# Patient Record
Sex: Male | Born: 1955 | Race: White | Hispanic: No | Marital: Married | Smoking: Never smoker
Health system: Southern US, Academic
[De-identification: ages and names within clinical notes are randomized; demographics above are authoritative.]

## PROBLEM LIST (undated history)

## (undated) DIAGNOSIS — K760 Fatty (change of) liver, not elsewhere classified: Secondary | ICD-10-CM

## (undated) DIAGNOSIS — K219 Gastro-esophageal reflux disease without esophagitis: Secondary | ICD-10-CM

## (undated) DIAGNOSIS — R42 Dizziness and giddiness: Secondary | ICD-10-CM

## (undated) DIAGNOSIS — E785 Hyperlipidemia, unspecified: Secondary | ICD-10-CM

## (undated) DIAGNOSIS — N400 Enlarged prostate without lower urinary tract symptoms: Secondary | ICD-10-CM

## (undated) DIAGNOSIS — K7460 Unspecified cirrhosis of liver: Secondary | ICD-10-CM

## (undated) DIAGNOSIS — C801 Malignant (primary) neoplasm, unspecified: Secondary | ICD-10-CM

## (undated) DIAGNOSIS — N411 Chronic prostatitis: Secondary | ICD-10-CM

## (undated) DIAGNOSIS — E669 Obesity, unspecified: Secondary | ICD-10-CM

## (undated) DIAGNOSIS — N4340 Spermatocele of epididymis, unspecified: Secondary | ICD-10-CM

## (undated) DIAGNOSIS — R55 Syncope and collapse: Secondary | ICD-10-CM

## (undated) DIAGNOSIS — E119 Type 2 diabetes mellitus without complications: Secondary | ICD-10-CM

## (undated) DIAGNOSIS — D696 Thrombocytopenia, unspecified: Secondary | ICD-10-CM

## (undated) DIAGNOSIS — Z9989 Dependence on other enabling machines and devices: Secondary | ICD-10-CM

## (undated) DIAGNOSIS — G4730 Sleep apnea, unspecified: Secondary | ICD-10-CM

## (undated) DIAGNOSIS — I10 Essential (primary) hypertension: Secondary | ICD-10-CM

## (undated) DIAGNOSIS — G20A1 Parkinson's disease without dyskinesia, without mention of fluctuations: Secondary | ICD-10-CM

## (undated) HISTORY — DX: Parkinson's disease without dyskinesia, without mention of fluctuations: G20.A1

## (undated) HISTORY — PX: HX HAND SURGERY: 2100001299

## (undated) HISTORY — PX: HX COLONOSCOPY: 2100001147

## (undated) HISTORY — DX: Unspecified cirrhosis of liver: K74.60

## (undated) HISTORY — DX: Gastro-esophageal reflux disease without esophagitis: K21.9

## (undated) HISTORY — DX: Benign prostatic hyperplasia without lower urinary tract symptoms: N40.0

## (undated) HISTORY — DX: Thrombocytopenia, unspecified: D69.6

## (undated) HISTORY — DX: Fatty (change of) liver, not elsewhere classified: K76.0

## (undated) HISTORY — PX: HX HERNIA REPAIR: SHX51

## (undated) HISTORY — DX: Spermatocele of epididymis, unspecified: N43.40

## (undated) HISTORY — PX: CARPAL TUNNEL RELEASE: SHX101

## (undated) HISTORY — DX: Type 2 diabetes mellitus without complications: E11.9

## (undated) HISTORY — PX: TONSILLECTOMY: SUR1361

## (undated) HISTORY — DX: Obesity, unspecified: E66.9

## (undated) HISTORY — PX: MOUTH SURGERY: SHX715

## (undated) HISTORY — PX: BASAL CELL CARCINOMA EXCISION: SHX1214

## (undated) HISTORY — PX: HERNIA REPAIR: SHX51

## (undated) HISTORY — DX: Malignant (primary) neoplasm, unspecified: C80.1

## (undated) HISTORY — DX: Chronic prostatitis: N41.1

## (undated) HISTORY — DX: Syncope and collapse: R55

---

## 2005-12-03 ENCOUNTER — Ambulatory Visit (HOSPITAL_BASED_OUTPATIENT_CLINIC_OR_DEPARTMENT_OTHER): Admission: RE | Admit: 2005-12-03 | Discharge: 2005-12-03 | Payer: Self-pay | Admitting: Orthopedic Surgery

## 2006-02-04 ENCOUNTER — Ambulatory Visit (HOSPITAL_BASED_OUTPATIENT_CLINIC_OR_DEPARTMENT_OTHER): Admission: RE | Admit: 2006-02-04 | Discharge: 2006-02-04 | Payer: Self-pay | Admitting: Orthopedic Surgery

## 2007-08-13 ENCOUNTER — Emergency Department (HOSPITAL_COMMUNITY): Admission: EM | Admit: 2007-08-13 | Discharge: 2007-08-13 | Payer: Self-pay | Admitting: Emergency Medicine

## 2011-03-13 NOTE — Op Note (Signed)
NAMEMILO, SCHREIER                ACCOUNT NO.:  [id number]   MEDICAL RECORD NO.:  [id number]          PATIENT TYPE:  AMB   LOCATION:  DSC                          FACILITY:  [facility]   PHYSICIAN:  Cindee Salt, M.D.       DATE OF BIRTH:  Feb 20, 1956   DATE OF PROCEDURE:  12/03/2005  DATE OF DISCHARGE:                                 OPERATIVE REPORT   PREOPERATIVE DIAGNOSIS:  Carpal tunnel syndrome left hand.   POSTOPERATIVE DIAGNOSIS:  Carpal tunnel syndrome left hand.   OPERATION:  Decompression left median nerve.   SURGEON:  Kuzma   ASSISTANT:  Inman R.N.   ANESTHESIA:  Forearm based IV regional.   HISTORY:  The patient is a 55 year old male with a history of carpal tunnel  syndrome EMG nerve conductions positive, not responsive to conservative  treatment.   PROCEDURE:  The patient is brought to the operating room after discussion,  marking the extremity by both the patient and surgeon. A forearm based IV  regional anesthetic was carried out without difficulty. He was prepped using  DuraPrep, supine position, left arm free. A longitudinal incision was made  in the palm, carried down through subcutaneous tissue. Bleeders were  electrocauterized, palmar fascia was split, superficial palmar arch  identified, flexor tendon of the ring and little finger identified to the  ulnar side of median nerve. Carpal retinaculum was incised with sharp  dissection. A right angle and Sewall retractor placed between skin and  forearm fascia. The fascia was released for approximately a centimeter and a  half proximal to the wrist crease under direct vision. Canal was explored.  Tenosynovial tissue was thickened. Area of compression to the nerve was  apparent. No further lesions were identified. The wound was irrigated. The  skin was closed interrupted 5-0 nylon sutures.  A sterile compressive  dressing and splint was applied. The patient tolerated the procedure well  was taken to the recovery  observation in satisfactory condition. He is  discharged home to return to the [facility] in 1 week on  Vicodin.           ______________________________  Cindee Salt, M.D.     GK/MEDQ  D:  12/03/2005  T:  12/03/2005  Job:  [id number]

## 2011-03-13 NOTE — Op Note (Signed)
NAMECAROLOS, FECHER                ACCOUNT NO.:  [id number]   MEDICAL RECORD NO.:  [id number]          PATIENT TYPE:  AMB   LOCATION:  DSC                          FACILITY:  [facility]   PHYSICIAN:  Cindee Salt, M.D.       DATE OF BIRTH:  26-Sep-1956   DATE OF PROCEDURE:  02/04/2006  DATE OF DISCHARGE:                                 OPERATIVE REPORT   PREOPERATIVE DIAGNOSIS:  Carpal tunnel syndrome right hand.   POSTOPERATIVE DIAGNOSIS:  Carpal tunnel syndrome right hand.   OPERATION:  Decompression, right median nerve.   SURGEON:  Cindee Salt, M.D.   ASSISTANT:  None.   ANESTHESIA:  Forearm-based IV regional.   HISTORY:  The patient is a 55 year old male with a history of carpal tunnel  syndrome.  EMG and nerve conductions positive.  This has not responded to  conservative treatment.  He is aware of risks and complications of surgery.  Questions were answered in the preoperative area.  The arm marked by patient  and surgeon.   PROCEDURE:  The patient was brought to the operating room where a forearm-  based IV regional anesthetic was carried out without difficulty.  He was  prepped using DuraPrep, supine position, right arm free.  A longitudinal  incision was made in the palm and carried down through subcutaneous tissue.  Bleeders were electrocauterized.  Palmar fascia was split.  Superficial  palmar arch identified.  The flexor tendon to the ring and little finger  identified.  To the ulnar side of median nerve, the carpal retinaculum was  incised with sharp dissection.  Right angle and Sewall retractor were placed  between skin and forearm fascia.  Fascia was released for approximately a  centimeter and a half proximal to the wrist crease under direct vision.  The  canal was explored and this was found be extremely tight.  No further  lesions were identified.  Tenosynovial tissue was moderately thickened. The  area of compression of the nerve was apparent.  The wound was  irrigated.  Skin was then closed with interrupted 5-0 nylon sutures.  Sterile  compressive dressing and splint was applied.  The patient tolerated the  procedure well was taken to the recovery room for observation in  satisfactory condition.   DISCHARGE SUMMARY:  He is discharged home to return to the [facility] in 1 week on Vicodin.           ______________________________  Cindee Salt, M.D.     GK/MEDQ  D:  02/04/2006  T:  02/04/2006  Job:  [id number]

## 2011-05-08 ENCOUNTER — Emergency Department (HOSPITAL_BASED_OUTPATIENT_CLINIC_OR_DEPARTMENT_OTHER)
Admission: EM | Admit: 2011-05-08 | Discharge: 2011-05-09 | Disposition: A | Discharge status: Other Acute Inpatient Hospital | Payer: BC Managed Care – PPO | Source: Home / Self Care | Attending: Emergency Medicine | Admitting: Emergency Medicine

## 2011-05-08 ENCOUNTER — Encounter: Payer: Self-pay | Admitting: *Deleted

## 2011-05-08 DIAGNOSIS — W07XXXA Fall from chair, initial encounter: Secondary | ICD-10-CM | POA: Insufficient documentation

## 2011-05-08 DIAGNOSIS — S1093XA Contusion of unspecified part of neck, initial encounter: Secondary | ICD-10-CM | POA: Insufficient documentation

## 2011-05-08 DIAGNOSIS — R404 Transient alteration of awareness: Secondary | ICD-10-CM | POA: Insufficient documentation

## 2011-05-08 DIAGNOSIS — S0003XA Contusion of scalp, initial encounter: Secondary | ICD-10-CM | POA: Insufficient documentation

## 2011-05-08 DIAGNOSIS — Y92009 Unspecified place in unspecified non-institutional (private) residence as the place of occurrence of the external cause: Secondary | ICD-10-CM | POA: Insufficient documentation

## 2011-05-08 DIAGNOSIS — R55 Syncope and collapse: Secondary | ICD-10-CM

## 2011-05-08 DIAGNOSIS — S0120XA Unspecified open wound of nose, initial encounter: Secondary | ICD-10-CM | POA: Insufficient documentation

## 2011-05-08 DIAGNOSIS — IMO0002 Reserved for concepts with insufficient information to code with codable children: Secondary | ICD-10-CM | POA: Insufficient documentation

## 2011-05-08 HISTORY — DX: Hyperlipidemia, unspecified: E78.5

## 2011-05-08 NOTE — ED Notes (Signed)
Pt c/o syncopal episode from sitting with laceration to nose.

## 2011-05-08 NOTE — ED Provider Notes (Signed)
History     Chief Complaint  Patient presents with  . Loss of Consciousness  . Laceration   HPI Comments: Patient presents with complaint of syncope. He was at home on the computer when he had acute onset of laughing it is something that he was watching on the computer screen. This was followed I find himself on the floor with a cut on his nose and some blood on the floor. He does admit to a history of having cough syncope which has happened at least 2 times in the past. His wife has accompanied him to the emergency department and states that she was in the room with 10 seconds of the sound of him falling. She found him awake and trying to get onto the bed. He had normal mental status no headache and no prodromal symptoms. This includes no fevers, chills, nausea, vomiting, chest pain, palpitations, shortness of breath, swelling, rash, numbness, weakness. He denies starting any new medications. 6 it was acute in onset, severe, resolved completely in a spontaneous fashion. He does note he has a small superficial laceration to bridge of his nose and an abrasion to his left forehead. He has not been worked up for syncope in the past by his family doctor.   Past Medical History  Diagnosis Date  . Hyperlipidemia     Past Surgical History  Procedure Date  . Hernia repair   . Tonsillectomy   . Carpal tunnel release     History reviewed. No pertinent family history.  History  Substance Use Topics  . Smoking status: Never Smoker   . Smokeless tobacco: Not on file  . Alcohol Use: No      Review of Systems  All other systems reviewed and are negative.    Physical Exam  BP 129/80  Pulse 88  Temp(Src) 98.5 F (36.9 C) (Oral)  Resp 18  Wt 250 lb (113.399 kg)  SpO2 100%  Physical Exam  Nursing note and vitals reviewed. Constitutional: He appears well-developed and well-nourished. No distress.  HENT:  Head: Normocephalic and atraumatic.  Mouth/Throat: Oropharynx is clear and  moist. No oropharyngeal exudate.       Superficial laceration to the bridge of the nose without gaping. Abrasion and small contusion to the left for head.  Eyes: Conjunctivae and EOM are normal. Pupils are equal, round, and reactive to light. Right eye exhibits no discharge. Left eye exhibits no discharge. No scleral icterus.       Normal pupils and extraocular movements.  Neck: Normal range of motion. Neck supple. No JVD present. No thyromegaly present.  Cardiovascular: Normal rate, regular rhythm, normal heart sounds and intact distal pulses.  Exam reveals no gallop and no friction rub.   No murmur heard. Pulmonary/Chest: Effort normal and breath sounds normal. No respiratory distress. He has no wheezes. He has no rales.  Abdominal: Soft. Bowel sounds are normal. He exhibits no distension and no mass. There is no tenderness.  Musculoskeletal: Normal range of motion. He exhibits no edema and no tenderness.  Lymphadenopathy:    He has no cervical adenopathy.  Neurological: He is alert. Coordination normal.       Normal sensation, speech, motor, coordination, gait.  Skin: Skin is warm and dry. No rash noted. He is not diaphoretic. No erythema.  Psychiatric: He has a normal mood and affect. His behavior is normal.    ED Course  LACERATION REPAIR Performed by: Vida Roller Authorized by: Eber Hong D Consent: Verbal consent obtained.  Risks and benefits: risks, benefits and alternatives were discussed Consent given by: patient Location: bridge of nose. Wound length (cm): 0.5 cm. Contaminated: No. Foreign bodies: no foreign bodies Nerve involvement: none Vascular damage: no Anesthesia method: None. Patient sedated: no Irrigation solution: saline Irrigation method: syringe Amount of cleaning: standard Debridement: none Degree of undermining: none Skin closure: glue (Dermabond) Approximation: close Approximation difficulty: simple Patient tolerance: Patient tolerated the  procedure well with no immediate complications.    MDM Patient had acute onset of what appears to be syncope which was situational. He has history of same. He has not been worked up for any sources of syncope in the past, thus I think it exceeded the work up other sources at this time including cardiac electrolytes and hematologic. Labs pending, EKG normal. Will repair nose with Dermabond only.  ED ECG REPORT   Date: 05/09/2011 - 22:26 pm  Rate: 75  Rhythm: normal sinus rhythm  QRS Axis: normal  Intervals: normal  ST/T Wave abnormalities: normal  Conduction Disutrbances:none  Narrative Interpretation:   Old EKG Reviewed: none available  Patient has an elevated CK-MB 12.5. His total CK is 890. Troponin is not detected. Will repeat these blood tests as patient has normal EKG. He is currently asymptomatic at this time.   Date: 05/09/2011 4:01 AM   Rate: 73  Rhythm: normal sinus rhythm  QRS Axis: normal  Intervals: normal  ST/T Wave abnormalities: T wave inversions in the inferior leads 3 and F.  Conduction Disutrbances:none  Narrative Interpretation:   Old EKG Reviewed: changes noted prior EKG showed no T-wave inversions.  Due to the change in EKG, will admit to the hospital for further evaluation in light of syncope. Troponins have been negative x2.  I have discussed the findings with the triad hospitalist who has agreed with admission. They have except to the patient in transfer to a cardiac monitored bed at [facility]. [facility] call for transport service.

## 2011-05-09 ENCOUNTER — Inpatient Hospital Stay (HOSPITAL_COMMUNITY): Payer: BC Managed Care – PPO

## 2011-05-09 ENCOUNTER — Encounter (HOSPITAL_BASED_OUTPATIENT_CLINIC_OR_DEPARTMENT_OTHER): Payer: Self-pay | Admitting: Emergency Medicine

## 2011-05-09 ENCOUNTER — Observation Stay (HOSPITAL_COMMUNITY)
Admission: AD | Admit: 2011-05-09 | Discharge: 2011-05-10 | DRG: 543 | Disposition: A | Discharge status: Home / Self Care | Payer: BC Managed Care – PPO | Source: Other Acute Inpatient Hospital | Attending: Internal Medicine | Admitting: Internal Medicine

## 2011-05-09 ENCOUNTER — Other Ambulatory Visit: Payer: Self-pay

## 2011-05-09 DIAGNOSIS — R7309 Other abnormal glucose: Secondary | ICD-10-CM | POA: Insufficient documentation

## 2011-05-09 DIAGNOSIS — W19XXXA Unspecified fall, initial encounter: Secondary | ICD-10-CM | POA: Insufficient documentation

## 2011-05-09 DIAGNOSIS — E785 Hyperlipidemia, unspecified: Secondary | ICD-10-CM | POA: Insufficient documentation

## 2011-05-09 DIAGNOSIS — Z01812 Encounter for preprocedural laboratory examination: Secondary | ICD-10-CM | POA: Insufficient documentation

## 2011-05-09 DIAGNOSIS — R55 Syncope and collapse: Principal | ICD-10-CM | POA: Insufficient documentation

## 2011-05-09 DIAGNOSIS — Y92009 Unspecified place in unspecified non-institutional (private) residence as the place of occurrence of the external cause: Secondary | ICD-10-CM | POA: Insufficient documentation

## 2011-05-09 DIAGNOSIS — Y998 Other external cause status: Secondary | ICD-10-CM | POA: Insufficient documentation

## 2011-05-09 DIAGNOSIS — Z01810 Encounter for preprocedural cardiovascular examination: Secondary | ICD-10-CM | POA: Insufficient documentation

## 2011-05-09 DIAGNOSIS — K219 Gastro-esophageal reflux disease without esophagitis: Secondary | ICD-10-CM | POA: Insufficient documentation

## 2011-05-09 DIAGNOSIS — R0789 Other chest pain: Secondary | ICD-10-CM | POA: Insufficient documentation

## 2011-05-09 DIAGNOSIS — S0120XA Unspecified open wound of nose, initial encounter: Secondary | ICD-10-CM | POA: Insufficient documentation

## 2011-05-09 DIAGNOSIS — M6282 Rhabdomyolysis: Secondary | ICD-10-CM | POA: Insufficient documentation

## 2011-05-09 LAB — TROPONIN I
Troponin I: <0.3 ng/mL (ref <0.30)
Troponin I: <0.3 ng/mL (ref <0.30)

## 2011-05-09 LAB — TSH: TSH: 1.987 u[IU]/mL (ref 0.350–4.500)

## 2011-05-09 LAB — CK TOTAL AND CKMB (NOT AT ARMC)
CK, MB: 12.5 ng/mL (ref 0.3–4.0)
CK, MB: 12.5 ng/mL (ref 0.3–4.0)
Relative Index: 1.4 (ref 0.0–2.5)
Relative Index: 1.5 (ref 0.0–2.5)
Total CK: 890 U/L — ABNORMAL HIGH (ref 7–232)
Total CK: 817 U/L — ABNORMAL HIGH (ref 7–232)

## 2011-05-09 LAB — BASIC METABOLIC PANEL
BUN: 18 mg/dL (ref 6–23)
CO2: 23 mEq/L (ref 19–32)
Calcium: 9.2 mg/dL (ref 8.4–10.5)
Chloride: 102 mEq/L (ref 96–112)
Creatinine, Ser: 0.7 mg/dL (ref 0.50–1.35)
GFR calc Af Amer: >60 mL/min (ref >60)
GFR calc non Af Amer: >60 mL/min (ref >60)
Glucose, Bld: 114 mg/dL — ABNORMAL HIGH (ref 70–99)
Potassium: 3.7 mEq/L (ref 3.5–5.1)
Sodium: 137 mEq/L (ref 135–145)

## 2011-05-09 LAB — CBC
HCT: 39.8 % (ref 39.0–52.0)
Hemoglobin: 14.2 g/dL (ref 13.0–17.0)
MCH: 34.1 pg — ABNORMAL HIGH (ref 26.0–34.0)
MCHC: 35.7 g/dL (ref 30.0–36.0)
MCV: 95.4 fL (ref 78.0–100.0)
Platelets: 125 10*3/uL — ABNORMAL LOW (ref 150–400)
RBC: 4.17 MIL/uL — ABNORMAL LOW (ref 4.22–5.81)
RDW: 12.8 % (ref 11.5–15.5)
WBC: 7.1 10*3/uL (ref 4.0–10.5)

## 2011-05-09 LAB — CARDIAC PANEL(CRET KIN+CKTOT+MB+TROPI)
CK, MB: 11.5 ng/mL (ref 0.3–4.0)
CK, MB: 10.9 ng/mL (ref 0.3–4.0)
Relative Index: 1.5 (ref 0.0–2.5)
Relative Index: 1.5 (ref 0.0–2.5)
Total CK: 765 U/L — ABNORMAL HIGH (ref 7–232)
Total CK: 727 U/L — ABNORMAL HIGH (ref 7–232)
Troponin I: <0.3 ng/mL (ref <0.30)
Troponin I: <0.3 ng/mL (ref <0.30)

## 2011-05-09 LAB — LIPID PANEL
Cholesterol: 163 mg/dL (ref 0–200)
HDL: 38 mg/dL — ABNORMAL LOW (ref >39)
LDL Cholesterol: 90 mg/dL (ref 0–99)
Total CHOL/HDL Ratio: 4.3 RATIO
Triglycerides: 173 mg/dL — ABNORMAL HIGH (ref <150)
VLDL: 35 mg/dL (ref 0–40)

## 2011-05-09 LAB — T4, FREE: Free T4: 0.94 ng/dL (ref 0.80–1.80)

## 2011-05-09 MED ORDER — IOHEXOL 300 MG/ML  SOLN
100.0000 mL | Freq: Once | INTRAMUSCULAR | Status: AC | PRN
  Administered 2011-05-09: 100 mL via INTRAVENOUS

## 2011-05-09 MED ORDER — ASPIRIN 81 MG PO CHEW
324.0000 mg | CHEWABLE_TABLET | Freq: Once | ORAL | Status: AC
  Administered 2011-05-09: 324 mg via ORAL

## 2011-05-09 MED ORDER — ASPIRIN 81 MG PO CHEW
CHEWABLE_TABLET | ORAL | Status: AC
  Administered 2011-05-09: 324 mg via ORAL
  Filled 2011-05-09: qty 4

## 2011-05-10 ENCOUNTER — Inpatient Hospital Stay (HOSPITAL_COMMUNITY): Payer: BC Managed Care – PPO

## 2011-05-10 DIAGNOSIS — R55 Syncope and collapse: Secondary | ICD-10-CM

## 2011-05-10 DIAGNOSIS — R079 Chest pain, unspecified: Secondary | ICD-10-CM

## 2011-05-10 LAB — CARDIAC PANEL(CRET KIN+CKTOT+MB+TROPI)
CK, MB: 8.3 ng/mL (ref 0.3–4.0)
Relative Index: 1.4 (ref 0.0–2.5)
Total CK: 585 U/L — ABNORMAL HIGH (ref 7–232)
Troponin I: <0.3 ng/mL (ref <0.30)

## 2011-05-10 LAB — HEMOGLOBIN A1C
Hgb A1c MFr Bld: 6.2 % — ABNORMAL HIGH (ref <5.7)
Mean Plasma Glucose: 131 mg/dL — ABNORMAL HIGH (ref <117)

## 2011-05-10 MED ORDER — TECHNETIUM TC 99M TETROFOSMIN IV KIT
10.0000 | PACK | Freq: Once | INTRAVENOUS | Status: AC | PRN
  Administered 2011-05-10: 10 via INTRAVENOUS

## 2011-05-10 MED ORDER — TECHNETIUM TC 99M TETROFOSMIN IV KIT
30.0000 | PACK | Freq: Once | INTRAVENOUS | Status: AC | PRN
  Administered 2011-05-10: 30 via INTRAVENOUS

## 2011-05-11 ENCOUNTER — Other Ambulatory Visit (HOSPITAL_COMMUNITY): Payer: BC Managed Care – PPO

## 2011-05-18 NOTE — Consult Note (Signed)
NAMEMICAI, APOLINAR NO.:  [id number]  MEDICAL RECORD NO.:  [id number]  LOCATION:  2019                         FACILITY:  [facility]  PHYSICIAN:  Noralyn Pick. Eden Emms, MD, FACCDATE OF BIRTH:  [birth year]  DATE OF CONSULTATION: DATE OF DISCHARGE:                                CONSULTATION   A 55 year old patient we were asked to see for syncope.  The patient has a longstanding history of cough and syncope.  He has had at least 5 or 6 episodes where he has had uncontrolling coughing and passed out.  He was evaluated 8 or 9 years ago by a cardiologist.  He does not remember their name.  He was followed for about 2 years with diagnostic testing and no documented coronary artery disease or cardiomyopathy.  He is fairly sedentary.  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  He apparently was watching a video clip on YouTube and started to laugh fairly hard and uncontrollably.  He subsequently had syncope.  He hit his head and left side of his face fairly hard.  He denies __________ warning.  He denies chest pain, palpitations or shortness of breath.  He was seen in an outside urgent care facility and transferred to [facility]. His CPK was elevated at 817 but had a relative index of only 1.5 and his troponin was negative.  So far here in the hospital his telemetry has been benign.  The patient denies a history of chest pain, palpitations or syncope outside of the setting of uncontrolled laughter or coughing.  The patient is currently asymptomatic except for some pain on the top of his head.  CT scan was negative for acute bleed or trauma-induced fracture.  The patient's primary physician is here in [place].  He does indicate coronary risk factors including positive family history and hypercholesterolemia.  He is a nonsmoker and denies drugs.  Ten-point review of systems otherwise negative.  Family history is remarkable for premature coronary  artery disease and bypass in his father.  The patient is happily married.  He lives in [place].  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  He used to walk a lot including an [employer], but has been more sedentary the last year.  Past medical history is remarkable for hyperlipidemia and obesity.  Past surgical history is remarkable for previous basal cell carcinoma, hernia repair, tonsillectomy, carpal tunnel release, and oral surgery.  Question of allergy to SODIUM PENTOTHAL.  Medications at home include Crestor 10 a day, fenofibrate 160 a day, fish oil and coenzyme Q.  PHYSICAL EXAMINATION:  GENERAL:  Remarkable for a middle-aged male in no distress.  He is overweight.  He has a bruise over the top of his head in left malleolar area. VITAL SIGNS:  Currently blood pressure is 125/84, not postural, pulse 69- 75 irregular.  Telemetry review no heart block or arrhythmia, sats 97%. HEENT:  Unremarkable.  Carotids are without bruit.  No lymphadenopathy, thyromegaly, JVP elevation. LUNGS:  Clear, good diaphragmatic motion.  No wheezing. CARDIAC:  S1-S2, normal heart sounds.  PMI normal. ABDOMEN:  Benign.  Bowel sounds positive.  No AAA.  No tenderness.  No bruit.  No hepatosplenomegaly, hepatojugular reflux or tenderness. Distal pulses are intact. EXTREMITIES:  No edema. NEURO:  Nonfocal. SKIN:  Warm and dry.  No muscular weakness.  EKG shows T-wave inversions in III and F.  CPKs were as indicated with elevated CPK but low relative index and negative troponins.  LDL is 90, potassium 3.7, creatinine 0.7.  Here in this hospital a troponin is negative as well.  CT of the head shows no acute trauma, soft tissue swelling over the scalp.  IMPRESSION: 1. 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  I do not think that T-wave     inversions in III and F are significant and his CPKs were elevated     but with a very  low MB fraction and negative troponins.  He will     have a 2-D echocardiogram here in the hospital.  I have written him     for an exercise Myoview study tomorrow.  If this cannot be done due     to logistics, he may be a reasonable candidate for cardiac CT as     well. 2. Head trauma may need maxillary series as well, ice and p.r.n. pain     medicine.  CT scan negative. 3. Hypercholesterolemia.  LDL in the good range.  Continue current     medications.  Further recommendations be based on the results of     his echo and availability of stress Myoview testing over the     weekend.     Noralyn Pick. Eden Emms, MD, [facility]     PCN/MEDQ  D:  05/09/2011  T:  05/09/2011  Job:  [id number]  Electronically Signed by Charlton Haws MD [facility] on 05/18/2011 10:16:09 PM

## 2011-05-29 NOTE — H&P (Signed)
Mike Dunn, Mike Dunn NO.:  [id number]  MEDICAL RECORD NO.:  [id number]  LOCATION:                                 FACILITY:  PHYSICIAN:  Calvert Cantor, M.D.     DATE OF BIRTH:  November 28, 1955  DATE OF ADMISSION: DATE OF DISCHARGE:                             HISTORY & PHYSICAL   PRIMARY CARE PHYSICIAN:  [facility].  PRESENTING COMPLAINT:  Passed out.  HISTORY OF PRESENT ILLNESS:  This is a 55 year old male with hyperlipidemia and morbid obesity.  The patient was watching a video on YouTube last night when he started laughing and essentially laughed himself into passing out.  The patient fell onto the floor onto his face and injured his nose.  He woke up immediately, his wife was present when he woke up.  She does not recall noticing him having any seizure-like activity or loss of bladder or bowel control.  The patient does not recall having any chest pain or focal numbness and weakness prior to or after the episode.  The patient was taken to [facility]. He had a laceration on the bridge of his nose which was treated with Dermabond.  The patient was referred for admission.  When questioned further, the patient admits to having some chest pain on and off over the past 3 weeks.  He states he has been under a lot of stress at work and essentially has not had any days off.  He sits at a desk, therefore, his chest pain is at rest.  He describes it as sharp twinges which usually pass after a minute.  He also describes palpitations which usually last for a couple of minutes.  He does not notice any shortness of breath, dizziness, or chest pain associated with these palpitations.  When questioned, he also admits to having pedal edema lately.  PAST MEDICAL HISTORY: 1. Hyperlipidemia. 2. The patient is noted to be morbidly obese and admits to have gained     10-15 pounds in the past few months.  PAST SURGICAL HISTORY: 1. Basal cell  carcinoma, removed x2. 2. Hernia repair. 3. Tonsillectomy. 4. Carpal tunnel release. 5. Oral surgery, for which he had a hospital stay.  ALLERGIES:  He is allergic to something that is no longer used that was given to him for anesthesia many years ago known as SODIUM PENTOTHAL.  MEDICATIONS: 1. Crestor 10 mg daily. 2. Fenofibrate 160 mg daily. 3. Fish old 1000 mg daily. 4. Coenzyme Q 100 mg daily. 5. One-A-Day vitamin daily.  SOCIAL HISTORY:  The patient does not smoke.  He has never smoked.  He does not drink any alcohol.  He works as a Forensic psychologist, lives with his wife.  FAMILY HISTORY:  Positive for colon cancer, breast cancer, lung cancer. His father had a bypass and passed away with cardiac complications. Mother is alive with a pacemaker.  He has brothers with heart disease.  REVIEW OF SYSTEMS:  He has gained about 10-15 pounds in the past few months.  He does feel increased fatigue due to increased amount of work over the past 3 weeks.  No fever, chills, or sweats.  HEENT:  No change in vision.  No sore throat, sinus trouble, or earache.  No migraines or headaches.  RESPIRATORY:  Has shortness of breath on exertion.  No wheezing or cough.  CARDIAC:  Describes chest pain at rest which he describes as sharp twinges which pass away within a minute.  He also complains of occasional palpitations and pedal edema.  No orthopnea but his wife has noted that he has been snoring more frequently and occasionally does stop breathing.  GASTROINTESTINAL:  Has problems with reflux, for which he was previously taking Nexium.  Otherwise, no nausea, vomiting, diarrhea, or constipation.  GENITOURINARY:  No dysuria or hematuria.  HEMATOLOGICAL:  No easy bruising.  SKIN:  No rash. MUSCULOSKELETAL:  Has joint pain in his knees.  NEUROLOGICAL:  Has passed out in the past after coughing fits.  He has never had a stroke, a mini stroke, or seizures.  PSYCHOLOGICAL:  No anxiety or  depression.  PHYSICAL EXAMINATION:  VITAL SIGNS:  Temperature 98.3, pulse 69, respirations 19, blood pressure 125/84, oxygen saturation 97% on room air. HEENT:  Pupils equal, round, and reactive to light.  Extraocular movements are intact.  Conjunctivae pink.  No scleral icterus.  Oral mucosa is moist.  Oropharynx clear. NECK:  Supple.  No thyromegaly or lymphadenopathy. HEART:  Regular rate and rhythm.  No murmurs, rubs, or gallops. LUNGS:  Clear bilaterally with a good respiratory effort.  No use of accessory muscles. ABDOMEN:  Morbidly obese, soft, nontender, nondistended.  Bowel sounds positive. EXTREMITIES:  No cyanosis, clubbing, or edema.  Pedal pulses are faint. NEUROLOGIC:  Cranial nerves II through XII are intact.  Strength intact in all four extremities. PSYCHOLOGIC:  He is awake, alert, oriented x3.  Mood and affect normal. SKIN:  Warm, dry, a few bruises on his face and a laceration on the bridge of his nose which has been closed.  BLOOD WORK:  CBC is essentially normal except for a platelet count which is low at 125.  Metabolic panel reveals a glucose which is elevated at 114.  We have one set of cardiac markers back which reveal a CK of 817, CK-MB is 12.5, index is 1.5, troponin is less than 0.30.  EKGs:  The patient has had three EKGs at [facility].  Initial revealed normal sinus rhythm without any abnormalities.  Repeat EKG reveals inverted T-waves in lead III only.  ASSESSMENT AND PLAN: 1. Syncopal episode after laughing.  The patient has had syncope twice     in the past after coughing fit.  However, due to noted EKG changes,     he was referred for admission.  At this time, workup will include a     CT of the head, carotid Dopplers, 2-D echo, a repeat EKG and also a     Cardiology consult.  We will also be getting orthostatic vitals on     him and watching him on telemetry. 2. Atypical chest pain and palpitations with pedal edema.  We will be      getting an echo and Cardiology consult as mentioned above. 3. Mild rhabdomyolysis, likely due to muscle injury after syncope.  We     will hydrate him slowly with normal saline and follow his CKs. 4. Gastroesophageal reflux disease.  If this is a problem during     hospital stay, we will treat him with a PPI. 5. Increased snoring with episodes where he stops breathing.  I have     recommended that  he talk to his PCP about the sleep study if this     issue continues 6. Hyperlipidemia.  We will get a fasting lipid profile. 7. Mild hyperglycemia.  The patient may very well have underlying     diabetes.  We will get a hemoglobin A1c. 8. Morbid obesity.  He has been advised to lose weight.  We will get     dietary counseling for him. 9. Deep venous thrombosis prophylaxis will be with Lovenox.  TIME ON ADMISSION:  55 minutes.     Calvert Cantor, M.D.     SR/MEDQ  D:  05/09/2011  T:  05/09/2011  Job:  [id number]  Electronically Signed by Calvert Cantor M.D. on 05/29/2011 08:38:39 AM

## 2011-06-04 NOTE — Discharge Summary (Signed)
NAMEDELQUAN, POUCHER NO.:  [id number]  MEDICAL RECORD NO.:  [id number]  LOCATION:  2019                         FACILITY:  [facility]  PHYSICIAN:  Calvert Cantor, M.D.     DATE OF BIRTH:  08-Jun-1956  DATE OF ADMISSION:  05/09/2011 DATE OF DISCHARGE:  05/10/2011                              DISCHARGE SUMMARY   PRIMARY CARE PHYSICIAN:  [facility].  PRESENTING COMPLAINT:  Passed out.  DISCHARGE DIAGNOSES: 1. Syncope, most likely vasovagal. 2. Hypertriglyceridemia. 3. Rhabdomyolysis. 4. Possible sleep apnea.  Will need further workup. 5. Laceration to the bridge of nose secondary to trauma from syncope. 6. Pedal edema, likely due to venous stasis. 7. Glucose intolerance.  PAST MEDICAL HISTORY:  Hyperlipidemia and morbid obesity with continued increasing weight gain.  DISCHARGE MEDICATIONS:  New medication, hydrocodone/acetaminophen 5/325 one to two tablets q. 4 hours as needed for moderate-to-severe pain. Continue on the following, 1. Aspirin 81 mg daily. 2. Coenzyme Q over-the-counter 1 tablet daily. 3. Crestor, unknown dose one tablet daily. 4. Fenofibrate 160 mg daily. 5. Fish oil over-the-counter 1 capsule daily.  CONSULTS:  Cardiology consult.  The patient was evaluated by Dr. Eden Emms.  IMAGING RESULTS:  CT scan of the head with and without contrast on May 09, 2011, revealed scalp swelling posteriorly, it was otherwise negative.  Maxillofacial CT, May 10, 2011, revealed mild subcutaneous soft tissue swelling along the left nasal cavity with 5 mm of focal leftward nasal septal deviation identified.  There was mild subcutaneous swelling in the vicinity of the mesial noted, without underlying fracture. Paranasal sinuses appears Clear.  No facial fracture was identified.  Nuclear stress test did not reveal any exercising-induced myocardial reversibility.  Normal LV function was noted.  EF was 63%.  Carotid duplex did not reveal any ICA  stenosis.  Vertebral flow was noted to be antegrade.  PERTINENT LAB FINDINGS:  Hemoglobin A1c was 6.2, cholesterol 163, triglyceride 173, HDL 38, LDL 90.  Three sets of cardiac enzymes revealed negative troponin.  Thyroid function studies TSH was 1.987, free T4 of 0.94.  Blood work within normal range.  HOSPITAL COURSE:  This is a 55 year old male, who was watching a video on you tube and started to laugh and eventually became unconscious for this.  He hit his nose and sustained a laceration to the bridge of his nose which was repaired at [facility].  There was no seizure- like activity noted.  The patient has admitted in the past that he has passed out from coughing spells as well.  He was referred for admission due to EKG changes.  He had 3 EKGs performed at [facility].  Initial revealed a sinus rhythm without any abnormalities.  However, repeat EKG revealed T-wave inversions in lead III.  The patient did admit to having on and off chest pain over the past few weeks, which he described as sharp.  He also admitted to having palpitations and pedal edema.  When he presented, he was not noted to have much pedal edema.  Above-mentioned workup was done including a stress test.  It is assumed that the patient's syncope was vasovagal.  The patient was  hydrated for his rhabdomyolysis with improvement noted. CK on admission was 817 and has improved to 585.  Due to the patient's noted obesity, a Nutrition consult was requested and the patient was advised on the appropriate nutrition for losing weight, and also had education in regards to diabetic diet, low fat and low-sodium diet.  At this point with a hemoglobin A1c of 6.2, I have not started any medications for him, but I have advised that he lose weight and adhere to a normal diabetic diet.  His wife had noted that the patient had been snoring more often and at times stops breathing and therefore it is  recommended that he will be evaluated with sleep study.  PHYSICAL EXAMINATION:  On discharge, HEENT:  The patient's laceration was treated with Dermabond and is healing up well. LUNGS:  Clear bilaterally. HEART:  Regular rate and rhythm.  No murmurs. EXTREMITIES:  No cyanosis, clubbing, or edema.  CONDITION ON DISCHARGE:  Stable.  FOLLOWUP INSTRUCTIONS:  He is advised to follow up with his primary care physician at [facility] in 1-2 weeks.  Time on patient's care was 55 minutes.     Calvert Cantor, M.D.     SR/MEDQ  D:  05/30/2011  T:  05/30/2011  Job:  [id number]  Electronically Signed by Calvert Cantor M.D. on 06/04/2011 02:04:17 PM

## 2012-01-21 DIAGNOSIS — G5712 Meralgia paresthetica, left lower limb: Secondary | ICD-10-CM | POA: Insufficient documentation

## 2012-10-26 DIAGNOSIS — N4340 Spermatocele of epididymis, unspecified: Secondary | ICD-10-CM

## 2012-10-26 HISTORY — DX: Spermatocele of epididymis, unspecified: N43.40

## 2014-07-03 ENCOUNTER — Encounter: Payer: BC Managed Care – PPO | Attending: Family Medicine

## 2014-07-03 VITALS — Ht 68.5 in | Wt 244.9 lb

## 2014-07-03 DIAGNOSIS — E119 Type 2 diabetes mellitus without complications: Secondary | ICD-10-CM

## 2014-07-03 DIAGNOSIS — Z713 Dietary counseling and surveillance: Secondary | ICD-10-CM | POA: Diagnosis not present

## 2014-07-05 NOTE — Progress Notes (Signed)
Patient was seen on 07-03-14 for the first of a series of three diabetes self-management courses at the [facility].  Patient Education Plan per assessed needs and concerns is to attend four course education program for Diabetes Self Management Education.  Current HbA1c: 6.6  The following learning objectives were met by the patient during this class:  Describe diabetes  State some common risk factors for diabetes  Defines the role of glucose and insulin  Identifies type of diabetes and pathophysiology  Describe the relationship between diabetes and cardiovascular risk  State the members of the Healthcare Team  States the rationale for glucose monitoring  State when to test glucose  State their individual Target Range  State the importance of logging glucose readings  Describe how to interpret glucose readings  Identifies A1C target  Explain the correlation between A1c and eAG values  State symptoms and treatment of high blood glucose  State symptoms and treatment of low blood glucose  Explain proper technique for glucose testing  Identifies proper sharps disposal  Handouts given during class include:  Living Well with Diabetes book  Carb Counting and Meal Planning book  Meal Plan Card  Carbohydrate guide  Meal planning worksheet  Low Sodium Flavoring Tips  The diabetes portion plate  J1O to eAG Conversion Chart  Diabetes Medications  Diabetes Recommended Care Schedule  Support Group  Diabetes Success Plan  Core Class Satisfaction Survey  Follow-Up Plan:  Attend core 2

## 2014-07-10 DIAGNOSIS — E119 Type 2 diabetes mellitus without complications: Secondary | ICD-10-CM

## 2014-07-10 NOTE — Progress Notes (Signed)

## 2014-07-17 DIAGNOSIS — E119 Type 2 diabetes mellitus without complications: Secondary | ICD-10-CM | POA: Diagnosis not present

## 2014-07-18 NOTE — Progress Notes (Signed)
Patient was seen on 07/17/2014 for the third of a series of three diabetes self-management courses at the [facility]. The following learning objectives were met by the patient during this class:    State the amount of activity recommended for healthy living   Describe activities suitable for individual needs   Identify ways to regularly incorporate activity into daily life   Identify barriers to activity and ways to over come these barriers  Identify diabetes medications being personally used and their primary action for lowering glucose and possible side effects   Describe role of stress on blood glucose and develop strategies to address psychosocial issues   Identify diabetes complications and ways to prevent them  Explain how to manage diabetes during illness   Evaluate success in meeting personal goal   Establish 2-3 goals that they will plan to diligently work on until they return for the  38-monthfollow-up visit  Goals:   I will count my carb choices at most meals and snacks  I will be active  3 times a week  To help manage stress I will  exercise at least 3 times a week  Your patient has identified these potential barriers to change:  Stress  Your patient has identified their diabetes self-care support plan as  On-line Resources Plan:  Attend Core 4 in 4 months

## 2014-10-11 ENCOUNTER — Ambulatory Visit
Admission: RE | Admit: 2014-10-11 | Discharge: 2014-10-11 | Disposition: A | Discharge status: Home / Self Care | Payer: BC Managed Care – PPO | Source: Ambulatory Visit | Attending: Family Medicine | Admitting: Family Medicine

## 2014-10-11 ENCOUNTER — Other Ambulatory Visit: Payer: Self-pay | Admitting: Family Medicine

## 2014-10-11 DIAGNOSIS — M542 Cervicalgia: Secondary | ICD-10-CM

## 2014-10-29 ENCOUNTER — Ambulatory Visit: Payer: BC Managed Care – PPO

## 2015-01-21 ENCOUNTER — Other Ambulatory Visit: Payer: Self-pay | Admitting: Gastroenterology

## 2015-01-21 DIAGNOSIS — R109 Unspecified abdominal pain: Secondary | ICD-10-CM

## 2015-01-28 ENCOUNTER — Ambulatory Visit
Admission: RE | Admit: 2015-01-28 | Discharge: 2015-01-28 | Disposition: A | Discharge status: Home / Self Care | Payer: BLUE CROSS/BLUE SHIELD | Source: Ambulatory Visit | Attending: Gastroenterology | Admitting: Gastroenterology

## 2015-01-28 DIAGNOSIS — R109 Unspecified abdominal pain: Secondary | ICD-10-CM

## 2015-07-15 ENCOUNTER — Telehealth: Payer: Self-pay | Admitting: Hematology

## 2015-07-15 NOTE — Telephone Encounter (Signed)
New patient appt-s/w patient and gave np appt for 10/06 @ 10:45 w/Dr. Irene Limbo Referring Dr. Antony Contras Dx-Thrombocytopenia    Referral information scanned

## 2015-08-01 ENCOUNTER — Telehealth: Payer: Self-pay | Admitting: Hematology

## 2015-08-01 ENCOUNTER — Encounter: Payer: Self-pay | Admitting: Hematology

## 2015-08-01 ENCOUNTER — Other Ambulatory Visit (HOSPITAL_BASED_OUTPATIENT_CLINIC_OR_DEPARTMENT_OTHER): Payer: BLUE CROSS/BLUE SHIELD

## 2015-08-01 ENCOUNTER — Ambulatory Visit (HOSPITAL_BASED_OUTPATIENT_CLINIC_OR_DEPARTMENT_OTHER): Payer: BLUE CROSS/BLUE SHIELD | Admitting: Hematology

## 2015-08-01 VITALS — BP 136/80 | HR 89 | Temp 98.8°F | Resp 18 | Ht 68.5 in | Wt 242.0 lb

## 2015-08-01 DIAGNOSIS — R161 Splenomegaly, not elsewhere classified: Secondary | ICD-10-CM | POA: Diagnosis not present

## 2015-08-01 DIAGNOSIS — I8500 Esophageal varices without bleeding: Secondary | ICD-10-CM

## 2015-08-01 DIAGNOSIS — D696 Thrombocytopenia, unspecified: Secondary | ICD-10-CM | POA: Diagnosis not present

## 2015-08-01 DIAGNOSIS — I10 Essential (primary) hypertension: Secondary | ICD-10-CM

## 2015-08-01 DIAGNOSIS — E119 Type 2 diabetes mellitus without complications: Secondary | ICD-10-CM

## 2015-08-01 DIAGNOSIS — K7581 Nonalcoholic steatohepatitis (NASH): Secondary | ICD-10-CM

## 2015-08-01 DIAGNOSIS — E669 Obesity, unspecified: Secondary | ICD-10-CM

## 2015-08-01 DIAGNOSIS — K7460 Unspecified cirrhosis of liver: Secondary | ICD-10-CM | POA: Diagnosis not present

## 2015-08-01 DIAGNOSIS — E785 Hyperlipidemia, unspecified: Secondary | ICD-10-CM

## 2015-08-01 LAB — CBC & DIFF AND RETIC
BASO%: 0.4 % (ref 0.0–2.0)
Basophils Absolute: 0 10*3/uL (ref 0.0–0.1)
EOS%: 2.9 % (ref 0.0–7.0)
Eosinophils Absolute: 0.2 10*3/uL (ref 0.0–0.5)
HCT: 44.6 % (ref 38.4–49.9)
HGB: 15.6 g/dL (ref 13.0–17.1)
Immature Retic Fract: 5 % (ref 3.00–10.60)
LYMPH%: 23.4 % (ref 14.0–49.0)
MCH: 33.4 pg (ref 27.2–33.4)
MCHC: 35 g/dL (ref 32.0–36.0)
MCV: 95.5 fL (ref 79.3–98.0)
MONO#: 0.5 10*3/uL (ref 0.1–0.9)
MONO%: 9.1 % (ref 0.0–14.0)
NEUT#: 3.6 10*3/uL (ref 1.5–6.5)
NEUT%: 64.2 % (ref 39.0–75.0)
Platelets: 88 10*3/uL — ABNORMAL LOW (ref 140–400)
RBC: 4.67 10*6/uL (ref 4.20–5.82)
RDW: 14 % (ref 11.0–14.6)
Retic %: 1.38 % (ref 0.80–1.80)
Retic Ct Abs: 64.45 10*3/uL (ref 34.80–93.90)
WBC: 5.5 10*3/uL (ref 4.0–10.3)
lymph#: 1.3 10*3/uL (ref 0.9–3.3)

## 2015-08-01 LAB — COMPREHENSIVE METABOLIC PANEL (CC13)
ALT: 35 U/L (ref 0–55)
AST: 51 U/L — ABNORMAL HIGH (ref 5–34)
Albumin: 4.2 g/dL (ref 3.5–5.0)
Alkaline Phosphatase: 55 U/L (ref 40–150)
Anion Gap: 9 mEq/L (ref 3–11)
BUN: 13 mg/dL (ref 7.0–26.0)
CO2: 23 mEq/L (ref 22–29)
Calcium: 9.8 mg/dL (ref 8.4–10.4)
Chloride: 108 mEq/L (ref 98–109)
Creatinine: 0.8 mg/dL (ref 0.7–1.3)
EGFR: >90 mL/min/{1.73_m2} (ref >90)
Glucose: 112 mg/dl (ref 70–140)
Potassium: 3.9 mEq/L (ref 3.5–5.1)
Sodium: 140 mEq/L (ref 136–145)
Total Bilirubin: 1.37 mg/dL — ABNORMAL HIGH (ref 0.20–1.20)
Total Protein: 7.6 g/dL (ref 6.4–8.3)

## 2015-08-01 LAB — PROTIME-INR
INR: 1 — ABNORMAL LOW (ref 2.00–3.50)
Protime: 12 Seconds (ref 10.6–13.4)

## 2015-08-01 LAB — CHCC SMEAR

## 2015-08-01 NOTE — Progress Notes (Signed)
[facility]    HEMATOLOGY/ONCOLOGY CONSULTATION NOTE  Date of Service: 08/01/2015  Patient Care Team: Antony Contras, MD as PCP - General (Family Medicine)  CHIEF COMPLAINTS/PURPOSE OF CONSULTATION:  tjhrombocytopenia  HISTORY OF PRESENTING ILLNESS:  Mike Dunn is a wonderful 59 y.o. male who has been referred to [place] by Dr Moreen Fowler for evaluation and management of thrombocytopenia.  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 He has been noted to have previous colonic polyps. He has been referred for further evaluation of his thrombocytopenia. Labs show that he had a platelet count of 125k in July 2012 and a recent platelet count of 83k on 07/04/2015. His hemoglobin was 15.4 with MCV of 95 and normal WBC count of 5.1k. His hemoglobin was previously 9.7 in March 2016 at which time his platelets were 90,000. It appears that the platelets have been slowly dropping since 2012 and therefore he was sent to [place] for an evaluation.  Patient notes that he has been following up with the [facility] and has had an EGD and colonoscopy. He notes the colonoscopy showed some nonbleeding polyps which were removed and the EGD showed esophageal varices without history of drinking. 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 He has had an ultrasound of the abdomen that showed cholelithiasis, hepatic steatosis with likely superimposed parenchymal liver disease with nodularity concerning for liver cirrhosis. He was also noted to have splenomegaly with the splenic size of 14.3 x 16.1 x 6.3 cm for a splenic volume of 755 cm.  His liver disease is currently under workup and he has been following up with GI for this. He notes some intermittent mild nosebleeds. Has been on aspirin 81 mg by mouth daily. Notes  no overt GI bleeding recently/no melena/hematochezia/no hematuria. Notes that he bruises somewhat easily. No other concerning bleeds. He notes he feels well overall.   MEDICAL HISTORY:  Past Medical History  Diagnosis Date  . Hyperlipidemia   . Obesity   . Diabetes mellitus without complication   . 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 Recurrent basal cell carcinomas -face I/2011, right distal forearm 2012 History of esophageal varices No banding. Iron deficiency anemia Gastroesophageal reflux disease on PPI BPH with frequent urinary infections. 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 Hernia repair    . Tonsillectomy    . Carpal tunnel release    . Basal cell carcinoma excision      SOCIAL HISTORY: Social History   Social History  . Marital Status: Married    Spouse Name: N/A  . Number of Children: N/A  . Years of Education: N/A   Occupational History  . Not on file.   Social History Main Topics  . Smoking status: Never Smoker   . Smokeless tobacco: Not on file  . Alcohol Use: No  . Drug Use: No  . Sexual Activity: No   Other Topics Concern  . Not on file   Social History Narrative   denies excessive/significant alcohol use Patient is an Financial planner He is married and has 2 children.  FAMILY HISTORY: Father MI at age 50, dyslipidemia, coronary artery disease Other hypertension, stroke age 51 Paternal grandmother diabetes Maternal grandfather  stroke, lung cancer, smoker, coal miner Maternal grandmother hypertension Brother hypertension   ALLERGIES:  is allergic to zocor. Zocor myalgias after 2 years of use  MEDICATIONS:  Current Outpatient Prescriptions  Medication Sig Dispense Refill  . aspirin 81 MG tablet Take 81 mg by mouth  daily.      Marland Kitchen co-enzyme Q-10 50 MG capsule Take 50 mg by mouth daily.      . fish oil-omega-3 fatty acids 1000 MG capsule Take 1 g by mouth daily.      . Multiple Vitamins-Minerals (MULTI FOR HIM 50+ PO) Take 1 tablet by mouth daily.      . pantoprazole (PROTONIX) 40 MG tablet Take 40 mg by mouth daily.    . Probiotic Product (ALIGN) 4 MG CAPS Take 4 mg by mouth daily.    . rosuvastatin (CRESTOR) 10 MG tablet Take 10 mg by mouth daily.       No current facility-administered medications for this visit.    REVIEW OF SYSTEMS:    10 Point review of Systems was done is negative except as noted above.  PHYSICAL EXAMINATION: ECOG PERFORMANCE STATUS: 1 - Symptomatic but completely ambulatory  . Filed Vitals:   08/01/15 1103  Height: 5' 8.5" (1.74 m)  Weight: 242 lb (109.77 kg)   Filed Weights   08/01/15 1103  Weight: 242 lb (109.77 kg)   .Body mass index is 36.26 kg/(m^2).  GENERAL:alert, in no acute distress and comfortable SKIN: skin color, texture, turgor are normal, no rashes or significant lesions EYES: normal, conjunctiva are pink and non-injected, sclera clear OROPHARYNX:no exudate, no erythema and lips, buccal mucosa, and tongue normal  NECK: supple, no JVD, thyroid normal size, non-tender, without nodularity LYMPH:  no palpable lymphadenopathy in the cervical, axillary or inguinal LUNGS: clear to auscultation with normal respiratory effort HEART: regular rate & rhythm,  no murmurs and no lower extremity edema ABDOMEN: abdomen obese, soft, non-tender, normoactive bowel sounds , splenomegaly palpable 2-3 cm under left costal margin. Musculoskeletal: no cyanosis of digits and no clubbing  PSYCH: alert & oriented x 3 with fluent speech NEURO: no focal motor/sensory deficits  LABORATORY DATA:  I have reviewed the data as listed  . CBC Latest Ref Rng 08/01/2015 05/09/2011  WBC 4.0 - 10.3 10e3/uL 5.5 7.1  Hemoglobin 13.0 - 17.1 g/dL 15.6 14.2  Hematocrit 38.4 - 49.9 % 44.6  39.8  Platelets 140 - 400 10e3/uL 88(L) 125(L)    . CMP Latest Ref Rng 05/09/2011  Glucose 70 - 99 mg/dL 114(H)  BUN 6 - 23 mg/dL 18  Creatinine 0.50 - 1.35 mg/dL 0.70  Sodium 135 - 145 mEq/L 137  Potassium 3.5 - 5.1 mEq/L 3.7  Chloride 96 - 112 mEq/L 102  CO2 19 - 32 mEq/L 23  Calcium 8.4 - 10.5 mg/dL 9.2   Component     Latest Ref Rng 08/01/2015  Protime     10.6 - 13.4 Seconds 12.0  INR     2.00 - 3.50 1.00 (L)  Lovenox      No  APTT     24 - 37 seconds 31  Fibrinogen     204 - 475 mg/dL 305   Peripheral blood smear [personally reviewed by me]:  Decrease platelets, no platelet clumping. Normocytic RBCs. Few atypical lymphocytes. No overt left myeloid shift. No increased blasts.         RADIOGRAPHIC STUDIES: I have personally reviewed the radiological images as listed and agreed with the findings in the report.  Ultrasound of the abdomen 01/28/2015:   EXAM: ULTRASOUND ABDOMEN COMPLETE  COMPARISON: None.  FINDINGS: Gallbladder: Within the gallbladder, there are multiple echogenic foci which move and shadow consistent with gallstones. Largest individual gallstone measures 1.7 cm in length. There is no gallbladder wall thickening or pericholecystic fluid. No sonographic Murphy sign noted.  Common bile duct: Diameter: 4 mm. There is no intrahepatic, common hepatic, or common bile duct dilatation.  Liver: No focal lesion identified. Liver echogenicity is increased diffusely. Liver contour at several sites appears mildly lobular.  IVC: No abnormality visualized.  Pancreas: Visualized portion unremarkable. Portions of the pancreatic tailor obscured by gas.  Spleen: Spleen is enlarged measuring 14.3 x 16.1 x 6.3 cm with a measured splenic volume of 755 cubic cm. No focal splenic lesions are identified.  Right Kidney: Length: 14.3 cm. Echogenicity within normal limits. No mass or hydronephrosis visualized.  Left Kidney: Length: 14.2 cm.  Echogenicity within normal limits. No mass or hydronephrosis visualized.  Abdominal aorta: No aneurysm visualized.  Other findings: No demonstrable ascites.  IMPRESSION: Cholelithiasis.  Splenomegaly. No focal splenic lesions.  Liver echogenicity is increased, most likely due to hepatic steatosis, possibly with superimposed parenchymal liver disease. Note that at several sites, the liver appears mildly lobular in contour. This finding is concerning for potential underlying cirrhosis. While no focal liver lesions are identified, it must be cautioned that the sensitivity of ultrasound for focal liver lesions is diminished in this circumstance.  Portions of pancreas obscured by gas. Visualized portions of pancreas appear normal.  Study otherwise unremarkable.   Electronically Signed  By: Lowella Grip III M.D.  On: 01/28/2015 08:59   ASSESSMENT & PLAN:   59 year old gentleman with   #1  moderate thrombocytopenia that has been progressively slowly worsening over the last 4 years in the setting of likely liver cirrhosis from [place] causing splenomegaly . Given the extent and tempo of the thrombocytopenia . 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 It could also partly be related to his liver disease.  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 #3 hypertension  #4 diabetes  #5 dyslipidemia  #6 obesity  .Body mass index is 36.26 kg/(m^2). Plan  -Patient needs complete and thorough workup of his liver disease. He prefers to have repeat abdominal imaging to reassess his liver and spleen and additional liver workup including hepatitis profile with his primary care physician and his GI doctors which is quite reasonable. -We discussed lifestyle modifications and aggressive attention to diet and weight loss to help control progression of his liver disease with possible. -No indications  for platelet transfusions at this time . -We anticipate that his platelets might remain in the 70,000 to 100,000 range as a result of hypersplenism. -If the platelets are below 50,000 or if there is concern for bleeding due to coagulopathy or esophageal varices or other causes -would need to consider discontinuation of aspirin . Given the esophageal varices the patient does remain at high risk of GI bleeding. -We'll discharge the patient back to his primary care physician.  -Please reconsult [place] if his platelet counts drop below 50,000 for additional input.  All of the patients questions were answered to his apparent satisfaction. The patient knows to call the clinic with any problems, questions or concerns.  I spent 40 minutes counseling the patient face to face. The total time spent in the appointment was 60 minutes and more than 50% was on counseling and direct patient  cares, reviewing his outside records looking at his peripheral blood smear and officially reading it.    Sullivan Lone MD [place] AAHIVMS [facility] Hematology/Oncology Physician [facility]  (Office):       [phone] (Work cell):  [phone] (Fax):           [phone]  08/01/2015 11:17 AM

## 2015-08-01 NOTE — Telephone Encounter (Signed)
GAVE AND PRINTED AVS

## 2015-08-02 LAB — FIBRINOGEN: Fibrinogen: 305 mg/dL (ref 204–475)

## 2015-08-02 LAB — APTT: aPTT: 31 seconds (ref 24–37)

## 2015-09-11 ENCOUNTER — Other Ambulatory Visit: Payer: Self-pay | Admitting: Gastroenterology

## 2015-09-11 DIAGNOSIS — K7460 Unspecified cirrhosis of liver: Secondary | ICD-10-CM

## 2015-09-24 ENCOUNTER — Ambulatory Visit
Admission: RE | Admit: 2015-09-24 | Discharge: 2015-09-24 | Disposition: A | Discharge status: Home / Self Care | Payer: BLUE CROSS/BLUE SHIELD | Source: Ambulatory Visit | Attending: Gastroenterology | Admitting: Gastroenterology

## 2015-09-24 DIAGNOSIS — K7460 Unspecified cirrhosis of liver: Secondary | ICD-10-CM

## 2016-03-11 ENCOUNTER — Other Ambulatory Visit: Payer: Self-pay | Admitting: Gastroenterology

## 2016-03-11 DIAGNOSIS — K7469 Other cirrhosis of liver: Secondary | ICD-10-CM

## 2016-04-24 ENCOUNTER — Ambulatory Visit
Admission: RE | Admit: 2016-04-24 | Discharge: 2016-04-24 | Disposition: A | Discharge status: Home / Self Care | Payer: BLUE CROSS/BLUE SHIELD | Source: Ambulatory Visit | Attending: Gastroenterology | Admitting: Gastroenterology

## 2016-04-24 DIAGNOSIS — K7469 Other cirrhosis of liver: Secondary | ICD-10-CM

## 2016-10-01 ENCOUNTER — Other Ambulatory Visit: Payer: Self-pay | Admitting: Gastroenterology

## 2016-10-01 DIAGNOSIS — K7460 Unspecified cirrhosis of liver: Secondary | ICD-10-CM

## 2016-10-06 ENCOUNTER — Emergency Department: Payer: BC Managed Care – PPO

## 2016-10-06 ENCOUNTER — Emergency Department
Admission: EM | Admit: 2016-10-06 | Discharge: 2016-10-06 | Disposition: A | Discharge status: Home / Self Care | Payer: BC Managed Care – PPO | Attending: Physician Assistant | Admitting: Physician Assistant

## 2016-10-06 DIAGNOSIS — E785 Hyperlipidemia, unspecified: Secondary | ICD-10-CM | POA: Insufficient documentation

## 2016-10-06 DIAGNOSIS — R42 Dizziness and giddiness: Secondary | ICD-10-CM | POA: Insufficient documentation

## 2016-10-06 DIAGNOSIS — K219 Gastro-esophageal reflux disease without esophagitis: Secondary | ICD-10-CM | POA: Insufficient documentation

## 2016-10-06 DIAGNOSIS — R7989 Other specified abnormal findings of blood chemistry: Secondary | ICD-10-CM | POA: Insufficient documentation

## 2016-10-06 HISTORY — DX: Dizziness and giddiness: R42

## 2016-10-06 HISTORY — DX: Fatty (change of) liver, not elsewhere classified: K76.0

## 2016-10-06 HISTORY — DX: Gastro-esophageal reflux disease without esophagitis: K21.9

## 2016-10-06 HISTORY — DX: Hyperlipidemia, unspecified: E78.5

## 2016-10-06 LAB — CBC AND DIFFERENTIAL
Basophils %: 3.5 % — ABNORMAL HIGH (ref 0.0–3.0)
Basophils Absolute: 0.2 10*3/uL (ref 0.0–0.3)
Eosinophils %: 3.5 % (ref 0.0–7.0)
Eosinophils Absolute: 0.2 10*3/uL (ref 0.0–0.8)
Hematocrit: 44 % (ref 39.0–52.5)
Hemoglobin: 15.5 gm/dL (ref 13.0–17.5)
Lymphocytes Absolute: 1.3 10*3/uL (ref 0.6–5.1)
Lymphocytes: 26 % (ref 15.0–46.0)
MCH: 35 pg (ref 28–35)
MCHC: 35 gm/dL (ref 33–37)
MCV: 98 fL (ref 80–100)
MPV: 9.7 fL (ref 8.0–12.0)
Monocytes Absolute: 0.5 10*3/uL (ref 0.1–1.7)
Monocytes: 10 % (ref 3.0–15.0)
Neutrophils %: 57 % (ref 42.0–78.0)
Neutrophils Absolute: 2.8 10*3/uL (ref 1.7–8.6)
PLT CT: 74 10*3/uL — ABNORMAL LOW (ref 130–440)
RBC: 4.49 10*6/uL (ref 4.00–5.70)
RDW: 12.6 % (ref 12.0–15.0)
WBC: 4.9 10*3/uL (ref 4.00–11.00)

## 2016-10-06 LAB — COMPREHENSIVE METABOLIC PANEL
ALT: 36 U/L (ref 0–55)
AST (SGOT): 52 U/L — ABNORMAL HIGH (ref 10–42)
Albumin/Globulin Ratio: 1.03 Ratio (ref 0.70–1.50)
Albumin: 3.7 gm/dL (ref 3.5–5.0)
Alkaline Phosphatase: 54 U/L (ref 40–145)
Anion Gap: 12.2 mMol/L (ref 7.0–18.0)
BUN / Creatinine Ratio: 18.7 Ratio (ref 10.0–30.0)
BUN: 14 mg/dL (ref 7–22)
Bilirubin, Total: 1.5 mg/dL — ABNORMAL HIGH (ref 0.1–1.2)
CO2: 24.1 mMol/L (ref 20.0–30.0)
Calcium: 9.1 mg/dL (ref 8.5–10.5)
Chloride: 106 mMol/L (ref 98–110)
Creatinine: 0.75 mg/dL — ABNORMAL LOW (ref 0.80–1.30)
EGFR: 99 mL/min/{1.73_m2} (ref 60–150)
Globulin: 3.6 gm/dL (ref 2.0–4.0)
Glucose: 107 mg/dL — ABNORMAL HIGH (ref 70–99)
Osmolality Calc: 278 mOsm/kg (ref 275–300)
Potassium: 3.8 mMol/L (ref 3.5–5.3)
Protein, Total: 7.2 gm/dL (ref 6.0–8.3)
Sodium: 139 mMol/L (ref 136–147)

## 2016-10-06 MED ORDER — MECLIZINE HCL 25 MG PO TABS
ORAL_TABLET | ORAL | Status: AC
Start: 2016-10-06 — End: ?
  Filled 2016-10-06: qty 1

## 2016-10-06 MED ORDER — MECLIZINE HCL 25 MG PO TABS
25.0000 mg | ORAL_TABLET | Freq: Four times a day (QID) | ORAL | 0 refills | Status: AC | PRN
Start: 2016-10-06 — End: ?

## 2016-10-06 MED ORDER — ONDANSETRON HCL 4 MG/2ML IJ SOLN
INTRAMUSCULAR | Status: AC
Start: 2016-10-06 — End: ?
  Filled 2016-10-06: qty 2

## 2016-10-06 MED ORDER — MECLIZINE HCL 25 MG PO TABS
50.0000 mg | ORAL_TABLET | Freq: Once | ORAL | Status: AC
Start: 2016-10-06 — End: 2016-10-06
  Administered 2016-10-06: 50 mg via ORAL

## 2016-10-06 MED ORDER — SODIUM CHLORIDE 0.9 % IV SOLN
INTRAVENOUS | Status: DC
Start: 2016-10-06 — End: 2016-10-06
  Administered 2016-10-06: 500 mL via INTRAVENOUS

## 2016-10-06 MED ORDER — ONDANSETRON 4 MG PO TBDP
4.0000 mg | ORAL_TABLET | Freq: Three times a day (TID) | ORAL | 0 refills | Status: AC | PRN
Start: 2016-10-06 — End: ?

## 2016-10-06 MED ORDER — ONDANSETRON HCL 4 MG/2ML IJ SOLN
4.0000 mg | Freq: Once | INTRAMUSCULAR | Status: AC
Start: 2016-10-06 — End: 2016-10-06
  Administered 2016-10-06: 4 mg via INTRAVENOUS

## 2016-10-06 NOTE — ED Notes (Signed)
MD at bedside. 

## 2016-10-06 NOTE — Discharge Instructions (Signed)
Managing Dizziness (Vertigo) with Medicines    Although medicines can't cure your problem, they can help control symptoms. Your doctor may prescribe medicines for a few weeks and then taper them off. Always take your medicine as prescribed. Never share your medicine with others.  Contact your healthcare provider right away if you have side effects from your medicines.   How medicines can help   Treat infection or inflammation. If you have an infection caused by bacteria, your doctor can prescribe antibiotics.   Limit conflicting balance signals. These medicines are often in pill form.   Ease nausea. Suppositories, pills, or shots can reduce vomiting.   Reduce pressure in the canals. Diuretics can be used to treat Meniere's disease. These medicines help your body get rid of extra fluid.   Ease other symptoms. Other medicines can help ease depression and anxiety caused by living with dizziness or fainting.  Date Last Reviewed: 08/27/2015   2000-2016 [employer] [address], [place] [address]. All rights reserved. This information is not intended as a substitute for professional medical care. Always follow your healthcare professional's instructions.

## 2016-10-06 NOTE — ED Provider Notes (Signed)
[facility]  EMERGENCY DEPARTMENT  History and Physical Exam       Patient Name: Manuel Parker, Manuel Parker  Encounter Date:  10/06/2016  Physician Assistant: Judson Roch, PA-C  Attending Physician: Alger Simons, MD  PCP: Christa See, MD  Patient DOB:  Mar 08, 1956  MRN:  [id number]  Room:  E5/ED5-A      History of Presenting Illness     Chief complaint: Dizziness    HPI/ROS given by: Patient    Manuel Parker is a 60 y.o. male who presents with Dizziness that started last night. Patient states that he felt a pop in his ear and immediately felt dizzy for about 5-10 minutes. This then improved but he has had some residual dizziness throughout the day today. [facility] and was sent to the emergency department for further evaluation. The patient states he had similar symptoms about 3 years ago and was diagnosed with vertigo. He denies any focal weaknesses. He reports mild nausea but he has been able to eat today. He denies any pain. Denies any chest pain, shortness of breath. Denies any recent illnesses. He does wear hearing aids.       Review of Systems     Review of Systems   Constitutional: Negative for chills and fever.   HENT: Negative for congestion.    Eyes: Negative for visual disturbance.   Respiratory: Negative for cough and shortness of breath.    Cardiovascular: Negative for chest pain.   Gastrointestinal: Negative for abdominal pain, nausea and vomiting.   Genitourinary: Negative for dysuria.   Musculoskeletal: Negative for arthralgias.   Skin: Negative for rash.   Neurological: Positive for dizziness. Negative for syncope and headaches.   Hematological: Does not bruise/bleed easily.        Allergies & Medications     Pt has No Known Allergies.    Current/Home Medications    COENZYME Q-10 100 MG CAPSULE    Take 100 mg by mouth daily.    OMEGA-3 FATTY ACIDS (OMEGA-3 FISH OIL) 500 MG CAP    Take 1 capsule by mouth daily.    PANTOPRAZOLE (PROTONIX) 40 MG  TABLET    Take 40 mg by mouth daily.    ROSUVASTATIN (CRESTOR) 10 MG TABLET    Take 10 mg by mouth daily.        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     Past Surgical History     Pt  has a past surgical history that includes Hernia repair and Carpal tunnel release.     Family History     The family history is not on file.     Social History     Pt reports that he has never smoked. He has never used smokeless tobacco. He reports that he does not drink alcohol or use drugs.     Physical Exam     Vitals:    10/06/16 1047 10/06/16 1112 10/06/16 1152 10/06/16 1218   BP: 141/80 (S) 125/73 127/77 135/82   Pulse: 70 (S) 63 66 68   Resp: 18  16 18    Temp: 97.9 F (36.6 C)      TempSrc: Oral      SpO2: 95%  95% 97%   Weight: 110.7 kg      Height: 1.753 m  Physical Exam   Constitutional: He is oriented to person, place, and time. He appears well-developed and well-nourished. No distress.   HENT:   Head: Normocephalic and atraumatic.   Right Ear: Tympanic membrane and external ear normal.   Left Ear: External ear normal. A middle ear effusion is present.   Eyes: Conjunctivae are normal. Pupils are equal, round, and reactive to light. Right eye exhibits no discharge. Left eye exhibits no discharge. Right eye exhibits nystagmus. Left eye exhibits nystagmus.   Neck: Normal range of motion.   Cardiovascular: Normal rate, regular rhythm and intact distal pulses.  Exam reveals friction rub. Exam reveals no gallop.    No murmur heard.  Pulmonary/Chest: Effort normal and breath sounds normal. No respiratory distress. He has no wheezes. He has no rales.   Musculoskeletal: Normal range of motion.   Neurological: He is alert and oriented to person, place, and time. He has normal reflexes. No cranial nerve deficit.   Skin: Skin is warm and dry.   Nursing note and vitals  reviewed.         Diagnostic Results     The results of the diagnostic studies below have been reviewed by myself:    Labs  Results     Procedure Component Value Units Date/Time    Comprehensive metabolic panel [[id number]]  (Abnormal) Collected:  10/06/16 1100    Specimen:  Plasma Updated:  10/06/16 1131     Sodium 139 mMol/L      Potassium 3.8 mMol/L      Chloride 106 mMol/L      CO2 24.1 mMol/L      Calcium 9.1 mg/dL      Glucose 657 (H) mg/dL      Creatinine 8.46 (L) mg/dL      BUN 14 mg/dL      Protein, Total 7.2 gm/dL      Albumin 3.7 gm/dL      Alkaline Phosphatase 54 U/L      ALT 36 U/L      AST (SGOT) 52 (H) U/L      Bilirubin, Total 1.5 (H) mg/dL      Albumin/Globulin Ratio 1.03 Ratio      Anion Gap 12.2 mMol/L      BUN/Creatinine Ratio 18.7 Ratio      EGFR 99 mL/min/1.77m2      Osmolality Calc 278 mOsm/kg      Globulin 3.6 gm/dL     CBC and differential [[id number]]  (Abnormal) Collected:  10/06/16 1100    Specimen:  Blood from Blood Updated:  10/06/16 1121     WBC 4.9 K/cmm      RBC 4.49 M/cmm      Hemoglobin 15.5 gm/dL      Hematocrit 32.4 %      MCV 98 fL      MCH 35 pg      MCHC 35 gm/dL      RDW 40.1 %      PLT CT 74 (L) K/cmm      MPV 9.7 fL      NEUTROPHIL % 57.0 %      Lymphocytes 26.0 %      Monocytes 10.0 %      Eosinophils % 3.5 %      Basophils % 3.5 (H) %      Neutrophils Absolute 2.8 K/cmm      Lymphocytes Absolute 1.3 K/cmm      Monocytes Absolute 0.5 K/cmm      Eosinophils Absolute  0.2 K/cmm      BASO Absolute 0.2 K/cmm           Radiologic Studies  No results found.    ION:GEXBMWUXL at 1052 shows Sinus Rhythm with rate of 71. No ST segment elevation. No ectopy. No previous EKG available for comparison.          ED Course and Medical Decision Making     ED Medication Orders     Start Ordered     Status Ordering Provider    10/06/16 1121 10/06/16 1120  meclizine (ANTIVERT) tablet 50 mg  Once in ED     Route: Oral  Ordered Dose: 50 mg     Last MAR action:  Given Sanayah Munro A    10/06/16  1121 10/06/16 1120  ondansetron (ZOFRAN) injection 4 mg  Once in ED     Route: Intravenous  Ordered Dose: 4 mg     Last MAR action:  Given Vester Balthazor A    10/06/16 1102 10/06/16 1102  0.9%  NaCl infusion  Continuous     Route: Intravenous     Last MAR action:  New Bag BERRY, WAYNE J II          This patient presents to the Emergency Department with dizziness/vertigo.  Based on my history, examination, and evaluation, several differential diagnoses including peripheral vertigo, central vertigo, dehydration, neurologic/cardiac causes for dizziness have been considered.  Serious or potentially life-threatening causes of the patient's symptoms like cardiac/neurologic causes seem unlikely. The patient seems improved, is able to ambulation and keep fluids down, is neurologically intact, and can be discharged home and managed with symptomatic care.  I advised the patient to return to the emergency department immediately should they develop syncope, neurologic symptoms, chest pain, uncontrolled vertigo, or any acute concerns .  Diagnostic impression and plan were discussed with the patient and/or family.  If ordered, results of lab/radiology tests were reviewed and discussed with the patient and/or family. Questions were answered and concerns were addressed.  The patient was encouraged to follow-up with their primary care provider or specialist.    In addition to the above history, please see nursing notes. Allergies, meds, past medical, family, social hx, and the results of the diagnostic studies performed have been reviewed by myself.    I discussed this case with the attending physician in the emergency department and they agree with the assessment and treatment plan.     This chart was generated by an EMR and may contain errors or omissions not intended by the user.     Procedures / Critical Care     None     Diagnosis / Disposition     Clinical Impression  1. Vertigo        Disposition  ED Disposition     ED  Disposition Condition Date/Time Comment    Discharge  Tue Oct 06, 2016 12:20 PM [name] discharge to home/self care.    Condition at disposition: Stable    Ambulated to exit with spouse              Follow up for Discharged Patients  Your PCP    In 1 week      [facility] Emergency Department  [address]  [place] [address]  [phone]    As needed, If symptoms worsen      Prescriptions for Discharged Patients  New Prescriptions    MECLIZINE (ANTIVERT) 25 MG TABLET    Take 1  tablet (25 mg total) by mouth every 6 (six) hours as needed for Dizziness.for up to 20 doses    ONDANSETRON (ZOFRAN ODT) 4 MG DISINTEGRATING TABLET    Take 1 tablet (4 mg total) by mouth every 8 (eight) hours as needed for Nausea.             Eino Farber, [place]  10/06/16 [phone]

## 2016-10-06 NOTE — ED Notes (Signed)
Pt stable at this time with no noted distress.  Call bell in reach and pt placed in gown, declined blanket at this time.  Family at bedside.

## 2016-10-12 LAB — ECG 12-LEAD
P Wave Axis: 54 deg
P-R Interval: 169 ms
Patient Age: 60 years
Q-T Interval(Corrected): 447 ms
Q-T Interval: 411 ms
QRS Axis: 27 deg
QRS Duration: 97 ms
T Axis: 24 years
Ventricular Rate: 71 //min

## 2016-10-15 ENCOUNTER — Ambulatory Visit
Admission: RE | Admit: 2016-10-15 | Discharge: 2016-10-15 | Disposition: A | Discharge status: Home / Self Care | Payer: BLUE CROSS/BLUE SHIELD | Source: Ambulatory Visit | Attending: Gastroenterology | Admitting: Gastroenterology

## 2016-10-15 DIAGNOSIS — K7460 Unspecified cirrhosis of liver: Secondary | ICD-10-CM

## 2016-11-03 ENCOUNTER — Emergency Department (HOSPITAL_BASED_OUTPATIENT_CLINIC_OR_DEPARTMENT_OTHER)
Admission: EM | Admit: 2016-11-03 | Discharge: 2016-11-03 | Disposition: A | Discharge status: Home / Self Care | Payer: BLUE CROSS/BLUE SHIELD | Attending: Emergency Medicine | Admitting: Emergency Medicine

## 2016-11-03 ENCOUNTER — Encounter (HOSPITAL_BASED_OUTPATIENT_CLINIC_OR_DEPARTMENT_OTHER): Payer: Self-pay | Admitting: Emergency Medicine

## 2016-11-03 ENCOUNTER — Emergency Department (HOSPITAL_BASED_OUTPATIENT_CLINIC_OR_DEPARTMENT_OTHER): Payer: BLUE CROSS/BLUE SHIELD

## 2016-11-03 DIAGNOSIS — E119 Type 2 diabetes mellitus without complications: Secondary | ICD-10-CM | POA: Insufficient documentation

## 2016-11-03 DIAGNOSIS — J069 Acute upper respiratory infection, unspecified: Secondary | ICD-10-CM | POA: Diagnosis not present

## 2016-11-03 DIAGNOSIS — Z79899 Other long term (current) drug therapy: Secondary | ICD-10-CM | POA: Diagnosis not present

## 2016-11-03 DIAGNOSIS — B9789 Other viral agents as the cause of diseases classified elsewhere: Secondary | ICD-10-CM

## 2016-11-03 DIAGNOSIS — R05 Cough: Secondary | ICD-10-CM | POA: Diagnosis present

## 2016-11-03 HISTORY — DX: Unspecified cirrhosis of liver: K74.60

## 2016-11-03 MED ORDER — SODIUM CHLORIDE 0.9 % IV BOLUS (SEPSIS)
1000.0000 mL | Freq: Once | INTRAVENOUS | Status: AC
  Administered 2016-11-03: 1000 mL via INTRAVENOUS

## 2016-11-03 MED ORDER — GUAIFENESIN ER 600 MG PO TB12
1200.0000 mg | ORAL_TABLET | Freq: Once | ORAL | Status: DC
  Filled 2016-11-03: qty 2

## 2016-11-03 MED ORDER — IPRATROPIUM-ALBUTEROL 0.5-2.5 (3) MG/3ML IN SOLN
3.0000 mL | RESPIRATORY_TRACT | Status: DC
  Administered 2016-11-03: 3 mL via RESPIRATORY_TRACT
  Filled 2016-11-03: qty 3

## 2016-11-03 MED ORDER — KETOROLAC TROMETHAMINE 30 MG/ML IJ SOLN
30.0000 mg | Freq: Once | INTRAMUSCULAR | Status: AC
  Administered 2016-11-03: 30 mg via INTRAVENOUS
  Filled 2016-11-03: qty 1

## 2016-11-03 MED ORDER — ACETAMINOPHEN 500 MG PO TABS
1000.0000 mg | ORAL_TABLET | Freq: Once | ORAL | Status: DC
Start: 2016-11-03 — End: 2016-11-03
  Administered 2016-11-03: 1000 mg via ORAL
  Filled 2016-11-03: qty 2

## 2016-11-03 MED ORDER — GUAIFENESIN 100 MG/5ML PO SOLN
15.0000 mL | Freq: Once | ORAL | Status: AC
  Administered 2016-11-03: 300 mg via ORAL
  Filled 2016-11-03: qty 15

## 2016-11-03 NOTE — ED Triage Notes (Signed)
Pt started with a cold/cough this morning and got some OTC cough med.  Took it tonight and went to bed.  Had a coughing episode that was severe and pt sts he could not get air through his mouth or nose.  Sts wife had him go outside into the cold air and then it eased up.  At this time pt is in NAD.  Resp even and unlabored.

## 2016-11-03 NOTE — ED Notes (Signed)
Cough onset this am, tonight had coughing spell causing sob and felt like he was wheezing,  No distress at present

## 2016-11-03 NOTE — ED Provider Notes (Signed)
[facility] Provider Note   CSN: [id number] Arrival date & time: 11/03/16  0002     History   Chief Complaint Chief Complaint  Patient presents with  . Cough    HPI Mike Dunn is a 61 y.o. male past medical history diabetes, obesity, presenting today with shortness of breath. He states for the last 2 days he has had nonproductive cough. He denies fevers. He's had lots of rhinorrhea and congestion. Tonight when he laid down and sleeping felt as if he is choking and could not breathe. He said this really scared him. He did not take any medications prior to arrival. He denies any chest pain. Currently he states he feels much better. He does have sick contacts as there is a viral illness going all around his work. There are no further complaints.  10 Systems reviewed and are negative for acute change except as noted in the HPI.    HPI  Past Medical History:  Diagnosis Date  . Cancer ([place])   . Cirrhosis, nonalcoholic ([place])   . Diabetes mellitus without complication ([place])   . Hyperlipidemia   . Obesity     There are no active problems to display for this patient.   Past Surgical History:  Procedure Laterality Date  . BASAL CELL CARCINOMA EXCISION    . CARPAL TUNNEL RELEASE    . HERNIA REPAIR    . TONSILLECTOMY         Home Medications    Prior to Admission medications   Medication Sig Start Date End Date Taking? Authorizing Provider  aspirin 81 MG tablet Take 81 mg by mouth daily.      Historical Provider, MD  co-enzyme Q-10 50 MG capsule Take 50 mg by mouth daily.      Historical Provider, MD  fish oil-omega-3 fatty acids 1000 MG capsule Take 1 g by mouth daily.      Historical Provider, MD  Multiple Vitamins-Minerals (MULTI FOR HIM 50+ PO) Take 1 tablet by mouth daily.      Historical Provider, MD  pantoprazole (PROTONIX) 40 MG tablet Take 40 mg by mouth daily.    Historical Provider, MD  Probiotic Product (ALIGN) 4 MG CAPS Take 4 mg by mouth daily.     Historical Provider, MD  rosuvastatin (CRESTOR) 10 MG tablet Take 10 mg by mouth daily.      Historical Provider, MD    Family History No family history on file.  Social History Social History  Substance Use Topics  . Smoking status: Never Smoker  . Smokeless tobacco: Never Used  . Alcohol use No     Allergies   Zocor [simvastatin]   Review of Systems Review of Systems   Physical Exam Updated Vital Signs BP 136/78 (BP Location: Left Arm)   Pulse 101   Temp 98.4 F (36.9 C) (Oral)   Resp 16   Ht 5\' 9"  (1.753 m)   Wt 243 lb (110.2 kg)   SpO2 96%   BMI 35.88 kg/m   Physical Exam  Constitutional: He is oriented to person, place, and time. Vital signs are normal. He appears well-developed and well-nourished.  Non-toxic appearance. He does not appear ill. No distress.  HENT:  Head: Normocephalic and atraumatic.  Nose: Nose normal.  Mouth/Throat: Oropharynx is clear and moist. No oropharyngeal exudate.  Eyes: Conjunctivae and EOM are normal. Pupils are equal, round, and reactive to light. No scleral icterus.  Neck: Normal range of motion. Neck supple. No tracheal  deviation, no edema, no erythema and normal range of motion present. No thyroid mass and no thyromegaly present.  Cardiovascular: Normal rate, regular rhythm, S1 normal, S2 normal, normal heart sounds, intact distal pulses and normal pulses.  Exam reveals no gallop and no friction rub.   No murmur heard. Pulmonary/Chest: Effort normal and breath sounds normal. No respiratory distress. He has no wheezes. He has no rhonchi. He has no rales.  Abdominal: Soft. Normal appearance and bowel sounds are normal. He exhibits no distension, no ascites and no mass. There is no hepatosplenomegaly. There is no tenderness. There is no rebound, no guarding and no CVA tenderness.  Musculoskeletal: Normal range of motion. He exhibits no edema or tenderness.  Lymphadenopathy:    He has no cervical adenopathy.  Neurological: He is  alert and oriented to person, place, and time. He has normal strength. No cranial nerve deficit or sensory deficit.  Skin: Skin is warm, dry and intact. No petechiae and no rash noted. He is not diaphoretic. No erythema. No pallor.  Nursing note and vitals reviewed.    ED Treatments / Results  Labs (all labs ordered are listed, but only abnormal results are displayed) Labs Reviewed - No data to display  EKG  EKG Interpretation None       Radiology Dg Chest 2 View  Result Date: 11/03/2016 CLINICAL DATA:  Acute onset of shortness of breath and cough. Initial encounter. EXAM: CHEST  2 VIEW COMPARISON:  Thoracic spine radiographs performed 10/11/2014 FINDINGS: The lungs are well-aerated. Mild vascular congestion is noted. Mild bibasilar atelectasis is seen. There is no evidence of pleural effusion or pneumothorax. The heart is normal in size; the mediastinal contour is within normal limits. No acute osseous abnormalities are seen. IMPRESSION: Mild vascular congestion noted.  Mild bibasilar atelectasis seen. Electronically Signed   By: Garald Balding M.D.   On: 11/03/2016 01:17    Procedures Procedures (including critical care time)  Medications Ordered in ED Medications  ipratropium-albuterol (DUONEB) 0.5-2.5 (3) MG/3ML nebulizer solution 3 mL (3 mLs Nebulization Given 11/03/16 0202)  ketorolac (TORADOL) 30 MG/ML injection 30 mg (30 mg Intravenous Given 11/03/16 0149)  sodium chloride 0.9 % bolus 1,000 mL (1,000 mLs Intravenous New Bag/Given 11/03/16 0134)  guaiFENesin (ROBITUSSIN) 100 MG/5ML solution 300 mg (300 mg Oral Given 11/03/16 0147)     Initial Impression / Assessment and Plan / ED Course  I have reviewed the triage vital signs and the nursing notes.  Pertinent labs & imaging results that were available during my care of the patient were reviewed by me and considered in my medical decision making (see chart for details).  Clinical Course     Patient presents to the ED for  viral respiratory illness.  Will obtain CXR to evaluate for pneumonia.  He was given toradol, duoneb, and robitussin for his symptoms.  Likely had a mucous plug of some sort.  Will conitnue to monitor.   2:28 AM chest x-rays negative for acute infection. Upon repeat evaluation, patient states he feels much better and is breathing easier. He was educated on file respiratory infections. He was advised to take ibuprofen at home as needed. Also over-the-counter decongestants, warm showers can help break up any mucus. He denies his skin is tenting of the plan. Primary care follow-up advised within 3 days. Vital signs were within his normal limits and he is safe for discharge.   Final Clinical Impressions(s) / ED Diagnoses   Final diagnoses:  None  New Prescriptions New Prescriptions   No medications on file     Everlene Balls, MD 11/03/16 [phone]

## 2017-04-16 ENCOUNTER — Other Ambulatory Visit: Payer: Self-pay | Admitting: Gastroenterology

## 2017-04-16 DIAGNOSIS — K7469 Other cirrhosis of liver: Secondary | ICD-10-CM

## 2017-04-27 ENCOUNTER — Ambulatory Visit
Admission: RE | Admit: 2017-04-27 | Discharge: 2017-04-27 | Disposition: A | Discharge status: Home / Self Care | Payer: BLUE CROSS/BLUE SHIELD | Source: Ambulatory Visit | Attending: Gastroenterology | Admitting: Gastroenterology

## 2017-04-27 DIAGNOSIS — K7469 Other cirrhosis of liver: Secondary | ICD-10-CM

## 2017-09-01 ENCOUNTER — Encounter: Payer: Self-pay | Admitting: *Deleted

## 2017-09-02 ENCOUNTER — Ambulatory Visit (INDEPENDENT_AMBULATORY_CARE_PROVIDER_SITE_OTHER): Payer: BLUE CROSS/BLUE SHIELD | Admitting: Neurology

## 2017-09-02 ENCOUNTER — Encounter: Payer: Self-pay | Admitting: Neurology

## 2017-09-02 VITALS — BP 134/77 | HR 66 | Ht 69.0 in | Wt 247.0 lb

## 2017-09-02 DIAGNOSIS — R600 Localized edema: Secondary | ICD-10-CM | POA: Diagnosis not present

## 2017-09-02 DIAGNOSIS — R0689 Other abnormalities of breathing: Secondary | ICD-10-CM | POA: Diagnosis not present

## 2017-09-02 DIAGNOSIS — Z820 Family history of epilepsy and other diseases of the nervous system: Secondary | ICD-10-CM | POA: Diagnosis not present

## 2017-09-02 DIAGNOSIS — E669 Obesity, unspecified: Secondary | ICD-10-CM | POA: Diagnosis not present

## 2017-09-02 DIAGNOSIS — G4719 Other hypersomnia: Secondary | ICD-10-CM | POA: Diagnosis not present

## 2017-09-02 DIAGNOSIS — R002 Palpitations: Secondary | ICD-10-CM

## 2017-09-02 DIAGNOSIS — R351 Nocturia: Secondary | ICD-10-CM

## 2017-09-02 DIAGNOSIS — R0683 Snoring: Secondary | ICD-10-CM

## 2017-09-02 NOTE — Patient Instructions (Signed)

## 2017-09-02 NOTE — Progress Notes (Signed)
Subjective:    Patient ID: Mike Dunn is a 61 y.o. male.  HPI     Star Age, MD, PhD [facility] [address], [address] [place], [place] [address]  Dear Dr. Louis Meckel,   I saw your patient, Mike Dunn, upon your kind request in my neurologic clinic today for initial consultation of his sleep disorder, in particular, concern for underlying obstructive sleep apnea. The patient is accompanied by his wife today. As you know, Mike Dunn is a 50 year old right-handed gentleman with an underlying medical history of prostatitis, history of UTI, BPH, hyperlipidemia, non alcoholic liver cirrhosis and esophageal varices, and obesity, who reports snoring and excessive daytime somnolence as well as nocturia. I reviewed your office note from 07/29/2017, which you kindly included. He does not sleep through the night. His Epworth sleepiness score is 9 out of 24, fatigue score is 52 out of 63. He lives with his wife. They have 2 children. He is a nonsmoker and does not utilize alcohol, he drinks caffeine in the form of coffee, usually one cup per day.  He reports stress at work. He had a change in his position earlier this year. He tries to be in bed around 8:30 or 9, he often wakes up multiple times throughout the night, often to go to the bathroom. He has nocturia up to 4 times each night. Lately with a new bladder medication it improved to about 2 times per night. He does not have morning headaches. He had a tonsillectomy and adenoidectomy at age 82. His younger brother has sleep apnea and has a CPAP machine. His father most likely had sleep apnea as well. Patient takes melatonin 3 mg at night. His rise time is 5 AM. He often takes a nap in the afternoons. He wakes up gasping and with palpitations at times. He does not have telltale symptoms of restless leg syndrome. His wife sleeps in a separate bedroom typically.  His Past Medical History Is Significant For: Past  Medical History:  Diagnosis Date  . BPH (benign prostatic hyperplasia)   . Cancer ([place])   . Cirrhosis, nonalcoholic ([place])   . Diabetes mellitus without complication ([place])   . Hyperlipidemia   . Obesity   . Prostatitis, chronic   . Spermatocele of epididymis 2014    His Past Surgical History Is Significant For: Past Surgical History:  Procedure Laterality Date  . BASAL CELL CARCINOMA EXCISION    . CARPAL TUNNEL RELEASE    . HERNIA REPAIR     ventral and inguinal   . MOUTH SURGERY    . TONSILLECTOMY      His Family History Is Significant For: Family History  Problem Relation Age of Onset  . Hypertension Mother   . Heart disease Father   . Nephrolithiasis Brother     His Social History Is Significant For: Social History   Socioeconomic History  . Marital status: Married    Spouse name: None  . Number of children: None  . Years of education: None  . Highest education level: None  Social Needs  . Financial resource strain: None  . Food insecurity - worry: None  . Food insecurity - inability: None  . Transportation needs - medical: None  . Transportation needs - non-medical: None  Occupational History  . None  Tobacco Use  . Smoking status: Never Smoker  . Smokeless tobacco: Never Used  Substance and Sexual Activity  . Alcohol use: No  . Drug use:  No  . Sexual activity: No  Other Topics Concern  . None  Social History Narrative  . None    His Allergies Are:  Allergies  Allergen Reactions  . Tamsulosin   . Zocor [Simvastatin] Other (See Comments)    Caused numbness  :   His Current Medications Are:  Outpatient Encounter Medications as of 09/02/2017  Medication Sig  . alfuzosin (UROXATRAL) 10 MG 24 hr tablet Take 10 mg daily by mouth.  . co-enzyme Q-10 50 MG capsule Take 50 mg by mouth daily.    . CORGARD 40 MG tablet Take 40 mg daily by mouth.  . fenofibrate 160 MG tablet Take 160 mg daily by mouth.  . ferrous sulfate 325 (65 FE) MG tablet Take 325  mg daily with breakfast by mouth.  . fish oil-omega-3 fatty acids 1000 MG capsule Take 1 g by mouth daily.    . Melatonin 10 MG CAPS Take by mouth.  . Multiple Vitamins-Minerals (MULTI FOR HIM 50+ PO) Take 1 tablet by mouth daily.    . pantoprazole (PROTONIX) 40 MG tablet Take 40 mg by mouth daily.  . Probiotic Product (ALIGN) 4 MG CAPS Take 4 mg by mouth daily.  . rosuvastatin (CRESTOR) 10 MG tablet Take 10 mg by mouth daily.    . [DISCONTINUED] aspirin 81 MG tablet Take 81 mg by mouth daily.     No facility-administered encounter medications on file as of 09/02/2017.   :  Review of Systems:  Out of a complete 14 point review of systems, all are reviewed and negative with the exception of these symptoms as listed below:      Review of Systems  Neurological:       Pt presents today to discuss his sleep. Pt does endorse snoring and waking up several times per night. Pt has never had a sleep study.  Epworth Sleepiness Scale 0= would never doze 1= slight chance of dozing 2= moderate chance of dozing 3= high chance of dozing  Sitting and reading: 1 Watching TV: 1 Sitting inactive in a public place (ex. Theater or meeting): 1 As a passenger in a car for an hour without a break: 2 Lying down to rest in the afternoon: 2 Sitting and talking to someone: 1 Sitting quietly after lunch (no alcohol): 1 In a car, while stopped in traffic: 0 Total: 9     Objective:  Neurological Exam  Physical Exam Physical Examination:   Vitals:   09/02/17 1554  BP: 134/77  Pulse: 66   General Examination: The patient is a very pleasant 61 y.o. male in no acute distress. He appears well-developed and well-nourished and well groomed.   HEENT: Normocephalic, atraumatic, pupils are equal, round and reactive to light and accommodation. Funduscopic exam is normal with sharp disc margins noted. He has mild bilateral cataracts. Extraocular tracking is good without limitation to gaze excursion or  nystagmus noted. Normal smooth pursuit is noted. Hearing is grossly intact. He has bilateral hearing aids. Face is symmetric with normal facial animation and normal facial sensation. Speech is clear with no dysarthria noted. There is no hypophonia. There is no lip, neck/head, jaw or voice tremor. Neck is supple with full range of passive and active motion. There are no carotid bruits on auscultation. Oropharynx exam reveals: moderate mouth dryness, adequate dental hygiene  with partial dentures on top.and moderate airway crowding, due to redundant soft palate. Mallampati is class II. Tongue protrudes centrally and palate elevates symmetrically. Tonsils are absent. Neck  size is 19 1/8 inches. He has a Moderate overbite.   Chest: Clear to auscultation without wheezing, rhonchi or crackles noted.  Heart: S1+S2+0, regular and normal without murmurs, rubs or gallops noted.   Abdomen: Soft, non-tender and non-distended with normal bowel sounds appreciated on auscultation.  Extremities: There is trace pitting edema in the distal lower extremities bilaterally. Pedal pulses are intact.  Skin: Warm and dry without trophic changes noted.  Musculoskeletal: exam reveals no obvious joint deformities, tenderness or joint swelling or erythema.   Neurologically:  Mental status: The patient is awake, alert and oriented in all 4 spheres. Her immediate and remote memory, attention, language skills and fund of knowledge are appropriate. There is no evidence of aphasia, agnosia, apraxia or anomia. Speech is clear with normal prosody and enunciation. Thought process is linear. Mood is normal and affect is normal.  Cranial nerves II - XII are as described above under HEENT exam. In addition: shoulder shrug is normal with equal shoulder height noted. Motor exam: Normal bulk, strength and tone is noted. There is no drift, tremor or rebound. Romberg is negative. Reflexes are 2+ throughout. Fine motor skills and coordination:  intact with normal finger taps, normal hand movements, normal rapid alternating patting, normal foot taps and normal foot agility.  Cerebellar testing: No dysmetria or intention tremor on finger to nose testing. Heel to shin is unremarkable bilaterally. There is no truncal or gait ataxia.  Sensory exam: intact to light touch in the upper and lower extremities.  Gait, station and balance: He stands easily. No veering to one side is noted. No leaning to one side is noted. Posture is age-appropriate and stance is narrow based. Gait shows normal stride length and normal pace. No problems turning are noted. Tandem walk is unremarkable.   Assessment and Plan:   In summary, Mike Dunn is a very pleasant 61 y.o.-year old male with an underlying medical history of prostatitis, history of UTI, BPH, hyperlipidemia, non alcoholic liver cirrhosis and esophageal varices, and obesity, whose history and physical exam are concerning for obstructive sleep apnea (OSA). I had a long chat with the patient and his wife about my findings and the diagnosis of OSA, its prognosis and treatment options. We talked about medical treatments, surgical interventions and non-pharmacological approaches. I explained in particular the risks and ramifications of untreated moderate to severe OSA, especially with respect to developing cardiovascular disease down the Road, including congestive heart failure, difficult to treat hypertension, cardiac arrhythmias, or stroke. Even type 2 diabetes has, in part, been linked to untreated OSA. Symptoms of untreated OSA include daytime sleepiness, memory problems, mood irritability and mood disorder such as depression and anxiety, lack of energy, as well as recurrent headaches, especially morning headaches. We talked about trying to maintain a healthy lifestyle in general, as well as the importance of weight control. I encouraged the patient to eat healthy, exercise daily and keep well hydrated, to  keep a scheduled bedtime and wake time routine, to not skip any meals and eat healthy snacks in between meals. I advised the patient not to drive when feeling sleepy. I recommended the following at this time: sleep study with potential positive airway pressure titration. (We will score hypopneas at 3%).   I explained the sleep test procedure to the patient and also outlined possible surgical and non-surgical treatment options of OSA, including the use of a custom-made dental device (which would require a referral to a specialist dentist or oral surgeon), upper airway  surgical options, such as pillar implants, radiofrequency surgery, tongue base surgery, and UPPP (which would involve a referral to an ENT surgeon). Rarely, jaw surgery such as mandibular advancement may be considered.  I also explained the CPAP treatment option to the patient, who indicated that he would be willing to try CPAP if the need arises. I explained the importance of being compliant with PAP treatment, not only for insurance purposes but primarily to improve His symptoms, and for the patient's long term health benefit, including to reduce Her cardiovascular risks. I answered all their questions today and the patient and his wife were in agreement. I would like to see him back after the sleep study is completed and encouraged him to call with any interim questions, concerns, problems or updates.   Thank you very much for allowing me to participate in the care of this nice patient. If I can be of any further assistance to you please do not hesitate to call me at [phone].  Sincerely,   Star Age, MD, PhD

## 2017-10-15 ENCOUNTER — Ambulatory Visit (INDEPENDENT_AMBULATORY_CARE_PROVIDER_SITE_OTHER): Payer: BLUE CROSS/BLUE SHIELD | Admitting: Neurology

## 2017-10-15 DIAGNOSIS — R0689 Other abnormalities of breathing: Secondary | ICD-10-CM

## 2017-10-15 DIAGNOSIS — G4710 Hypersomnia, unspecified: Secondary | ICD-10-CM

## 2017-10-15 DIAGNOSIS — E669 Obesity, unspecified: Secondary | ICD-10-CM

## 2017-10-15 DIAGNOSIS — G4720 Circadian rhythm sleep disorder, unspecified type: Secondary | ICD-10-CM

## 2017-10-15 DIAGNOSIS — Z820 Family history of epilepsy and other diseases of the nervous system: Secondary | ICD-10-CM

## 2017-10-15 DIAGNOSIS — R600 Localized edema: Secondary | ICD-10-CM

## 2017-10-15 DIAGNOSIS — R0683 Snoring: Secondary | ICD-10-CM

## 2017-10-15 DIAGNOSIS — R002 Palpitations: Secondary | ICD-10-CM

## 2017-10-15 DIAGNOSIS — R351 Nocturia: Secondary | ICD-10-CM

## 2017-10-15 DIAGNOSIS — G4733 Obstructive sleep apnea (adult) (pediatric): Secondary | ICD-10-CM

## 2017-10-15 DIAGNOSIS — G4719 Other hypersomnia: Secondary | ICD-10-CM

## 2017-11-02 ENCOUNTER — Other Ambulatory Visit: Payer: Self-pay | Admitting: Neurology

## 2017-11-02 ENCOUNTER — Telehealth: Payer: Self-pay

## 2017-11-02 DIAGNOSIS — G4720 Circadian rhythm sleep disorder, unspecified type: Secondary | ICD-10-CM

## 2017-11-02 DIAGNOSIS — G4733 Obstructive sleep apnea (adult) (pediatric): Secondary | ICD-10-CM

## 2017-11-02 NOTE — Telephone Encounter (Signed)
I called pt's work number, was able to reach him. I advised pt that Dr. Rexene Alberts reviewed their sleep study results and found that pt has mild to moderate osa, which is near severe in supine sleep, and recommends that pt be treated with a cpap. Dr. Rexene Alberts recommends that pt return for a repeat sleep study in order to properly titrate the cpap and ensure a good mask fit. Pt is agreeable to returning for a titration study. I advised pt that our sleep lab will file with pt's insurance and call pt to schedule the sleep study when we hear back from the pt's insurance regarding coverage of this sleep study. Pt verbalized understanding of results.  Pt says that he called his insurance and his insurance told him that a sleep study Mike Dunn was never attempted by our office in December and wants our sleep lab to call him regarding this concern.

## 2017-11-02 NOTE — Procedures (Signed)
PATIENT'S NAME:  Mike Dunn, Mike Dunn DOB:      1956-04-25      MR#:    [id number]     DATE OF RECORDING: 10/15/2017 REFERRING M.D.:  Dr. Charlesetta Garibaldi Performed:   Baseline Polysomnogram HISTORY: 62 year old man with a of prostatitis, history of UTI, BPH, hyperlipidemia, non alcoholic liver cirrhosis, esophageal varices, and obesity, who reports snoring and excessive daytime somnolence as well as nocturia. The patient endorsed the Epworth Sleepiness Scale at 9 points. The patient's weight 247 pounds with a height of 69 (inches), resulting in a BMI of 36.6 kg/m2. The patient's neck circumference measured 13.5 inches.  CURRENT MEDICATIONS: Uroxatral, Co Q 10, Corgard, Fenofibrate, Iron, Fish Oil-Omega 3, Melatonin, Protonix, Align, Crestor   PROCEDURE:  This is a multichannel digital polysomnogram utilizing the Somnostar 11.2 system.  Electrodes and sensors were applied and monitored per AASM Specifications.   EEG, EOG, Chin and Limb EMG, were sampled at 200 Hz.  ECG, Snore and Nasal Pressure, Thermal Airflow, Respiratory Effort, CPAP Flow and Pressure, Oximetry was sampled at 50 Hz. Digital video and audio were recorded.      BASELINE STUDY  Lights Out was at 22:22 and Lights On at 05:00.  Total recording time (TRT) was 399 minutes, with a total sleep time (TST) of  328 minutes.   The patient's sleep latency was 12 minutes.  REM latency was markedly delayed at 257 minutes.  The sleep efficiency was 82.2 %.     SLEEP ARCHITECTURE: WASO (Wake after sleep onset) was 49.5 minutes with one longer period of wakefulness. There were 5.5 minutes in Stage N1, 225 minutes Stage N2, 66.5 minutes Stage N3 and 31 minutes in Stage REM.  The percentage of Stage N1 was 1.7%, Stage N2 was 68.6%, which is increased, Stage N3 was 20.3% and Stage R (REM sleep) was 9.5%, which is reduced. The arousals were noted as: 42 were spontaneous, 0 were associated with PLMs, 71 were associated with respiratory events.  Audio and video  analysis did not show any abnormal or unusual movements, behaviors, phonations or vocalizations. The patient took 2 bathroom breaks. Moderate to loud snoring was noted. The EKG was in keeping with normal sinus rhythm (NSR).  RESPIRATORY ANALYSIS:  There were a total of 71 respiratory events:  32 obstructive apneas, 0 central apneas and 0 mixed apneas with a total of 32 apneas and an apnea index (AI) of 5.9 /hour. There were 39 hypopneas with a hypopnea index of 7.1 /hour. The patient also had 0 respiratory event related arousals (RERAs).      The total APNEA/HYPOPNEA INDEX (AHI) was 13./hour and the total RESPIRATORY DISTURBANCE INDEX was 13. /hour.  11 events occurred in REM sleep and 68 events in NREM. The REM AHI was 21.3 /hour, versus a non-REM AHI of 12.1. The patient spent 104 minutes of total sleep time in the supine position and 224 minutes in non-supine.. The supine AHI was 29.4 versus a non-supine AHI of 5.3.  OXYGEN SATURATION & C02:  The Wake baseline 02 saturation was 94%, with the lowest being 85%. Time spent below 89% saturation equaled 32 minutes.  PERIODIC LIMB MOVEMENTS: The patient had a total of 0 Periodic Limb Movements.  The Periodic Limb Movement (PLM) index was 0 and the PLM Arousal index was 0/hour.  Post-study, the patient indicated that sleep was the same as usual.   IMPRESSION:  1. Obstructive Sleep Apnea (OSA) 2. Dysfunctions associated with sleep stages or arousal from sleep  RECOMMENDATIONS:  1. This study demonstrates overall mild obstructive sleep apnea, moderate during REM sleep and near-severe during supine sleep. Given the patient's medical history and sleep related complaints, a full-night CPAP titration study is recommended to optimize therapy. Other treatment options may include avoidance of supine sleep position along with weight loss, upper airway or jaw surgery in selected patients or the use of an oral appliance in certain patients. ENT evaluation and/or  consultation with a maxillofacial surgeon or dentist may be feasible in some instances.    2. This study shows sleep fragmentation and abnormal sleep stage percentages; these are nonspecific findings and per se do not signify an intrinsic sleep disorder or a cause for the patient's sleep-related symptoms. Causes include (but are not limited to) the first night effect of the sleep study, circadian rhythm disturbances, medication effect or an underlying mood disorder or medical problem.  3. The patient should be cautioned not to drive, work at heights, or operate dangerous or heavy equipment when tired or sleepy. Review and reiteration of good sleep hygiene measures should be pursued with any patient. 4. The patient will be seen in follow-up by Dr. Rexene Alberts at [facility] for discussion of the test results and further management strategies. The referring provider will be notified of the test results.  I certify that I have reviewed the entire raw data recording prior to the issuance of this report in accordance with the Standards of Accreditation of the American Academy of Sleep Medicine (AASM)   Star Age, MD, PhD Diplomat, American Board of Psychiatry and Neurology (Neurology and Sleep Medicine)

## 2017-11-02 NOTE — Telephone Encounter (Signed)
Pt is returning call. Please call back at  [phone]

## 2017-11-02 NOTE — Telephone Encounter (Signed)
-----   Message from Star Age, MD sent at 11/02/2017  7:54 AM EST ----- Patient referred by Dr. Louis Meckel, seen by me on 09/02/17, diagnostic PSG on 10/15/17.    Please call and notify the patient that the recent sleep study did confirm the diagnosis of mild to moderate (near-severe in supine sleep) obstructive sleep apnea (overall AHI 13/hour, O2 nadir was 85%) and that I recommend treatment for this in the form of CPAP. This will require a repeat sleep study for proper titration and mask fitting. Please explain to patient and arrange for a CPAP titration study. I have placed an order in the chart. Thanks.  Star Age, MD, PhD [facility])

## 2017-11-02 NOTE — Telephone Encounter (Signed)
I called pt to discuss his sleep study results. I spoke to pt's wife, Margaretha Sheffield, per DPR, who asked me to call his work number [phone] and if he does not answer, to not leave a message. I called pt's work number, but he did not answer. Will try again later.

## 2017-11-02 NOTE — Progress Notes (Signed)
Patient referred by Dr. Louis Meckel, seen by me on 09/02/17, diagnostic PSG on 10/15/17.    Please call and notify the patient that the recent sleep study did confirm the diagnosis of mild to moderate (near-severe in supine sleep) obstructive sleep apnea (overall AHI 13/hour, O2 nadir was 85%) and that I recommend treatment for this in the form of CPAP. This will require a repeat sleep study for proper titration and mask fitting. Please explain to patient and arrange for a CPAP titration study. I have placed an order in the chart. Thanks.  Star Age, MD, PhD [facility])

## 2017-11-03 ENCOUNTER — Other Ambulatory Visit: Payer: Self-pay | Admitting: Gastroenterology

## 2017-11-03 DIAGNOSIS — K7469 Other cirrhosis of liver: Secondary | ICD-10-CM

## 2017-11-03 NOTE — Telephone Encounter (Signed)
No-authorization is needed for sleep studies with his insurance. Mike Dunn explained this to him when she scheduled his cpap titration

## 2017-11-11 ENCOUNTER — Ambulatory Visit (INDEPENDENT_AMBULATORY_CARE_PROVIDER_SITE_OTHER): Payer: BLUE CROSS/BLUE SHIELD | Admitting: Neurology

## 2017-11-11 DIAGNOSIS — G4733 Obstructive sleep apnea (adult) (pediatric): Secondary | ICD-10-CM | POA: Diagnosis not present

## 2017-11-11 DIAGNOSIS — G4720 Circadian rhythm sleep disorder, unspecified type: Secondary | ICD-10-CM

## 2017-11-15 ENCOUNTER — Telehealth: Payer: Self-pay

## 2017-11-15 ENCOUNTER — Ambulatory Visit
Admission: RE | Admit: 2017-11-15 | Discharge: 2017-11-15 | Disposition: A | Discharge status: Home / Self Care | Payer: BLUE CROSS/BLUE SHIELD | Source: Ambulatory Visit | Attending: Gastroenterology | Admitting: Gastroenterology

## 2017-11-15 ENCOUNTER — Other Ambulatory Visit: Payer: Self-pay | Admitting: Neurology

## 2017-11-15 DIAGNOSIS — K7469 Other cirrhosis of liver: Secondary | ICD-10-CM

## 2017-11-15 DIAGNOSIS — G4733 Obstructive sleep apnea (adult) (pediatric): Secondary | ICD-10-CM

## 2017-11-15 NOTE — Procedures (Signed)
PATIENT'S NAME:  Mike Dunn, Mike Dunn DOB:      07-03-1956      MR#:    [id number]     DATE OF RECORDING: 11/11/2017 REFERRING M.D.:  Antony Contras, MD Study Performed:   CPAP  Titration HISTORY: 62 year old man with a history of prostatitis, history of UTI, BPH, hyperlipidemia, non alcoholic liver cirrhosis and esophageal varices, and obesity, who returns for a CPAP titration study to treat his OSA.  His PSG from 10/15/2017 showed an AHI of 13 and low spo2 of 85%. The patient's weight 247 pounds with a height of 69 (inches), resulting in a BMI of 36.6 kg/m2.   CURRENT MEDICATIONS: Uroxatral, Co Q 10, Corgard, Fenofibrate, Iron, Fish Oil-Omega 3, Melatonin, Protonix, Align, Crestor   PROCEDURE:  This is a multichannel digital polysomnogram utilizing the SomnoStar 11.2 system.  Electrodes and sensors were applied and monitored per AASM Specifications.   EEG, EOG, Chin and Limb EMG, were sampled at 200 Hz.  ECG, Snore and Nasal Pressure, Thermal Airflow, Respiratory Effort, CPAP Flow and Pressure, Oximetry was sampled at 50 Hz. Digital video and audio were recorded.      The patient was fitted with a medium Dreamwear FFM. CPAP was initiated at 5 cmH20 with heated humidity per AASM standards and pressure was advanced to 8 cmH20 because of hypopneas, apneas and desaturations.  At a PAP pressure of 8 cmH20, there was a reduction of the AHI to 0 with non-supine REM sleep achieved and O2 nadir of 89%.     Lights Out was at 22:41 and Lights On at 05:02. Total recording time (TRT) was 382 minutes, with a total sleep time (TST) of 176 minutes. The patient's sleep latency was 67.5 minutes, which is markedly delayed.  REM latency was 231 minutes, which is markedly delayed. The sleep efficiency was 46.1%, which is markedly reduced.    SLEEP ARCHITECTURE: WASO (Wake after sleep onset)  was 151.5 minutes.  There were 30 minutes in Stage N1, 74 minutes Stage N2, 60.5 minutes Stage N3 and 11.5 minutes in Stage REM.  The  percentage of Stage N1 was 17.%, which is markedly increased, Stage N2 was 42.%, Stage N3 was 34.4%, which is increased, and Stage R (REM sleep) was 6.5%, which is significantly reduced. The arousals were noted as: 23 were spontaneous, 0 were associated with PLMs, 0 were associated with respiratory events.  Audio and video analysis did not show any abnormal or unusual movements, behaviors, phonations or vocalizations. The patient took 3 bathroom breaks. The EKG was in keeping with normal sinus rhythm (NSR).  RESPIRATORY ANALYSIS:  There was a total of 0 respiratory events: 0 obstructive apneas, 0 central apneas and 0 mixed apneas with a total of 0 apneas and an apnea index (AI) of 0 /hour. There were 0 hypopneas with a hypopnea index of 0/hour. The patient also had 0 respiratory event related arousals (RERAs).      The total APNEA/HYPOPNEA INDEX  (AHI) was 0 /hour and the total RESPIRATORY DISTURBANCE INDEX was 0 .hour  0 events occurred in REM sleep and 0 events in NREM. The REM AHI was 0 /hour versus a non-REM AHI of 0 /hour.  The patient spent 65 minutes of total sleep time in the supine position and 111 minutes in non-supine. The supine AHI was 0.0, versus a non-supine AHI of 0.0.  OXYGEN SATURATION & C02:  The baseline 02 saturation was 96%, with the lowest being 89%. Time spent below 89% saturation equaled 0  minutes.  PERIODIC LIMB MOVEMENTS: The patient had a total of 0 Periodic Limb Movements. The Periodic Limb Movement (PLM) index was 0 and the PLM Arousal index was 0 /hour.  Post-study, the patient indicated that sleep was worse than usual.   IMPRESSION:   1. Obstructive Sleep Apnea (OSA) 2. Dysfunctions associated with sleep stages or arousal from sleep   RECOMMENDATIONS:   1. This study was limited due to poor sleep consolidation and poor sleep efficiency; nevertheless, there is resolution of the patient's obstructive sleep apnea with CPAP therapy. I will, therefore, start the  patient on home CPAP treatment at a pressure of 8 cm via medium FFM with heated humidity. The patient should be reminded to be fully compliant with PAP therapy to improve sleep related symptoms and decrease long term cardiovascular risks. The patient should be reminded, that it may take up to 3 months to get fully used to using PAP with all planned sleep. The earlier full compliance is achieved, the better long term compliance tends to be. Please note that untreated obstructive sleep apnea carries additional perioperative morbidity. Patients with significant obstructive sleep apnea should receive perioperative PAP therapy and the surgeons and particularly the anesthesiologist should be informed of the diagnosis and the severity of the sleep disordered breathing. 2. This study shows sleep fragmentation and abnormal sleep stage percentages; these are nonspecific findings and per se do not signify an intrinsic sleep disorder or a cause for the patient's sleep-related symptoms. Causes include (but are not limited to) the first night effect of the sleep study, circadian rhythm disturbances, medication effect or an underlying mood disorder or medical problem.  3. The patient should be cautioned not to drive, work at heights, or operate dangerous or heavy equipment when tired or sleepy. Review and reiteration of good sleep hygiene measures should be pursued with any patient. 4. The patient will be seen in follow-up by Dr. Rexene Alberts at [facility] for discussion of the test results and further management strategies. The referring provider will be notified of the test results.   I certify that I have reviewed the entire raw data recording prior to the issuance of this report in accordance with the Standards of Accreditation of the American Academy of Sleep Medicine (AASM)     Star Age, MD, PhD Diplomat, American Board of Psychiatry and Neurology (Neurology and Sleep Medicine)

## 2017-11-15 NOTE — Progress Notes (Signed)
Patient referred by Dr. Louis Meckel, seen by me on 09/02/17, diagnostic PSG on 10/15/17, CPAP study on 11/11/17.  Please call and inform patient that I have entered an order for treatment with positive airway pressure (PAP) treatment of obstructive sleep apnea (OSA). He did fairly during the latest sleep study with CPAP. We will, therefore, arrange for a machine for home use through a DME (durable medical equipment) company of His choice; and I will see the patient back in follow-up in about 10 weeks. Please also explain to the patient that I will be looking out for compliance data, which can be downloaded from the machine (stored on an SD card, that is inserted in the machine) or via remote access through a modem, that is built into the machine. At the time of the followup appointment we will discuss sleep study results and how it is going with PAP treatment at home. Please advise patient to bring His machine at the time of the first FU visit, even though this is cumbersome. Bringing the machine for every visit after that will likely not be needed, but often helps for the first visit to troubleshoot if needed. Please re-enforce the importance of compliance with treatment and the need for [place] to monitor compliance data - often an insurance requirement and actually good feedback for the patient as far as how they are doing.  Also remind patient, that any interim PAP machine or mask issues should be first addressed with the DME company, as they can often help better with technical and mask fit issues. Please ask if patient has a preference regarding DME company.  Please also make sure, the patient has a follow-up appointment with me in about 10 weeks from the setup date, thanks.  Once you have spoken to the patient - and faxed/routed report to PCP and referring MD (if other than PCP), you can close this encounter, thanks,   Star Age, MD, PhD [facility] ([place])

## 2017-11-15 NOTE — Telephone Encounter (Signed)
-----   Message from Star Age, MD sent at 11/15/2017  8:13 AM EST ----- Patient referred by Dr. Louis Meckel, seen by me on 09/02/17, diagnostic PSG on 10/15/17, CPAP study on 11/11/17.  Please call and inform patient that I have entered an order for treatment with positive airway pressure (PAP) treatment of obstructive sleep apnea (OSA). He did fairly during the latest sleep study with CPAP. We will, therefore, arrange for a machine for home use through a DME (durable medical equipment) company of His choice; and I will see the patient back in follow-up in about 10 weeks. Please also explain to the patient that I will be looking out for compliance data, which can be downloaded from the machine (stored on an SD card, that is inserted in the machine) or via remote access through a modem, that is built into the machine. At the time of the followup appointment we will discuss sleep study results and how it is going with PAP treatment at home. Please advise patient to bring His machine at the time of the first FU visit, even though this is cumbersome. Bringing the machine for every visit after that will likely not be needed, but often helps for the first visit to troubleshoot if needed. Please re-enforce the importance of compliance with treatment and the need for [place] to monitor compliance data - often an insurance requirement and actually good feedback for the patient as far as how they are doing.  Also remind patient, that any interim PAP machine or mask issues should be first addressed with the DME company, as they can often help better with technical and mask fit issues. Please ask if patient has a preference regarding DME company.  Please also make sure, the patient has a follow-up appointment with me in about 10 weeks from the setup date, thanks.  Once you have spoken to the patient - and faxed/routed report to PCP and referring MD (if other than PCP), you can close this encounter, thanks,   Star Age, MD,  PhD [facility] ([place])

## 2017-11-15 NOTE — Telephone Encounter (Signed)
I called pt. I advised pt that Dr. Rexene Alberts reviewed their sleep study results and found that pt did fairly well with the cpap during his latest sleep study. Dr. Rexene Alberts recommends that pt start a cpap at home. I reviewed PAP compliance expectations with the pt. Pt is agreeable to starting a CPAP. I advised pt that an order will be sent to a DME, [facility], and [facility] will call the pt within about one week after they file with the pt's insurance. [facility] will show the pt how to use the machine, fit for masks, and troubleshoot the CPAP if needed. A follow up appt was made for insurance purposes with Dr. Rexene Alberts on [phone]. Pt verbalized understanding to arrive 15 minutes early and bring their CPAP. A letter with all of this information in it will be mailed to the pt as a reminder. I verified with the pt that the address we have on file is correct. Pt verbalized understanding of results. Pt had no questions at this time but was encouraged to call back if questions arise.

## 2017-12-01 DIAGNOSIS — Z08 Encounter for follow-up examination after completed treatment for malignant neoplasm: Secondary | ICD-10-CM | POA: Diagnosis not present

## 2017-12-01 DIAGNOSIS — Z85828 Personal history of other malignant neoplasm of skin: Secondary | ICD-10-CM | POA: Diagnosis not present

## 2017-12-01 DIAGNOSIS — C44319 Basal cell carcinoma of skin of other parts of face: Secondary | ICD-10-CM | POA: Diagnosis not present

## 2017-12-01 DIAGNOSIS — D485 Neoplasm of uncertain behavior of skin: Secondary | ICD-10-CM | POA: Diagnosis not present

## 2017-12-09 DIAGNOSIS — C44319 Basal cell carcinoma of skin of other parts of face: Secondary | ICD-10-CM | POA: Diagnosis not present

## 2017-12-31 DIAGNOSIS — G4733 Obstructive sleep apnea (adult) (pediatric): Secondary | ICD-10-CM | POA: Diagnosis not present

## 2018-01-31 DIAGNOSIS — G4733 Obstructive sleep apnea (adult) (pediatric): Secondary | ICD-10-CM | POA: Diagnosis not present

## 2018-02-09 ENCOUNTER — Encounter (HOSPITAL_BASED_OUTPATIENT_CLINIC_OR_DEPARTMENT_OTHER): Payer: Self-pay | Admitting: *Deleted

## 2018-02-09 ENCOUNTER — Emergency Department (HOSPITAL_BASED_OUTPATIENT_CLINIC_OR_DEPARTMENT_OTHER)
Admission: EM | Admit: 2018-02-09 | Discharge: 2018-02-10 | Disposition: A | Discharge status: Home / Self Care | Payer: BLUE CROSS/BLUE SHIELD | Attending: Emergency Medicine | Admitting: Emergency Medicine

## 2018-02-09 ENCOUNTER — Other Ambulatory Visit: Payer: Self-pay

## 2018-02-09 ENCOUNTER — Emergency Department (HOSPITAL_BASED_OUTPATIENT_CLINIC_OR_DEPARTMENT_OTHER): Payer: BLUE CROSS/BLUE SHIELD

## 2018-02-09 DIAGNOSIS — L03115 Cellulitis of right lower limb: Secondary | ICD-10-CM | POA: Insufficient documentation

## 2018-02-09 DIAGNOSIS — E119 Type 2 diabetes mellitus without complications: Secondary | ICD-10-CM | POA: Diagnosis not present

## 2018-02-09 DIAGNOSIS — M7989 Other specified soft tissue disorders: Secondary | ICD-10-CM | POA: Diagnosis not present

## 2018-02-09 DIAGNOSIS — Z79899 Other long term (current) drug therapy: Secondary | ICD-10-CM | POA: Insufficient documentation

## 2018-02-09 DIAGNOSIS — R2241 Localized swelling, mass and lump, right lower limb: Secondary | ICD-10-CM | POA: Diagnosis not present

## 2018-02-09 DIAGNOSIS — R600 Localized edema: Secondary | ICD-10-CM | POA: Diagnosis not present

## 2018-02-09 DIAGNOSIS — Z85828 Personal history of other malignant neoplasm of skin: Secondary | ICD-10-CM | POA: Insufficient documentation

## 2018-02-09 MED ORDER — SODIUM CHLORIDE 0.9 % IV SOLN
1.0000 g | Freq: Once | INTRAVENOUS | Status: AC
  Administered 2018-02-10: 1 g via INTRAVENOUS
  Filled 2018-02-09: qty 10

## 2018-02-09 NOTE — ED Provider Notes (Signed)
[facility] EMERGENCY DEPARTMENT Provider Note   CSN: [id number] Arrival date & time: 02/09/18  2112     History   Chief Complaint Chief Complaint  Patient presents with  . Leg Swelling    HPI Mike Dunn is a 62 y.o. male.  Patient is a 62 year old male with past medical history of DM, BPH, non-alcoholic cirrhosis.  He presents today with complaints of intermittent swelling of the right lower leg.  This began in the absence of any injury or trauma.  He does report recent travel to [place] to visit family.  He denies any chest pain or shortness of breath.  Symptoms improve with elevation, worse with walking and spending time on his feet.  The history is provided by the patient.    Past Medical History:  Diagnosis Date  . BPH (benign prostatic hyperplasia)   . Cancer ([place])   . Cirrhosis, nonalcoholic ([place])   . Diabetes mellitus without complication ([place])   . Hyperlipidemia   . Obesity   . Prostatitis, chronic   . Spermatocele of epididymis 2014    There are no active problems to display for this patient.   Past Surgical History:  Procedure Laterality Date  . BASAL CELL CARCINOMA EXCISION    . CARPAL TUNNEL RELEASE    . HERNIA REPAIR     ventral and inguinal   . MOUTH SURGERY    . TONSILLECTOMY          Home Medications    Prior to Admission medications   Medication Sig Start Date End Date Taking? Authorizing Provider  alfuzosin (UROXATRAL) 10 MG 24 hr tablet Take 10 mg daily by mouth. 08/23/17   [provider]  co-enzyme Q-10 50 MG capsule Take 50 mg by mouth daily.      [provider]  CORGARD 40 MG tablet Take 40 mg daily by mouth. 08/23/17   [provider]  fenofibrate 160 MG tablet Take 160 mg daily by mouth.    [provider]  ferrous sulfate 325 (65 FE) MG tablet Take 325 mg daily with breakfast by mouth.    [provider]  fish oil-omega-3 fatty acids 1000 MG capsule Take 1 g by  mouth daily.      [provider]  Melatonin 10 MG CAPS Take by mouth.    [provider]  Multiple Vitamins-Minerals (MULTI FOR HIM 50+ PO) Take 1 tablet by mouth daily.      [provider]  pantoprazole (PROTONIX) 40 MG tablet Take 40 mg by mouth daily.    [provider]  Probiotic Product (ALIGN) 4 MG CAPS Take 4 mg by mouth daily.    [provider]  rosuvastatin (CRESTOR) 10 MG tablet Take 10 mg by mouth daily.      [provider]    Family History Family History  Problem Relation Age of Onset  . Hypertension Mother   . Heart disease Father   . Nephrolithiasis Brother     Social History Social History   Tobacco Use  . Smoking status: Never Smoker  . Smokeless tobacco: Never Used  Substance Use Topics  . Alcohol use: No  . Drug use: No     Allergies   Tamsulosin and Zocor [simvastatin]   Review of Systems Review of Systems  All other systems reviewed and are negative.    Physical Exam Updated Vital Signs BP 140/85   Pulse 74   Temp 98 F (  36.7 C) (Oral)   Resp 16   Ht 5\' 9"  (1.753 m)   Wt 113.4 kg (250 lb)   SpO2 97%   BMI 36.92 kg/m   Physical Exam  Constitutional: He is oriented to person, place, and time. He appears well-developed and well-nourished. No distress.  HENT:  Head: Normocephalic and atraumatic.  Mouth/Throat: Oropharynx is clear and moist.  Neck: Normal range of motion. Neck supple.  Cardiovascular: Normal rate and regular rhythm. Exam reveals no friction rub.  No murmur heard. Pulmonary/Chest: Effort normal and breath sounds normal. No respiratory distress. He has no wheezes. He has no rales.  Abdominal: Soft. Bowel sounds are normal. He exhibits no distension. There is no tenderness.  Musculoskeletal: Normal range of motion. He exhibits edema.  There is 2+ pitting edema of the right lower leg.  DP pulses are palpable.  The lower leg does appear to have some erythema and warmth  just above the ankle.  Neurological: He is alert and oriented to person, place, and time. Coordination normal.  Skin: Skin is warm and dry. He is not diaphoretic.  Nursing note and vitals reviewed.    ED Treatments / Results  Labs (all labs ordered are listed, but only abnormal results are displayed) Labs Reviewed  BASIC METABOLIC PANEL  CBC WITH DIFFERENTIAL/PLATELET    EKG None  Radiology US Venous Img Lower Unilateral Right  Result Date: 02/09/2018 CLINICAL DATA:  RIGHT leg swelling for 4 days after driving for 3 hours. EXAM: RIGHT LOWER EXTREMITY VENOUS DOPPLER ULTRASOUND TECHNIQUE: Gray-scale sonography with graded compression, as well as color Doppler and duplex ultrasound were performed to evaluate the lower extremity deep venous systems from the level of the common femoral vein and including the common femoral, femoral, profunda femoral, popliteal and calf veins including the posterior tibial, peroneal and gastrocnemius veins when visible. The superficial great saphenous vein was also interrogated. Spectral Doppler was utilized to evaluate flow at rest and with distal augmentation maneuvers in the common femoral, femoral and popliteal veins. COMPARISON:  None. FINDINGS: Contralateral Common Femoral Vein: Respiratory phasicity is normal and symmetric with the symptomatic side. No evidence of thrombus. Normal compressibility. Common Femoral Vein: No evidence of thrombus. Normal compressibility, respiratory phasicity and response to augmentation. Saphenofemoral Junction: No evidence of thrombus. Normal compressibility and flow on color Doppler imaging. Profunda Femoral Vein: No evidence of thrombus. Normal compressibility and flow on color Doppler imaging. Femoral Vein: No evidence of thrombus. Normal compressibility, respiratory phasicity and response to augmentation. Popliteal Vein: No evidence of thrombus. Normal compressibility, respiratory phasicity and response to augmentation. Calf  Veins: No evidence of thrombus. Normal compressibility and flow on color Doppler imaging. Superficial Great Saphenous Vein: No evidence of thrombus. Normal compressibility. Venous Reflux:  None. Other Findings:  Interstitial edema of the lower extremity. IMPRESSION: No evidence of RIGHT deep venous thrombosis. Electronically Signed   By: Elon Alas M.D.   On: 02/09/2018 22:44    Procedures Procedures (including critical care time)  Medications Ordered in ED Medications  cefTRIAXone (ROCEPHIN) 1 g in sodium chloride 0.9 % 100 mL IVPB (has no administration in time range)     Initial Impression / Assessment and Plan / ED Course  I have reviewed the triage vital signs and the nursing notes.  Pertinent labs & imaging results that were available during my care of the patient were reviewed by me and considered in my medical decision making (see chart for details).  Patient presents with right leg swelling intermittently  for the past week.  He does report recent travel, however ultrasound is negative.  There is some erythema to the leg along with warmth that I suspect may be a cellulitis.  He was given IV Rocephin and will be discharged with Keflex.  Final Clinical Impressions(s) / ED Diagnoses   Final diagnoses:  None    ED Discharge Orders    None       Veryl Speak, MD 02/10/18 [phone]

## 2018-02-09 NOTE — ED Notes (Signed)
Patient transported to Ultrasound 

## 2018-02-09 NOTE — ED Triage Notes (Addendum)
Pt c/o right lower leg swelling x 5 days ,long car ride over weekend

## 2018-02-09 NOTE — ED Notes (Signed)
ED Provider at bedside. 

## 2018-02-10 LAB — CBC WITH DIFFERENTIAL/PLATELET
Basophils Absolute: 0 10*3/uL (ref 0.0–0.1)
Basophils Relative: 0 %
Eosinophils Absolute: 0.2 10*3/uL (ref 0.0–0.7)
Eosinophils Relative: 3 %
HCT: 40.4 % (ref 39.0–52.0)
Hemoglobin: 14.3 g/dL (ref 13.0–17.0)
Lymphocytes Relative: 23 %
Lymphs Abs: 1 10*3/uL (ref 0.7–4.0)
MCH: 34.2 pg — ABNORMAL HIGH (ref 26.0–34.0)
MCHC: 35.4 g/dL (ref 30.0–36.0)
MCV: 96.7 fL (ref 78.0–100.0)
Monocytes Absolute: 0.5 10*3/uL (ref 0.1–1.0)
Monocytes Relative: 11 %
Neutro Abs: 2.9 10*3/uL (ref 1.7–7.7)
Neutrophils Relative %: 63 %
Platelets: 123 10*3/uL — ABNORMAL LOW (ref 150–400)
RBC: 4.18 MIL/uL — ABNORMAL LOW (ref 4.22–5.81)
RDW: 13.3 % (ref 11.5–15.5)
WBC: 4.6 10*3/uL (ref 4.0–10.5)

## 2018-02-10 LAB — BASIC METABOLIC PANEL
Anion gap: 10 (ref 5–15)
BUN: 15 mg/dL (ref 6–20)
CO2: 22 mmol/L (ref 22–32)
Calcium: 8.5 mg/dL — ABNORMAL LOW (ref 8.9–10.3)
Chloride: 104 mmol/L (ref 101–111)
Creatinine, Ser: 0.6 mg/dL — ABNORMAL LOW (ref 0.61–1.24)
GFR calc Af Amer: >60 mL/min (ref >60)
GFR calc non Af Amer: >60 mL/min (ref >60)
Glucose, Bld: 138 mg/dL — ABNORMAL HIGH (ref 65–99)
Potassium: 3.6 mmol/L (ref 3.5–5.1)
Sodium: 136 mmol/L (ref 135–145)

## 2018-02-10 MED ORDER — CEPHALEXIN 500 MG PO CAPS: 500.0000 mg | ORAL_CAPSULE | Freq: Four times a day (QID) | ORAL | 0 refills | Status: DC

## 2018-02-10 NOTE — Discharge Instructions (Addendum)
Keflex as prescribed.  Follow-up with your primary doctor if symptoms are not improving in the next 3-4 days, and return to the ER if you develop worsening redness, worsening pain, high fevers, or other new and concerning symptoms.

## 2018-02-12 DIAGNOSIS — L03115 Cellulitis of right lower limb: Secondary | ICD-10-CM | POA: Diagnosis not present

## 2018-02-14 ENCOUNTER — Encounter: Payer: Self-pay | Admitting: Neurology

## 2018-02-16 ENCOUNTER — Ambulatory Visit (INDEPENDENT_AMBULATORY_CARE_PROVIDER_SITE_OTHER): Payer: BLUE CROSS/BLUE SHIELD | Admitting: Neurology

## 2018-02-16 ENCOUNTER — Encounter: Payer: Self-pay | Admitting: Neurology

## 2018-02-16 VITALS — BP 120/75 | HR 72 | Ht 69.0 in | Wt 244.0 lb

## 2018-02-16 DIAGNOSIS — Z9989 Dependence on other enabling machines and devices: Secondary | ICD-10-CM | POA: Diagnosis not present

## 2018-02-16 DIAGNOSIS — L03115 Cellulitis of right lower limb: Secondary | ICD-10-CM

## 2018-02-16 DIAGNOSIS — G4733 Obstructive sleep apnea (adult) (pediatric): Secondary | ICD-10-CM

## 2018-02-16 NOTE — Progress Notes (Signed)
Subjective:    Patient ID: Mike Dunn is a 62 y.o. male.  HPI     Interim history:  Mike Dunn is a 62 year old right-handed gentleman with an underlying medical history of prostatitis, history of UTI, BPH, hyperlipidemia, non alcoholic liver cirrhosis and esophageal varices, and obesity, who presents for follow-up consultation of his obstructive sleep apnea, after sleep study testing and starting CPAP therapy. The patient is unaccompanied today. I first met him on 09/03/2007. At the request of his urologist, at which time he reported snoring and daytime somnolence as well as significant nocturia. I suggested we proceed with a sleep study for concern for underlying OSA. He had a baseline sleep study, followed by a CPAP titration study. I went over his test results with him in detail today. Baseline sleep study from 10/15/2017 showed a sleep latency of 12 minutes, REM latency markedly delayed at 257 minutes and sleep efficiency was 82.2%. He had an increased percentage of stage II sleep, slow-wave sleep was adequate and REM sleep was reduced at 9.5%. Total AHI was in the mild range at 13.0 per hour, REM AHI in the moderate range at 21.3 per hour and supine AHI was also in the near severe range at 29.4 per hour, average oxygen saturation was 94%, nadir was 85%. He had no significant PLMS. Based on his sleep related complaints and sleep study results I suggested we proceed with CPAP treatment. He had a CPAP titration study on 11/11/2017. Sleep efficiency was only 46.1%, sleep latency delayed at 67.5 minutes, REM latency also delayed at 231 minutes. He had an increased percentage of slow-wave sleep and a markedly decreased percentage of REM sleep at 6.5%. He was fitted with a medium fullface mask and CPAP was titrated from 5 cm to 8 cm. On the final pressure his AHI was 0 per hour with nonsupine REM sleep achieved an O2 nadir of 89%. He had no significant PLMS. Based on his test results I prescribed CPAP  therapy for home use at a pressure of 8 cm via full facemask.  Today, 02/16/2018: I reviewed his CPAP compliance data from 01/16/2018 through 02/14/2018 which is a total of 30 days, during which time he used his CPAP every night with percent used days greater than 4 hours at 97%, indicating excellent compliance with an average usage of 7 hours and 17 minutes, residual AHI at goal at 0.5 per hour, leak acceptable with the 95th percentile at 10.4 L/m on a pressure of 8 cm with EPR of 3. He reports feeling better including better sleep consolidation sleep quality, nocturia is much better. He is quite pleased with his outcome and surprised that he was able to adapt to CPAP therapy as quickly as he did. He is motivated and willing to continue. Unfortunately, he had developed cellulitis about a week ago, had to be in the emergency room, that is the only day during which his CPAP usage was less than 4 hours which is understandable as he spent some of the night in the ER. He was treated with IV Rocephin and is finishing up on his oral Keflex. He is feeling better in that regard as well. He still has residual swelling.  The patient's allergies, current medications, family history, past medical history, past social history, past surgical history and problem list were reviewed and updated as appropriate.   Previously (copied from previous notes for reference):   09/02/2017: (He) reports snoring and excessive daytime somnolence as well as nocturia. I reviewed  your office note from 07/29/2017, which you kindly included. He does not sleep through the night. His Epworth sleepiness score is 9 out of 24, fatigue score is 52 out of 63. He lives with his wife. They have 2 children. He is a nonsmoker and does not utilize alcohol, he drinks caffeine in the form of coffee, usually one cup per day.  He reports stress at work. He had a change in his position earlier this year. He tries to be in bed around 8:30 or 9, he often wakes  up multiple times throughout the night, often to go to the bathroom. He has nocturia up to 4 times each night. Lately with a new bladder medication it improved to about 2 times per night. He does not have morning headaches. He had a tonsillectomy and adenoidectomy at age 89 or 67. His younger brother has sleep apnea and has a CPAP machine. His father most likely had sleep apnea as well. Patient takes melatonin 3 mg at night. His rise time is 5 AM. He often takes a nap in the afternoons. He wakes up gasping and with palpitations at times. He does not have telltale symptoms of restless leg syndrome. His wife sleeps in a separate bedroom typically.  His Past Medical History Is Significant For: Past Medical History:  Diagnosis Date  . BPH (benign prostatic hyperplasia)   . Cancer ([place])   . Cirrhosis, nonalcoholic ([place])   . Diabetes mellitus without complication ([place])   . Hyperlipidemia   . Obesity   . Prostatitis, chronic   . Spermatocele of epididymis 2014    His Past Surgical History Is Significant For: Past Surgical History:  Procedure Laterality Date  . BASAL CELL CARCINOMA EXCISION    . CARPAL TUNNEL RELEASE    . HERNIA REPAIR     ventral and inguinal   . MOUTH SURGERY    . TONSILLECTOMY      His Family History Is Significant For: Family History  Problem Relation Age of Onset  . Hypertension Mother   . Heart disease Father   . Nephrolithiasis Brother     His Social History Is Significant For: Social History   Socioeconomic History  . Marital status: Married    Spouse name: Not on file  . Number of children: Not on file  . Years of education: Not on file  . Highest education level: Not on file  Occupational History  . Not on file  Social Needs  . Financial resource strain: Not on file  . Food insecurity:    Worry: Not on file    Inability: Not on file  . Transportation needs:    Medical: Not on file    Non-medical: Not on file  Tobacco Use  . Smoking status: Never  Smoker  . Smokeless tobacco: Never Used  Substance and Sexual Activity  . Alcohol use: No  . Drug use: No  . Sexual activity: Never  Lifestyle  . Physical activity:    Days per week: Not on file    Minutes per session: Not on file  . Stress: Not on file  Relationships  . Social connections:    Talks on phone: Not on file    Gets together: Not on file    Attends religious service: Not on file    Active member of club or organization: Not on file    Attends meetings of clubs or organizations: Not on file    Relationship status: Not on file  Other Topics  Concern  . Not on file  Social History Narrative  . Not on file    His Allergies Are:  Allergies  Allergen Reactions  . Tamsulosin   . Zocor [Simvastatin] Other (See Comments)    Caused numbness  :   His Current Medications Are:  Outpatient Encounter Medications as of 02/16/2018  Medication Sig  . alfuzosin (UROXATRAL) 10 MG 24 hr tablet Take 10 mg daily by mouth.  . cephALEXin (KEFLEX) 500 MG capsule Take 1 capsule (500 mg total) by mouth 4 (four) times daily.  Marland Kitchen co-enzyme Q-10 50 MG capsule Take 50 mg by mouth daily.    . CORGARD 40 MG tablet Take 40 mg daily by mouth.  . fenofibrate 160 MG tablet Take 160 mg daily by mouth.  . ferrous sulfate 325 (65 FE) MG tablet Take 325 mg daily with breakfast by mouth.  . fish oil-omega-3 fatty acids 1000 MG capsule Take 1 g by mouth daily.    . Melatonin 10 MG CAPS Take by mouth.  . Multiple Vitamins-Minerals (MULTI FOR HIM 50+ PO) Take 1 tablet by mouth daily.    . pantoprazole (PROTONIX) 40 MG tablet Take 40 mg by mouth daily.  . Probiotic Product (ALIGN) 4 MG CAPS Take 4 mg by mouth daily.  . rosuvastatin (CRESTOR) 10 MG tablet Take 10 mg by mouth daily.     No facility-administered encounter medications on file as of 02/16/2018.   :  Review of Systems:  Out of a complete 14 point review of systems, all are reviewed and negative with the exception of these symptoms as listed  below:  Review of Systems  Neurological:       Pt presents today to discuss his cpap. Pt is feeling better on cpap therapy. Pt has cellulitis in his leg.    Objective:  Neurological Exam  Physical Exam Physical Examination:   Vitals:   02/16/18 1507  BP: 120/75  Pulse: 72    General Examination: The patient is a very pleasant 62 y.o. male in no acute distress. He appears well-developed and well-nourished and well groomed.   HEENT: Normocephalic, atraumatic, pupils are equal, round and reactive to light and accommodation. He wears corrective eyeglasses. He has mild bilateral cataracts. Extraocular tracking is good without limitation to gaze excursion or nystagmus noted. Normal smooth pursuit is noted. Hearing is grossly intact. He has bilateral hearing aids. Face is symmetric with normal facial animation and normal facial sensation. Speech is clear with no dysarthria noted. There is no hypophonia. There is no lip, neck/head, jaw or voice tremor. Neck is supple with full range of passive and active motion. Oropharynx exam reveals: mild mouth dryness, and moderate airway crowding. Tonsils are absent.   Chest: Clear to auscultation without wheezing, rhonchi or crackles noted.  Heart: S1+S2+0, regular and normal without murmurs, rubs or gallops noted.   Abdomen: Soft, non-tender and non-distended with normal bowel sounds appreciated on auscultation.  Extremities: There is trace pitting edema in the left lower extremity, redness and swelling noted in the right lower extremity.  Skin: Warm and dry without trophic changes noted.  Musculoskeletal: exam reveals no obvious joint deformities, tenderness or joint swelling or erythema.   Neurologically:  Mental status: The patient is awake, alert and oriented in all 4 spheres. Her immediate and remote memory, attention, language skills and fund of knowledge are appropriate. There is no evidence of aphasia, agnosia, apraxia or anomia.  Speech is clear with normal prosody and enunciation. Thought process  is linear. Mood is normal and affect is normal.  Cranial nerves II - XII are as described above under HEENT exam.  Motor exam: Normal bulk, strength and tone is noted. There is no drift, tremor or rebound. Fine motor skills and coordination: grossly intact.  Cerebellar testing: No dysmetria or intention tremor. There is no truncal or gait ataxia.  Sensory exam: intact to light touch in the upper and lower extremities.  Gait, station and balance: He stands easily. No veering to one side is noted. No leaning to one side is noted. Posture is age-appropriate and stance is narrow based. Gait shows normal stride length and normal pace. No problems turning are noted.    Assessment and Plan:   In summary, Mike Dunn is a very pleasant 62 year old male with an underlying medical history of prostatitis, history of UTI, BPH, hyperlipidemia, non alcoholic liver cirrhosis and esophageal varices, and obesity, who Presents for follow-up consultation of his obstructive sleep apnea, after sleep study testing. He had a baseline sleep study, followed by a CPAP titration study. He has established treatment with CPAP. Overall, his baseline sleep study showed mild sleep apnea but because of his symptoms he was advised to pursue treatment. He has done quite well with CPAP therapy, feels improved in terms of his nocturia, sleep quality, sleep consolidation and daytime symptoms. He is highly commended for his treatment adherence and advised to continue with CPAP therapy as is. He has developed cellulitis a week ago and is still completing his oral anti-biotic. He also reports that he sleeps with his legs elevated because of his swelling which makes his sleep more difficult currently. I suggested a routine 6 month follow-up with one of our nurse practitioners and hopefully yearly thereafter if he continues to do well. I answered all his questions today and  the patient was in agreement. I spent 25 minutes in total face-to-face time with the patient, more than 50% of which was spent in counseling and coordination of care, reviewing test results, reviewing medication and discussing or reviewing the diagnosis of OSA, its prognosis and treatment options. Pertinent laboratory and imaging test results that were available during this visit with the patient were reviewed by me and considered in my medical decision making (see chart for details).

## 2018-02-16 NOTE — Patient Instructions (Signed)
Please continue using your CPAP regularly. While your insurance requires that you use CPAP at least 4 hours each night on 70% of the nights, I recommend, that you not skip any nights and use it throughout the night if you can. Getting used to CPAP and staying with the treatment long term does take time and patience and discipline. Untreated obstructive sleep apnea when it is moderate to severe can have an adverse impact on cardiovascular health and raise her risk for heart disease, arrhythmias, hypertension, congestive heart failure, stroke and diabetes. Untreated obstructive sleep apnea causes sleep disruption, nonrestorative sleep, and sleep deprivation. This can have an impact on your day to day functioning and cause daytime sleepiness and impairment of cognitive function, memory loss, mood disturbance, and problems focussing. Using CPAP regularly can improve these symptoms.  Keep up the good work! We will see you back in 6 months for sleep apnea check up, and if you continue to do well on CPAP, once a year thereafter.

## 2018-02-28 ENCOUNTER — Other Ambulatory Visit: Payer: Self-pay

## 2018-02-28 ENCOUNTER — Encounter (HOSPITAL_BASED_OUTPATIENT_CLINIC_OR_DEPARTMENT_OTHER): Payer: Self-pay | Admitting: *Deleted

## 2018-02-28 ENCOUNTER — Emergency Department (HOSPITAL_BASED_OUTPATIENT_CLINIC_OR_DEPARTMENT_OTHER)
Admission: EM | Admit: 2018-02-28 | Discharge: 2018-02-28 | Disposition: A | Discharge status: Home / Self Care | Payer: BLUE CROSS/BLUE SHIELD | Attending: Emergency Medicine | Admitting: Emergency Medicine

## 2018-02-28 ENCOUNTER — Emergency Department (HOSPITAL_BASED_OUTPATIENT_CLINIC_OR_DEPARTMENT_OTHER): Payer: BLUE CROSS/BLUE SHIELD

## 2018-02-28 DIAGNOSIS — Z85828 Personal history of other malignant neoplasm of skin: Secondary | ICD-10-CM | POA: Insufficient documentation

## 2018-02-28 DIAGNOSIS — Z859 Personal history of malignant neoplasm, unspecified: Secondary | ICD-10-CM | POA: Diagnosis not present

## 2018-02-28 DIAGNOSIS — M25561 Pain in right knee: Secondary | ICD-10-CM | POA: Diagnosis not present

## 2018-02-28 DIAGNOSIS — M2500 Hemarthrosis, unspecified joint: Secondary | ICD-10-CM

## 2018-02-28 DIAGNOSIS — M25061 Hemarthrosis, right knee: Secondary | ICD-10-CM | POA: Insufficient documentation

## 2018-02-28 DIAGNOSIS — M25461 Effusion, right knee: Secondary | ICD-10-CM | POA: Diagnosis not present

## 2018-02-28 DIAGNOSIS — Z79899 Other long term (current) drug therapy: Secondary | ICD-10-CM | POA: Insufficient documentation

## 2018-02-28 DIAGNOSIS — E119 Type 2 diabetes mellitus without complications: Secondary | ICD-10-CM | POA: Diagnosis not present

## 2018-02-28 LAB — CBC
HCT: 42.1 % (ref 39.0–52.0)
Hemoglobin: 14.9 g/dL (ref 13.0–17.0)
MCH: 34.3 pg — ABNORMAL HIGH (ref 26.0–34.0)
MCHC: 35.4 g/dL (ref 30.0–36.0)
MCV: 96.8 fL (ref 78.0–100.0)
Platelets: 70 10*3/uL — ABNORMAL LOW (ref 150–400)
RBC: 4.35 MIL/uL (ref 4.22–5.81)
RDW: 12.9 % (ref 11.5–15.5)
WBC: 5.9 10*3/uL (ref 4.0–10.5)

## 2018-02-28 MED ORDER — HYDROCODONE-ACETAMINOPHEN 5-325 MG PO TABS
1.0000 | ORAL_TABLET | Freq: Once | ORAL | Status: AC
  Administered 2018-02-28: 1 via ORAL
  Filled 2018-02-28: qty 1

## 2018-02-28 MED ORDER — HYDROCODONE-ACETAMINOPHEN 5-325 MG PO TABS: 1.0000 | ORAL_TABLET | Freq: Four times a day (QID) | ORAL | 0 refills | Status: DC | PRN

## 2018-02-28 NOTE — ED Triage Notes (Signed)
Pt seen here on April 17 and dx with cellulitis of right leg. Today he reports increase in pain and swelling. States he is having difficulty walking this evening due to pain

## 2018-02-28 NOTE — Discharge Instructions (Signed)
You were seen today for right knee pain and swelling.  Your work-up was concerning for a hemarthrosis which is usually related to a bleeding disorder or injury.  Given that you do not have any history of either, the cause is unclear.  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  Keep the extremity iced and elevated.  Use crutches and knee immobilizer for symptom control.  He will be given a short course of pain medication.  You need to follow-up very mostly with orthopedics.  If you note overlying skin changes, redness, fevers, you need to be reevaluated immediately.

## 2018-02-28 NOTE — ED Provider Notes (Signed)
[facility] EMERGENCY DEPARTMENT Provider Note   CSN: [id number] Arrival date & time: 02/28/18  0056     History   Chief Complaint Chief Complaint  Patient presents with  . Leg Pain    HPI Mike Dunn is a 62 y.o. male.  HPI  This is a 62 year old male with a history of diabetes, hyperlipidemia who presents with right knee pain.  Patient reports acute onset of right knee pain yesterday morning.  He states that he woke up and had progressively worsening right knee pain over the day.  He denies injury.  He states at times his pain is 10 out of 10.  It makes it difficult to ambulate and pain is worsened with ambulation.  He denies any history of gout.  Denies any injury.  He did recently get treated for cellulitis of the right calf.  He states that this was much improved with antibiotics after approximately 1 week.  He has not noted any overlying skin changes but has noted significant swelling to the knee.  Patient took an Aleve without any relief of symptoms.  Past Medical History:  Diagnosis Date  . BPH (benign prostatic hyperplasia)   . Cancer ([place])   . Cirrhosis, nonalcoholic ([place])   . Diabetes mellitus without complication ([place])   . Hyperlipidemia   . Obesity   . Prostatitis, chronic   . Spermatocele of epididymis 2014    There are no active problems to display for this patient.   Past Surgical History:  Procedure Laterality Date  . BASAL CELL CARCINOMA EXCISION    . CARPAL TUNNEL RELEASE    . HERNIA REPAIR     ventral and inguinal   . MOUTH SURGERY    . TONSILLECTOMY          Home Medications    Prior to Admission medications   Medication Sig Start Date End Date Taking? Authorizing Provider  alfuzosin (UROXATRAL) 10 MG 24 hr tablet Take 10 mg daily by mouth. 08/23/17   [provider]  cephALEXin (KEFLEX) 500 MG capsule Take 1 capsule (500 mg total) by mouth 4 (four) times daily. 02/10/18   Veryl Speak, MD  co-enzyme Q-10 50 MG capsule  Take 50 mg by mouth daily.      [provider]  CORGARD 40 MG tablet Take 40 mg daily by mouth. 08/23/17   [provider]  fenofibrate 160 MG tablet Take 160 mg daily by mouth.    [provider]  ferrous sulfate 325 (65 FE) MG tablet Take 325 mg daily with breakfast by mouth.    [provider]  fish oil-omega-3 fatty acids 1000 MG capsule Take 1 g by mouth daily.      [provider]  HYDROcodone-acetaminophen (NORCO/VICODIN) 5-325 MG tablet Take 1 tablet by mouth every 6 (six) hours as needed. 02/28/18   Horton, Barbette Hair, MD  Melatonin 10 MG CAPS Take by mouth.    [provider]  Multiple Vitamins-Minerals (MULTI FOR HIM 50+ PO) Take 1 tablet by mouth daily.      [provider]  pantoprazole (PROTONIX) 40 MG tablet Take 40 mg by mouth daily.    [provider]  Probiotic Product (ALIGN) 4 MG CAPS Take 4 mg by mouth daily.    [provider]  rosuvastatin (CRESTOR) 10 MG tablet Take 10 mg by mouth daily.      [provider]    Family History Family History  Problem Relation Age  of Onset  . Hypertension Mother   . Heart disease Father   . Nephrolithiasis Brother     Social History Social History   Tobacco Use  . Smoking status: Never Smoker  . Smokeless tobacco: Never Used  Substance Use Topics  . Alcohol use: No  . Drug use: No     Allergies   Tamsulosin and Zocor [simvastatin]   Review of Systems Review of Systems  Constitutional: Negative for fever.  Musculoskeletal:       Right knee pain and swelling  Skin: Negative for color change and wound.  All other systems reviewed and are negative.    Physical Exam Updated Vital Signs BP 121/67 (BP Location: Right Arm)   Pulse 64   Temp 98.2 F (36.8 C) (Oral)   Resp 16   Ht 5\' 9"  (1.753 m)   Wt 111.1 kg (245 lb)   SpO2 95%   BMI 36.18 kg/m   Physical Exam  Constitutional: He is oriented to person, place, and time. He  appears well-developed and well-nourished. No distress.  Overweight  HENT:  Head: Normocephalic and atraumatic.  Cardiovascular: Normal rate and normal heart sounds.  Pulmonary/Chest: Effort normal. No respiratory distress.  Abdominal: Soft. There is no tenderness.  Musculoskeletal: He exhibits no edema.  Large palpable effusion of the right knee, decreased range of motion secondary to pain, no overlying skin changes or erythema, slight swelling noted to the bilateral feet, 2+ DP pulses bilaterally  Lymphadenopathy:    He has no cervical adenopathy.  Neurological: He is alert and oriented to person, place, and time.  Skin: Skin is warm and dry.  Psychiatric: He has a normal mood and affect.  Nursing note and vitals reviewed.    ED Treatments / Results  Labs (all labs ordered are listed, but only abnormal results are displayed) Labs Reviewed  CBC - Abnormal; Notable for the following components:      Result Value   MCH 34.3 (*)    Platelets 70 (*)    All other components within normal limits    EKG None  Radiology Ct Knee Right Wo Contrast  Result Date: 02/28/2018 CLINICAL DATA:  62 year old male with right knee pain. Recent diagnosis of cellulitis of the right leg. EXAM: CT OF THE right KNEE WITHOUT CONTRAST TECHNIQUE: Multidetector CT imaging of the right knee was performed according to the standard protocol. Multiplanar CT image reconstructions were also generated. COMPARISON:  Right knee radiograph dated 02/28/2018 FINDINGS: Bones/Joint/Cartilage There is a curvilinear bone arising from the anterior intercondylar notch of the inter articular surface of the femur which is chronic. Mild chronic spurring of the tibial spine is also noted. There is no acute fracture or dislocation. There is a large amount of suprapatellar effusion containing high attenuating content which may represent hemarthrosis. Clinical correlation is recommended. Ligaments Suboptimally assessed by CT. Muscles  and Tendons No acute intramuscular pathology.  No fluid collection or hematoma. Soft tissues There is a 1.7 x 1.3 cm low attenuating structure in the popliteal fossa, likely a cyst. Ill-defined 14 x 17 mm ovoid low attenuating structure posterior to the medial condyle (series 8, image 191) is not well evaluated on this CT but may represent a Baker's cyst. Ultrasound or MRI may provide better evaluation. There is diffuse skin thickening and subcutaneous soft tissue edema. IMPRESSION: 1. No acute fracture or dislocation. Small bone spurring of the articular surface of the knee, likely mild arthritic changes. 2. Large suprapatellar effusion with high attenuating content  which may represent hemarthrosis. Clinical correlation is recommended. 3. Probable Baker's cyst. MRI may provide better evaluation of the joint and effusion. 4. Skin thickening and diffuse subcutaneous edema may represent cellulitis. Clinical correlation is recommended. Electronically Signed   By: Anner Crete M.D.   On: 02/28/2018 05:42   Dg Knee Complete 4 Views Right  Result Date: 02/28/2018 CLINICAL DATA:  Acute onset right knee are the McConnell pain and swelling this evening with inability to bear weight. Recent cellulitis. EXAM: RIGHT KNEE - COMPLETE 4+ VIEW COMPARISON:  CT 08/22/2007 FINDINGS: Moderate suprapatellar joint effusion. Mild-to-moderate joint space narrowing of the femorotibial compartment. Osteoarthritis of the patellofemoral compartment with spurring off the upper and lower pole of the patella. There is a curvilinear bony density projecting within the intercondylar notch of the distal femur without definite donor site. A fracture is not entirely excluded. There is slight cortical irregularity suggested along the posterior aspect of the medial femoral condyle on lateral view as well. CT of the right knee is recommended to exclude a subtle fracture. IMPRESSION: 1. Curvilinear bony density is suggested within the intercondylar  notch on the AP view possibly representing a subtle fracture fragment. CT is recommended for further correlation. 2. Moderate joint effusion is also noted as well as cortical of lucencies along the posterior aspect of the medial femoral condyle. 3. Tricompartmental osteoarthritis. Electronically Signed   By: Ashley Royalty M.D.   On: 02/28/2018 03:34    Procedures Procedures (including critical care time)  EMERGENCY DEPARTMENT US SOFT TISSUE INTERPRETATION "Study: Limited Soft Tissue Ultrasound"  INDICATIONS: Pain Multiple views of the body part were obtained in real-time with a multi-frequency linear probe  PERFORMED BY: Myself IMAGES ARCHIVED?: Yes SIDE:Right  BODY PART:knee INTERPRETATION:  no discrete fluid collection; some cobblestoning     Medications Ordered in ED Medications  HYDROcodone-acetaminophen (NORCO/VICODIN) 5-325 MG per tablet 1 tablet (1 tablet Oral Given 02/28/18 0235)     Initial Impression / Assessment and Plan / ED Course  I have reviewed the triage vital signs and the nursing notes.  Pertinent labs & imaging results that were available during my care of the patient were reviewed by me and considered in my medical decision making (see chart for details).     Patient presents with acute onset right knee pain and swelling.  Denies trauma.  Denies fevers or infectious symptoms.  No history of gout.  Exam is notable for large knee effusion and decreased range of motion secondary to pain.  He is neurovascular intact.  No overlying skin changes to suggest infection.  Patient was given pain medication and ice was applied.  X-rays concerning for possible fracture.  This does not fit clinically; however, will obtain CT scan.  CT scan has multiple findings including a large suprapatellar effusion which may represent hemarthrosis.  There is also some skin thickening suggestive of cellulitis.  He has no overlying skin changes that suggest cellulitis clinically.  Regarding  possible hemarthrosis, he does not take any blood thinners, no known coagulopathies or bleeding disorders.  I have reviewed his chart.  Last platelets were 88.  I would not expect spontaneous bleeding at that level.  Repeat platelets checked.  They are now 53.  Again would not expect spontaneous bleeding at this level.  Given concern for hemarthrosis, arthrocentesis was deferred.  I have low suspicion at this time for septic arthritis.  Recommend knee immobilizer, crutches, ice, elevation, pain medications.  Close orthopedic follow-up.  Final Clinical Impressions(s) /  ED Diagnoses   Final diagnoses:  Effusion, right knee  Hemarthrosis    ED Discharge Orders        Ordered    HYDROcodone-acetaminophen (NORCO/VICODIN) 5-325 MG tablet  Every 6 hours PRN     02/28/18 0701       Merryl Hacker, MD 02/28/18 [phone]

## 2018-02-28 NOTE — ED Notes (Signed)
ED Provider at bedside. 

## 2018-02-28 NOTE — ED Notes (Signed)
Patient transported to X-ray 

## 2018-03-02 DIAGNOSIS — M1711 Unilateral primary osteoarthritis, right knee: Secondary | ICD-10-CM | POA: Diagnosis not present

## 2018-03-02 DIAGNOSIS — M2500 Hemarthrosis, unspecified joint: Secondary | ICD-10-CM | POA: Diagnosis not present

## 2018-03-02 DIAGNOSIS — E119 Type 2 diabetes mellitus without complications: Secondary | ICD-10-CM | POA: Diagnosis not present

## 2018-03-02 DIAGNOSIS — G4733 Obstructive sleep apnea (adult) (pediatric): Secondary | ICD-10-CM | POA: Diagnosis not present

## 2018-03-02 DIAGNOSIS — D696 Thrombocytopenia, unspecified: Secondary | ICD-10-CM | POA: Diagnosis not present

## 2018-03-09 DIAGNOSIS — M1711 Unilateral primary osteoarthritis, right knee: Secondary | ICD-10-CM | POA: Diagnosis not present

## 2018-03-17 DIAGNOSIS — D696 Thrombocytopenia, unspecified: Secondary | ICD-10-CM | POA: Diagnosis not present

## 2018-03-30 DIAGNOSIS — M1711 Unilateral primary osteoarthritis, right knee: Secondary | ICD-10-CM | POA: Diagnosis not present

## 2018-04-02 DIAGNOSIS — G4733 Obstructive sleep apnea (adult) (pediatric): Secondary | ICD-10-CM | POA: Diagnosis not present

## 2018-04-12 DIAGNOSIS — K219 Gastro-esophageal reflux disease without esophagitis: Secondary | ICD-10-CM | POA: Diagnosis not present

## 2018-04-12 DIAGNOSIS — D696 Thrombocytopenia, unspecified: Secondary | ICD-10-CM | POA: Diagnosis not present

## 2018-04-12 DIAGNOSIS — E119 Type 2 diabetes mellitus without complications: Secondary | ICD-10-CM | POA: Diagnosis not present

## 2018-04-12 DIAGNOSIS — E782 Mixed hyperlipidemia: Secondary | ICD-10-CM | POA: Diagnosis not present

## 2018-05-02 DIAGNOSIS — G4733 Obstructive sleep apnea (adult) (pediatric): Secondary | ICD-10-CM | POA: Diagnosis not present

## 2018-05-10 ENCOUNTER — Other Ambulatory Visit: Payer: Self-pay | Admitting: Gastroenterology

## 2018-05-10 DIAGNOSIS — K7469 Other cirrhosis of liver: Secondary | ICD-10-CM

## 2018-05-18 ENCOUNTER — Ambulatory Visit
Admission: RE | Admit: 2018-05-18 | Discharge: 2018-05-18 | Disposition: A | Discharge status: Home / Self Care | Payer: BLUE CROSS/BLUE SHIELD | Source: Ambulatory Visit | Attending: Gastroenterology | Admitting: Gastroenterology

## 2018-05-18 DIAGNOSIS — K7469 Other cirrhosis of liver: Secondary | ICD-10-CM

## 2018-05-18 DIAGNOSIS — K8020 Calculus of gallbladder without cholecystitis without obstruction: Secondary | ICD-10-CM | POA: Diagnosis not present

## 2018-05-18 DIAGNOSIS — K7460 Unspecified cirrhosis of liver: Secondary | ICD-10-CM | POA: Diagnosis not present

## 2018-06-02 DIAGNOSIS — G4733 Obstructive sleep apnea (adult) (pediatric): Secondary | ICD-10-CM | POA: Diagnosis not present

## 2018-07-03 DIAGNOSIS — G4733 Obstructive sleep apnea (adult) (pediatric): Secondary | ICD-10-CM | POA: Diagnosis not present

## 2018-07-16 IMAGING — US US ABDOMEN COMPLETE
1 series · 13 of 25 positions shown · non-contrast
Comparison: Abdominal ultrasound April 27, 2017

CLINICAL DATA: Hepatic cirrhosis, screening for hepatocellular
carcinoma.

EXAM:
ABDOMEN ULTRASOUND COMPLETE

[Series 1: us abdomen complete · 0.15mm/px · 13 of 106 slices shown]
[im 1/106]
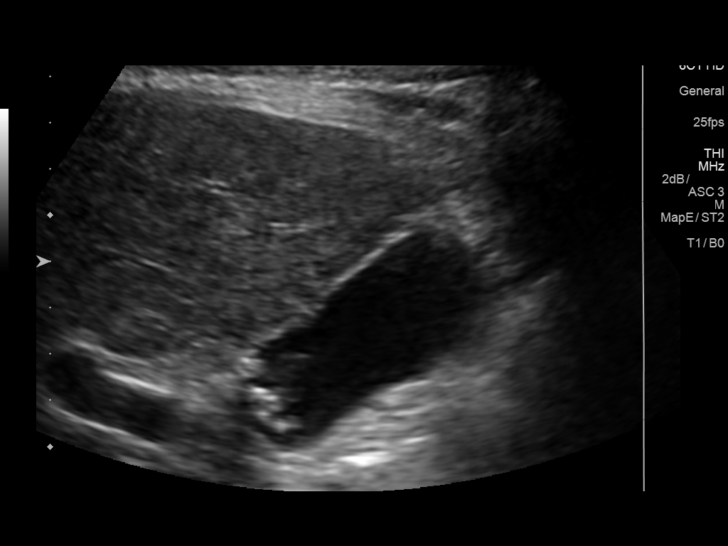
[im 9/106]
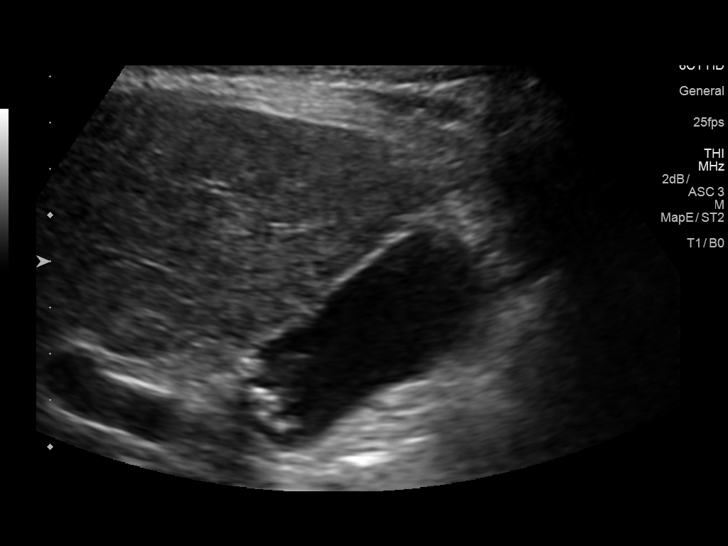
[im 18/106]
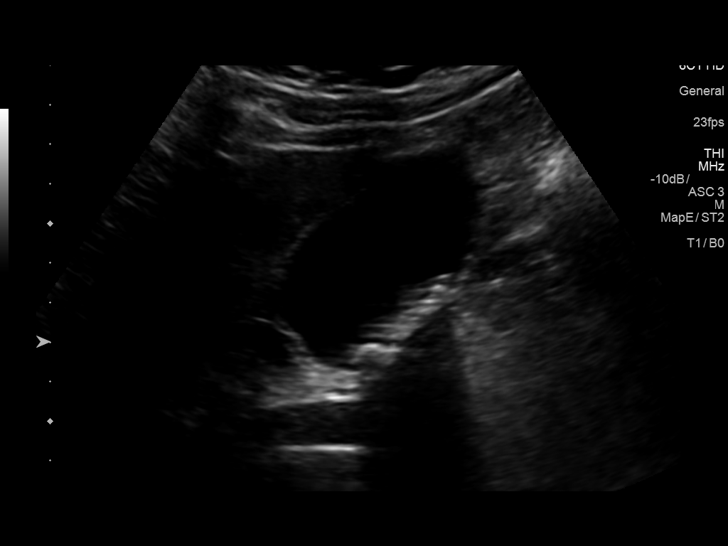
[im 27/106]
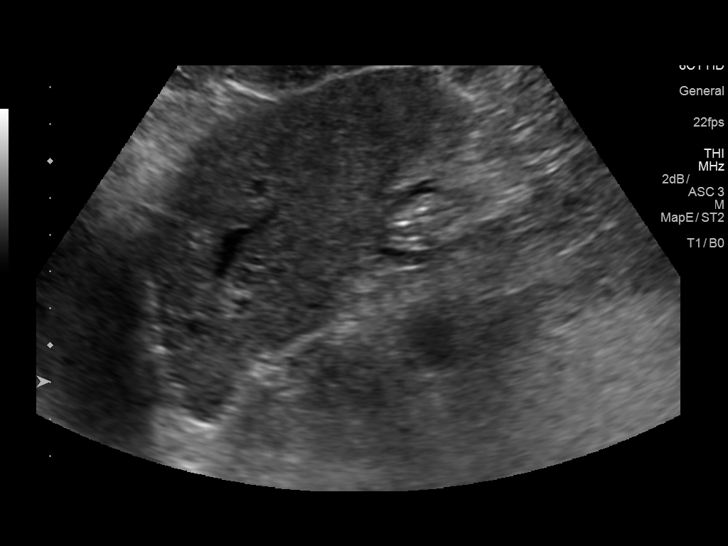
[im 36/106]
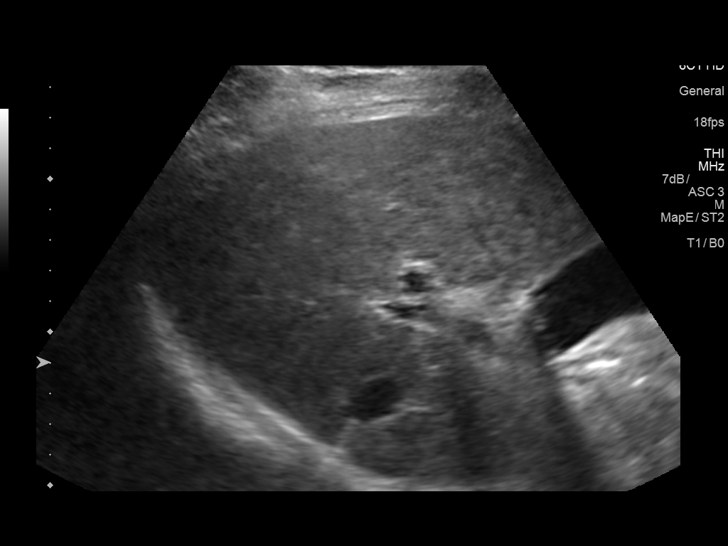
[im 44/106]
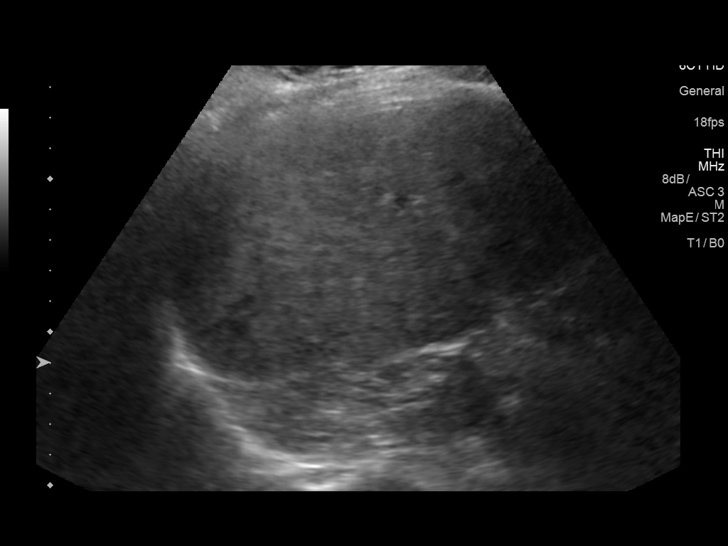
[im 53/106]
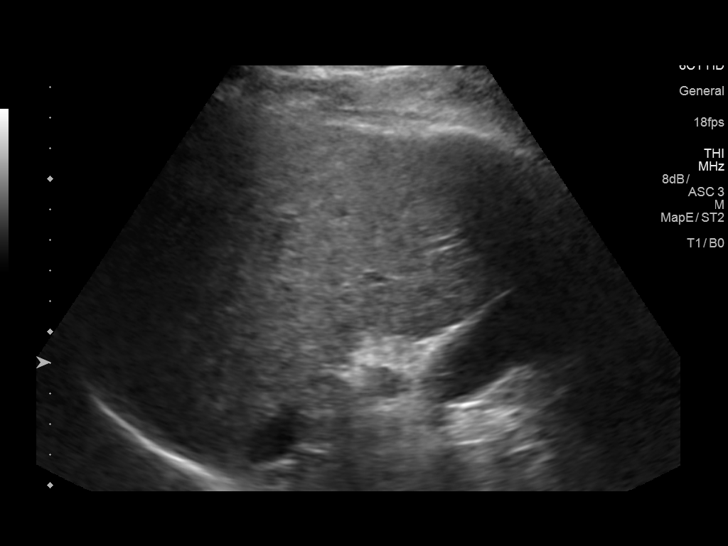
[im 62/106]
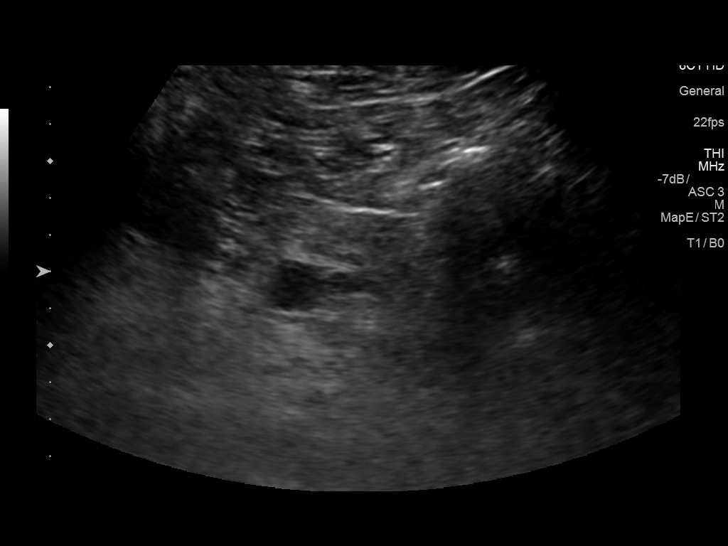
[im 71/106]
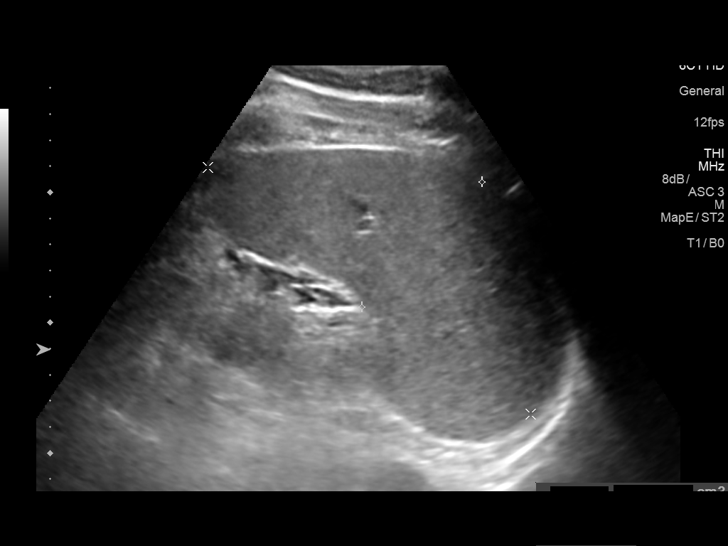
[im 79/106]
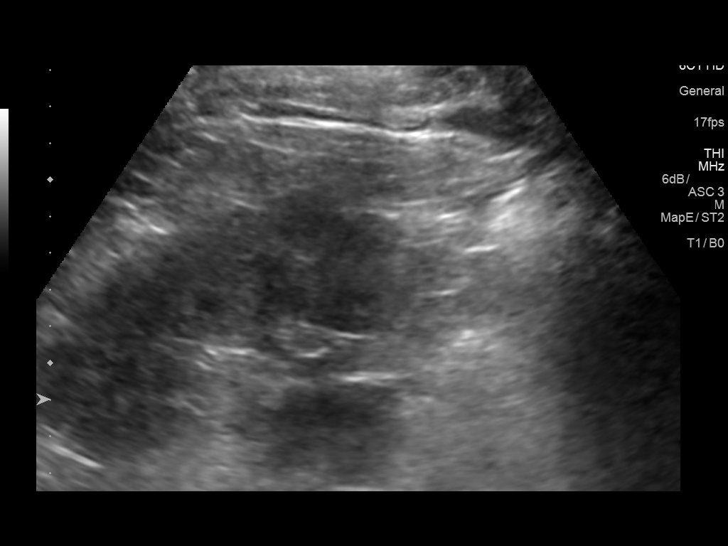
[im 88/106]
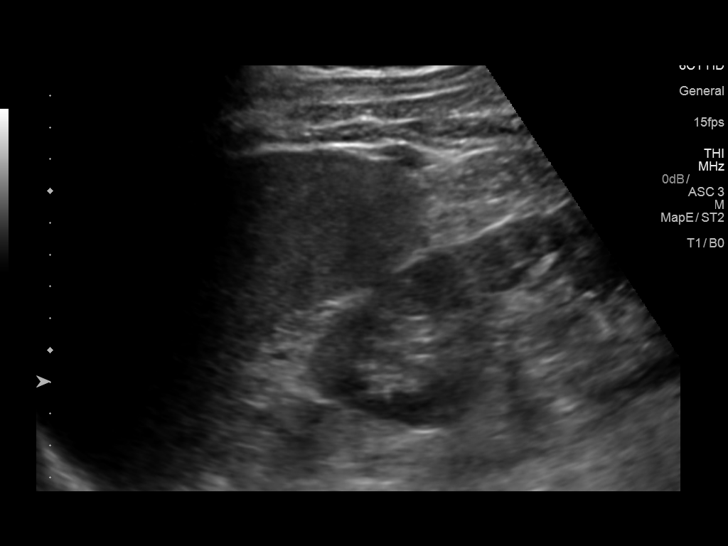
[im 97/106]
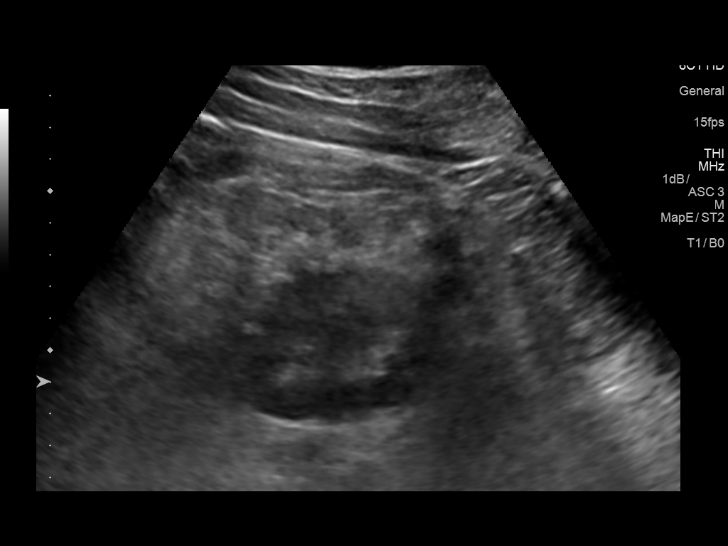
[im 106/106]
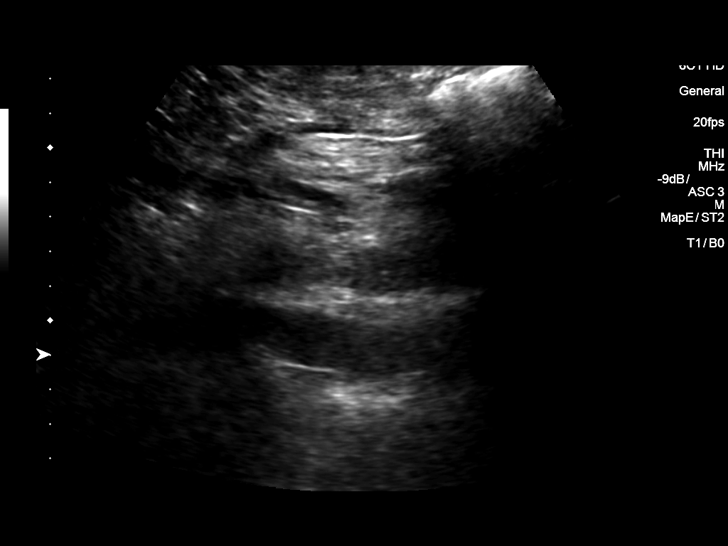

[13 of 25 positions shown; findings below may reference images not displayed]

FINDINGS: Gallbladder: The gallbladder is adequately distended. There are
multiple echogenic mobile shadowing stones measuring up to 1 cm in
diameter. There is no gallbladder wall thickening, pericholecystic
fluid, or positive sonographic Murphy's sign.

Common bile duct: Diameter: 4.3 mm

Liver: The hip panic echotexture is increased and heterogeneous. The
surface contour of the liver is mildly irregular. There is no
discrete mass or ductal dilation. Portal vein is patent on color
Doppler imaging with normal direction of blood flow towards the
liver.

IVC: No abnormality visualized.

Pancreas: Visualized portion unremarkable.

Spleen: The spleen measures 15.9 x 15.6 x 6.7 cm with calculated
volume of 0690 cc

Right Kidney: Length: 13.9 cm. Echogenicity within normal limits. No
mass or hydronephrosis visualized.

Left Kidney: Length: 13.9 cm. Echogenicity within normal limits. No
mass or hydronephrosis visualized.

Abdominal aorta: No aneurysm visualized.

Other findings: None.
IMPRESSION: Chronic cirrhotic changes within the liver.  No suspicious masses.

Gallstones without sonographic evidence of acute cholecystitis.

Splenomegaly.  No ascites is observed.

## 2018-08-02 DIAGNOSIS — G4733 Obstructive sleep apnea (adult) (pediatric): Secondary | ICD-10-CM | POA: Diagnosis not present

## 2018-08-09 DIAGNOSIS — N401 Enlarged prostate with lower urinary tract symptoms: Secondary | ICD-10-CM | POA: Diagnosis not present

## 2018-08-09 DIAGNOSIS — R350 Frequency of micturition: Secondary | ICD-10-CM | POA: Diagnosis not present

## 2018-08-17 ENCOUNTER — Encounter: Payer: Self-pay | Admitting: Nurse Practitioner

## 2018-08-17 NOTE — Progress Notes (Signed)
[facility]  PATIENT: Mike Dunn DOB: 22-Jun-1956   REASON FOR VISIT: follow up for OSA CPAP compliance HISTORY FROM: Patient     HISTORY OF PRESENT ILLNESS: Mike Dunn is a 62 year old right-handed gentleman with an underlying medical history of prostatitis, history of UTI, BPH, hyperlipidemia, non alcoholic liver cirrhosis and esophageal varices, and obesity, who presents for follow-up consultation of his obstructive sleep apnea, after sleep study testing and starting CPAP therapy. The patient is unaccompanied today. I first met him on 09/03/2007. At the request of his urologist, at which time he reported snoring and daytime somnolence as well as significant nocturia. I suggested we proceed with a sleep study for concern for underlying OSA. He had a baseline sleep study, followed by a CPAP titration study. I went over his test results with him in detail today. Baseline sleep study from 10/15/2017 showed a sleep latency of 12 minutes, REM latency markedly delayed at 257 minutes and sleep efficiency was 82.2%. He had an increased percentage of stage II sleep, slow-wave sleep was adequate and REM sleep was reduced at 9.5%. Total AHI was in the mild range at 13.0 per hour, REM AHI in the moderate range at 21.3 per hour and supine AHI was also in the near severe range at 29.4 per hour, average oxygen saturation was 94%, nadir was 85%. He had no significant PLMS. Based on his sleep related complaints and sleep study results I suggested we proceed with CPAP treatment. He had a CPAP titration study on 11/11/2017. Sleep efficiency was only 46.1%, sleep latency delayed at 67.5 minutes, REM latency also delayed at 231 minutes. He had an increased percentage of slow-wave sleep and a markedly decreased percentage of REM sleep at 6.5%. He was fitted with a medium fullface mask and CPAP was titrated from 5 cm to 8 cm. On the final pressure his AHI was 0 per hour with nonsupine REM sleep  achieved an O2 nadir of 89%. He had no significant PLMS. Based on his test results I prescribed CPAP therapy for home use at a pressure of 8 cm via full facemask.  Today, 02/16/2018: I reviewed his CPAP compliance data from 01/16/2018 through 02/14/2018 which is a total of 30 days, during which time he used his CPAP every night with percent used days greater than 4 hours at 97%, indicating excellent compliance with an average usage of 7 hours and 17 minutes, residual AHI at goal at 0.5 per hour, leak acceptable with the 95th percentile at 10.4 L/m on a pressure of 8 cm with EPR of 3. He reports feeling better including better sleep consolidation sleep quality, nocturia is much better. He is quite pleased with his outcome and surprised that he was able to adapt to CPAP therapy as quickly as he did. He is motivated and willing to continue. Unfortunately, he had developed cellulitis about a week ago, had to be in the emergency room, that is the only day during which his CPAP usage was less than 4 hours which is understandable as he spent some of the night in the ER. He was treated with IV Rocephin and is finishing up on his oral Keflex. He is feeling better in that regard as well. He still has residual swelling. UPDATE 10/24/2019[name], 62 year old male returns for follow-up with history of obstructive sleep apnea with CPAP.  He is doing well with his machine.  He claims that he wakes up feeling rested.  CPAP data dated 07/19/2018-08/17/2018 shows compliance  greater than 4 hours at 100%.  Average usage 7 hours 22 minutes.  Set pressure 8 cm.  EPR level 3 AHI 0.4 he returns for reevaluation  REVIEW OF SYSTEMS: Full 14 system review of systems performed and notable only for those listed, all others are neg:  Constitutional: neg  Cardiovascular: neg Ear/Nose/Throat: Hearing loss Skin: neg Eyes: neg Respiratory: neg Gastroitestinal: neg  Hematology/Lymphatic: neg  Endocrine: neg Musculoskeletal:  Walking difficulty Allergy/Immunology: neg Neurological: neg Psychiatric: neg Sleep : Obstructive sleep apnea with CPAP   ALLERGIES: Allergies  Allergen Reactions  . Tamsulosin   . Zocor [Simvastatin] Other (See Comments)    Caused numbness    HOME MEDICATIONS: Outpatient Medications Prior to Visit  Medication Sig Dispense Refill  . alfuzosin (UROXATRAL) 10 MG 24 hr tablet Take 10 mg daily by mouth.  11  . co-enzyme Q-10 50 MG capsule Take 50 mg by mouth daily.      . CORGARD 40 MG tablet Take 40 mg daily by mouth.  4  . fenofibrate 160 MG tablet Take 160 mg daily by mouth.    . ferrous sulfate 325 (65 FE) MG tablet Take 325 mg daily with breakfast by mouth.    . fish oil-omega-3 fatty acids 1000 MG capsule Take 1 g by mouth daily.      . Melatonin 10 MG CAPS Take by mouth.    . Multiple Vitamins-Minerals (MULTI FOR HIM 50+ PO) Take 1 tablet by mouth daily.      . pantoprazole (PROTONIX) 40 MG tablet Take 40 mg by mouth daily.    . Probiotic Product (ALIGN) 4 MG CAPS Take 4 mg by mouth daily.    . rosuvastatin (CRESTOR) 10 MG tablet Take 10 mg by mouth daily.      . cephALEXin (KEFLEX) 500 MG capsule Take 1 capsule (500 mg total) by mouth 4 (four) times daily. 28 capsule 0  . HYDROcodone-acetaminophen (NORCO/VICODIN) 5-325 MG tablet Take 1 tablet by mouth every 6 (six) hours as needed. 10 tablet 0   No facility-administered medications prior to visit.     PAST MEDICAL HISTORY: Past Medical History:  Diagnosis Date  . BPH (benign prostatic hyperplasia)   . Cancer ([place])   . Cirrhosis, nonalcoholic ([place])   . Diabetes mellitus without complication ([place])   . Hyperlipidemia   . Obesity   . Prostatitis, chronic   . Spermatocele of epididymis 2014    PAST SURGICAL HISTORY: Past Surgical History:  Procedure Laterality Date  . BASAL CELL CARCINOMA EXCISION    . CARPAL TUNNEL RELEASE    . HERNIA REPAIR     ventral and inguinal   . MOUTH SURGERY    . TONSILLECTOMY       FAMILY HISTORY: Family History  Problem Relation Age of Onset  . Hypertension Mother   . Heart disease Father   . Nephrolithiasis Brother     SOCIAL HISTORY: Social History   Socioeconomic History  . Marital status: Married    Spouse name: Not on file  . Number of children: Not on file  . Years of education: Not on file  . Highest education level: Not on file  Occupational History  . Not on file  Social Needs  . Financial resource strain: Not on file  . Food insecurity:    Worry: Not on file    Inability: Not on file  . Transportation needs:    Medical: Not on file    Non-medical: Not on file  Tobacco  Use  . Smoking status: Never Smoker  . Smokeless tobacco: Never Used  Substance and Sexual Activity  . Alcohol use: No  . Drug use: No  . Sexual activity: Never  Lifestyle  . Physical activity:    Days per week: Not on file    Minutes per session: Not on file  . Stress: Not on file  Relationships  . Social connections:    Talks on phone: Not on file    Gets together: Not on file    Attends religious service: Not on file    Active member of club or organization: Not on file    Attends meetings of clubs or organizations: Not on file    Relationship status: Not on file  . Intimate partner violence:    Fear of current or ex partner: Not on file    Emotionally abused: Not on file    Physically abused: Not on file    Forced sexual activity: Not on file  Other Topics Concern  . Not on file  Social History Narrative  . Not on file         PHYSICAL EXAM  Vitals:   08/18/18 1434  BP: 115/66  Pulse: 68  Weight: 239 lb 6.4 oz (108.6 kg)  Height: _0  (1.753 m)   Body mass index is 35.35 kg/m.  Generalized: Well developed, obese male in no acute distress  Head: normocephalic and atraumatic,. Oropharynx benign  Neck: Supple,  Musculoskeletal: No deformity   Neurological examination   Mentation: Alert oriented to time, place, history taking.  Attention span and concentration appropriate. Recent and remote memory intact.  Follows all commands speech and language fluent.   Cranial nerve II-XII: Pupils were equal round reactive to light extraocular movements were full, visual field were full on confrontational test. Facial sensation and strength were normal. hearing was intact to finger rubbing bilaterally. Uvula tongue midline. head turning and shoulder shrug were normal and symmetric.Tongue protrusion into cheek strength was normal. Motor: normal bulk and tone, full strength in the BUE, BLE, Sensory: normal and symmetric to light touch, Coordination: finger-nose-finger, heel-to-shin bilaterally, no dysmetria Gait and Station: Rising up from seated position without assistance, narrow based    moderate stride, smooth turning, DIAGNOSTIC DATA (LABS, IMAGING, TESTING) - I reviewed patient records, labs, notes, testing and imaging myself where available.  Lab Results  Component Value Date   WBC 5.9 02/28/2018   HGB 14.9 02/28/2018   HCT 42.1 02/28/2018   MCV 96.8 02/28/2018   PLT 70 (L) 02/28/2018      Component Value Date/Time   NA 136 02/10/2018 0100   NA 140 08/01/2015 1208   K 3.6 02/10/2018 0100   K 3.9 08/01/2015 1208   CL 104 02/10/2018 0100   CO2 22 02/10/2018 0100   CO2 23 08/01/2015 1208   GLUCOSE 138 (H) 02/10/2018 0100   GLUCOSE 112 08/01/2015 1208   BUN 15 02/10/2018 0100   BUN 13.0 08/01/2015 1208   CREATININE 0.60 (L) 02/10/2018 0100   CREATININE 0.8 08/01/2015 1208   CALCIUM 8.5 (L) 02/10/2018 0100   CALCIUM 9.8 08/01/2015 1208   PROT 7.6 08/01/2015 1208   ALBUMIN 4.2 08/01/2015 1208   AST 51 (H) 08/01/2015 1208   ALT 35 08/01/2015 1208   ALKPHOS 55 08/01/2015 1208   BILITOT 1.37 (H) 08/01/2015 1208   GFRNONAA >60 02/10/2018 0100   GFRAA >60 02/10/2018 0100   Lab Results  Component Value Date   CHOL 163 05/09/2011  HDL 38 (L) 05/09/2011   LDLCALC 90 05/09/2011   TRIG 173 (H) 05/09/2011    CHOLHDL 4.3 05/09/2011   Lab Results  Component Value Date   HGBA1C 6.2 (H) 05/10/2011    Lab Results  Component Value Date   TSH 1.987 05/09/2011      ASSESSMENT AND PLAN Mike Dunn a very pleasant 32 year oldfemalewith an underlying medical history of prostatitis, history of UTI, BPH, hyperlipidemia, non alcoholic liver cirrhosis and esophageal varices, and obesity, who Presents for follow-up consultation of his obstructive sleep apnea, after sleep study testing. He had a baseline sleep study, followed by a CPAP titration study. He has established treatment with CPAP. Overall, his baseline sleep study showed mild sleep apnea but because of his symptoms he was advised to pursue treatment. He has done quite well with CPAP therapy, feels improved in terms of his nocturia, sleep quality, sleep consolidation and daytime symptoms. Data dated 07/19/2018-08/17/2018 shows compliance greater than 4 hours at 100%.  Average usage 7 hours 22 minutes.  Set pressure 8 cm.  EPR level 3 AHI 0.4     PLAN: CPAP compliance 100% No change in settings Follow-up yearly for compliance checks Dennie Bible, [facility], [facility], APRN  [facility] [address] [place], [place] [address] [phone]

## 2018-08-18 ENCOUNTER — Encounter: Payer: Self-pay | Admitting: Nurse Practitioner

## 2018-08-18 ENCOUNTER — Ambulatory Visit (INDEPENDENT_AMBULATORY_CARE_PROVIDER_SITE_OTHER): Payer: BLUE CROSS/BLUE SHIELD | Admitting: Nurse Practitioner

## 2018-08-18 DIAGNOSIS — G4733 Obstructive sleep apnea (adult) (pediatric): Secondary | ICD-10-CM | POA: Diagnosis not present

## 2018-08-18 DIAGNOSIS — Z9989 Dependence on other enabling machines and devices: Secondary | ICD-10-CM

## 2018-08-18 NOTE — Patient Instructions (Signed)
CPAP compliance 100% No change in settings Follow-up yearly for compliance checks

## 2018-08-22 DIAGNOSIS — N401 Enlarged prostate with lower urinary tract symptoms: Secondary | ICD-10-CM | POA: Diagnosis not present

## 2018-08-22 DIAGNOSIS — R350 Frequency of micturition: Secondary | ICD-10-CM | POA: Diagnosis not present

## 2018-08-22 DIAGNOSIS — N411 Chronic prostatitis: Secondary | ICD-10-CM | POA: Diagnosis not present

## 2018-09-02 DIAGNOSIS — G4733 Obstructive sleep apnea (adult) (pediatric): Secondary | ICD-10-CM | POA: Diagnosis not present

## 2018-09-07 DIAGNOSIS — L570 Actinic keratosis: Secondary | ICD-10-CM | POA: Diagnosis not present

## 2018-09-07 DIAGNOSIS — Z85828 Personal history of other malignant neoplasm of skin: Secondary | ICD-10-CM | POA: Diagnosis not present

## 2018-09-07 DIAGNOSIS — C44519 Basal cell carcinoma of skin of other part of trunk: Secondary | ICD-10-CM | POA: Diagnosis not present

## 2018-09-07 DIAGNOSIS — L821 Other seborrheic keratosis: Secondary | ICD-10-CM | POA: Diagnosis not present

## 2018-10-02 DIAGNOSIS — G4733 Obstructive sleep apnea (adult) (pediatric): Secondary | ICD-10-CM | POA: Diagnosis not present

## 2018-10-06 DIAGNOSIS — Z23 Encounter for immunization: Secondary | ICD-10-CM | POA: Diagnosis not present

## 2018-10-06 DIAGNOSIS — E782 Mixed hyperlipidemia: Secondary | ICD-10-CM | POA: Diagnosis not present

## 2018-10-06 DIAGNOSIS — Z0000 Encounter for general adult medical examination without abnormal findings: Secondary | ICD-10-CM | POA: Diagnosis not present

## 2018-10-06 DIAGNOSIS — E119 Type 2 diabetes mellitus without complications: Secondary | ICD-10-CM | POA: Diagnosis not present

## 2018-10-06 DIAGNOSIS — K219 Gastro-esophageal reflux disease without esophagitis: Secondary | ICD-10-CM | POA: Diagnosis not present

## 2018-10-10 IMAGING — US US EXTREM LOW VENOUS*R*
1 series · 13 of 24 positions shown · non-contrast
Comparison: None.

CLINICAL DATA: RIGHT leg swelling for 4 days after driving for 3
hours.



[Series 1: us extrem low venous*right* · 0.09mm/px · 13 of 29 slices shown]
[im 1/29]
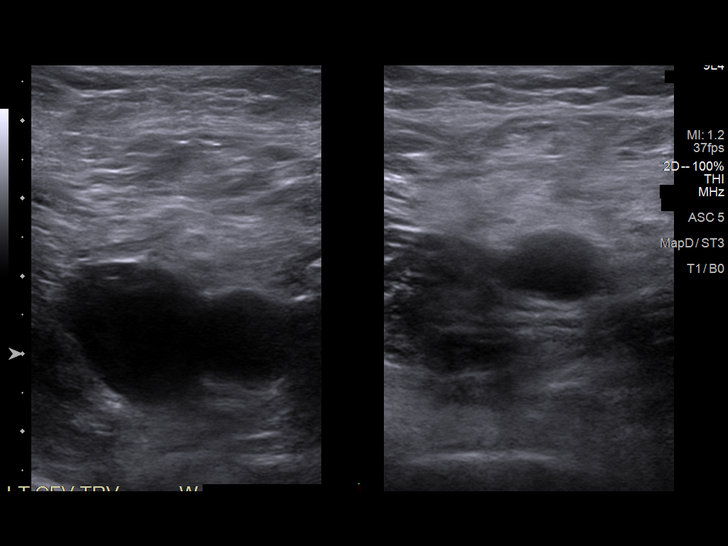
[im 3/29]
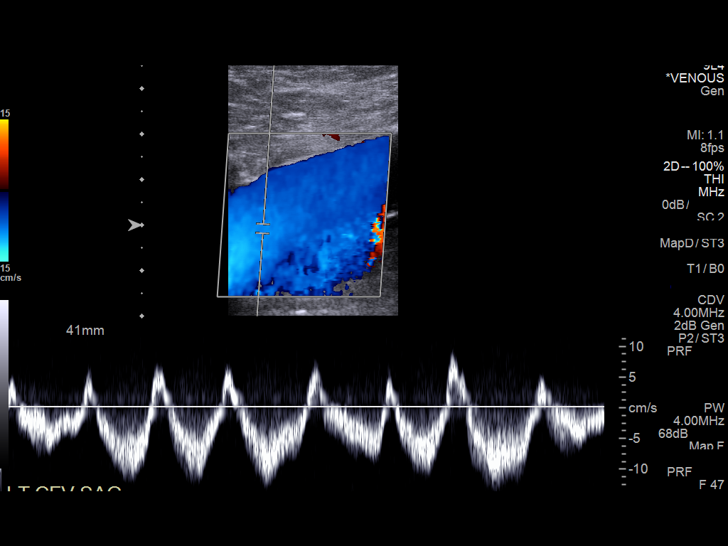
[im 5/29]
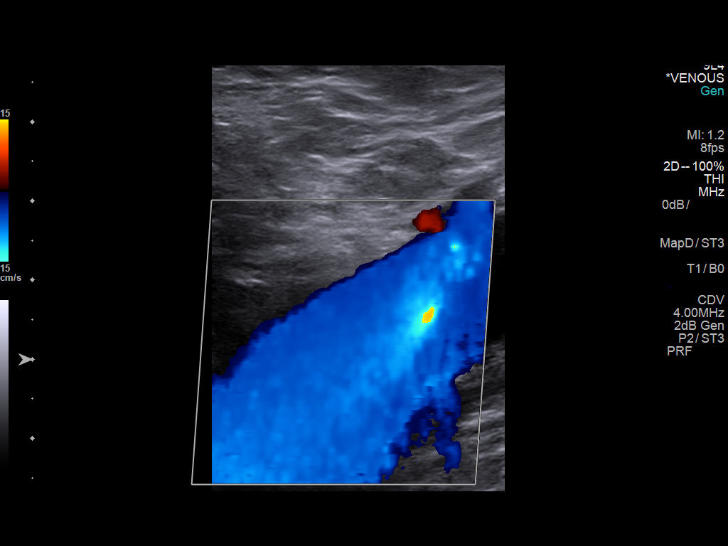
[im 8/29]
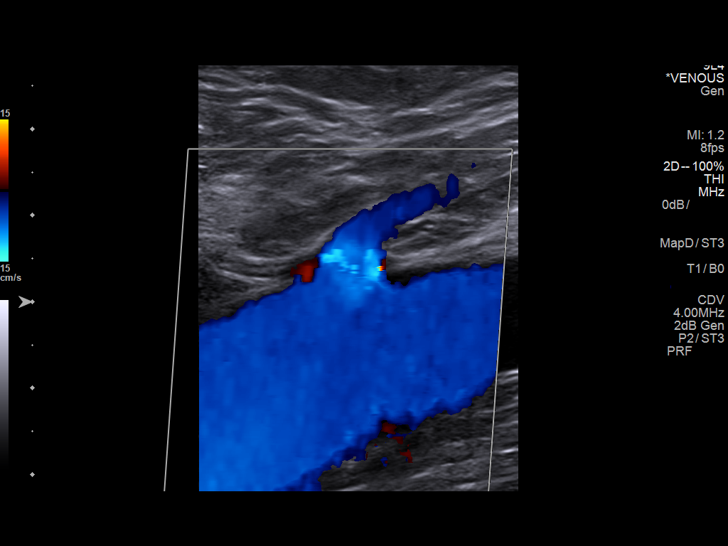
[im 10/29]
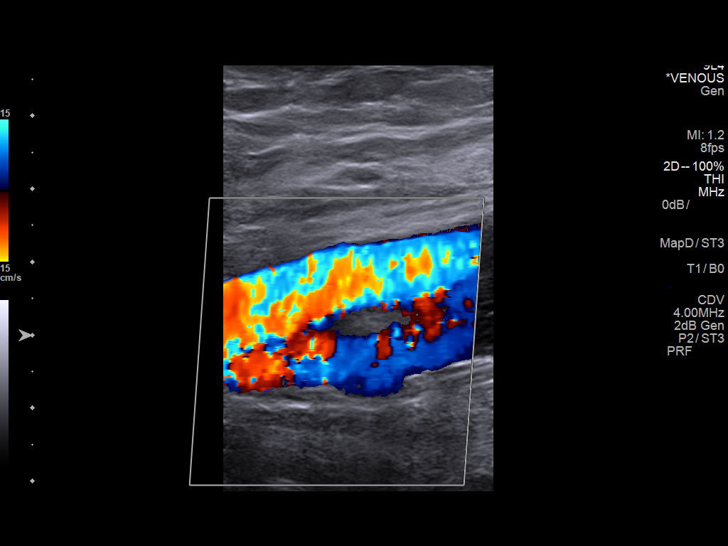
[im 13/29]
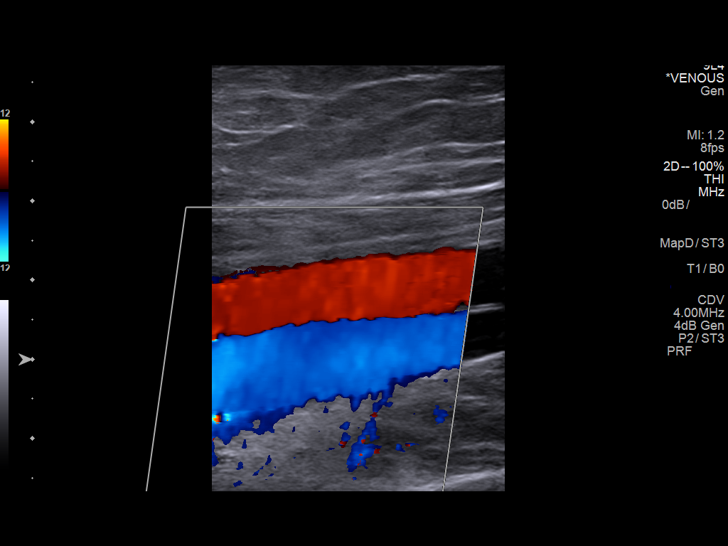
[im 15/29]
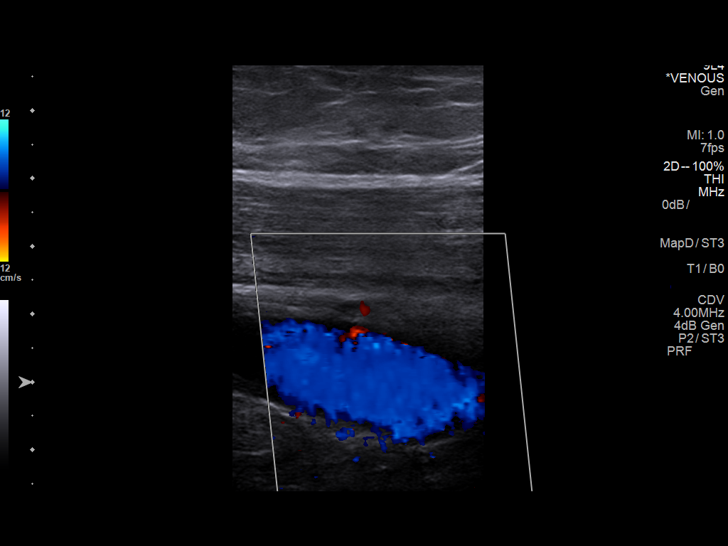
[im 16/29]
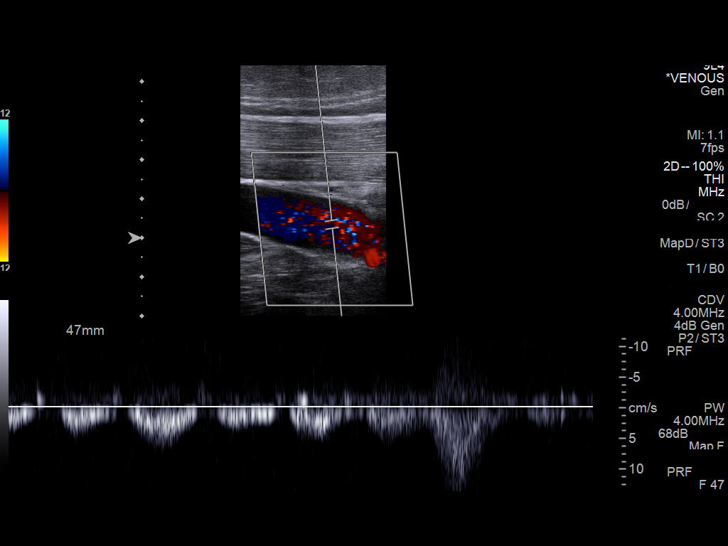
[im 19/29]
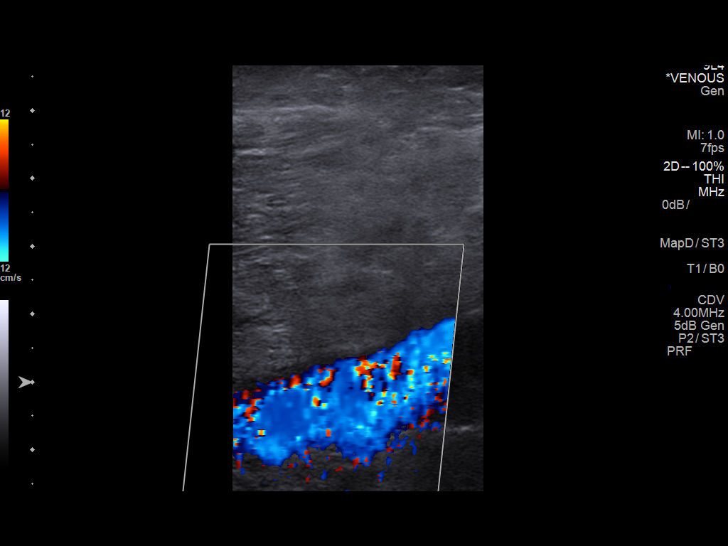
[im 21/29]
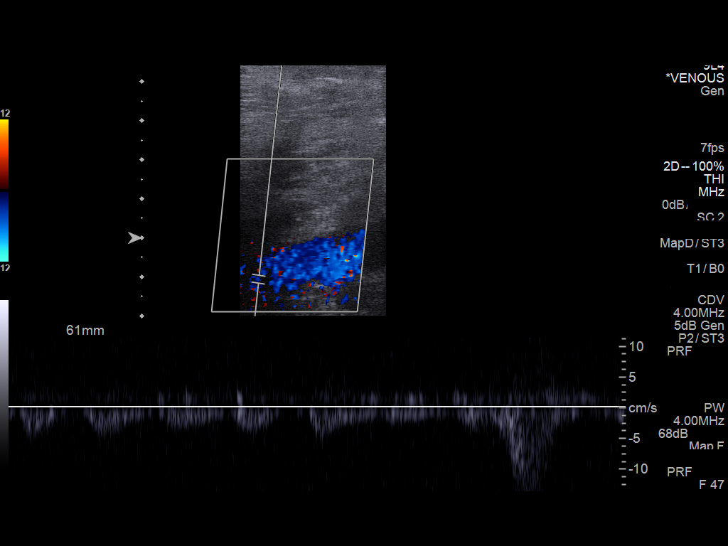
[im 24/29]
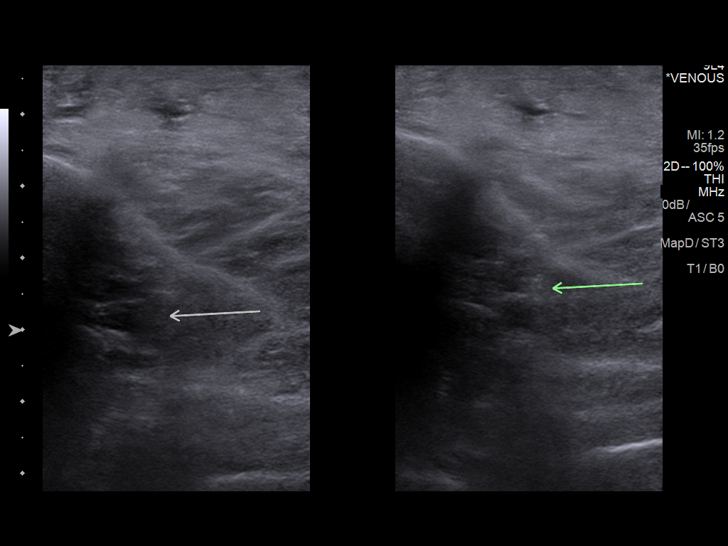
[im 26/29]
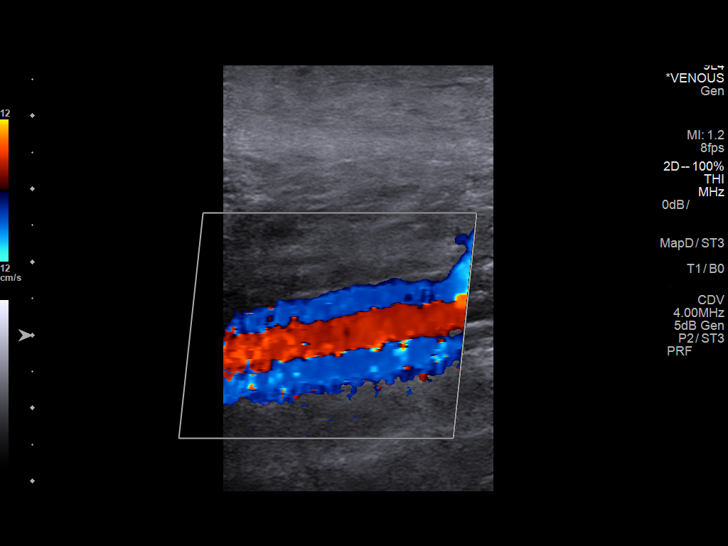
[im 29/29]
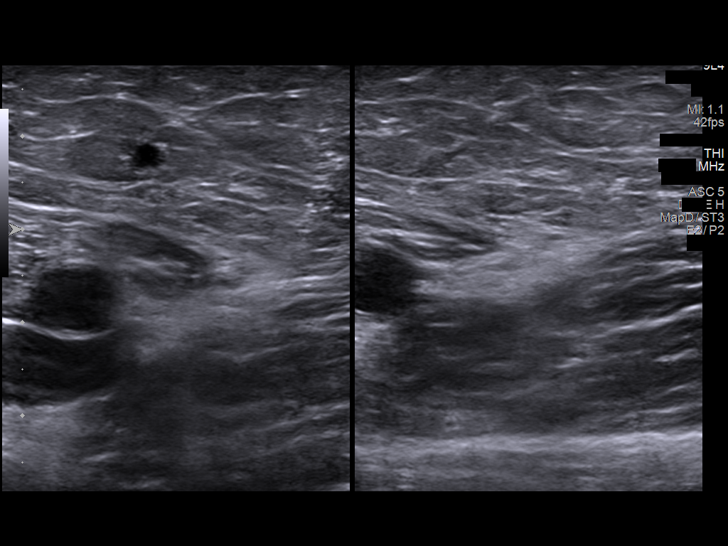

[13 of 24 positions shown; findings below may reference images not displayed]

FINDINGS: Contralateral Common Femoral Vein: Respiratory phasicity is normal
and symmetric with the symptomatic side. No evidence of thrombus.
Normal compressibility.

Common Femoral Vein: No evidence of thrombus. Normal
compressibility, respiratory phasicity and response to augmentation.

Saphenofemoral Junction: No evidence of thrombus. Normal
compressibility and flow on color Doppler imaging.

Profunda Femoral Vein: No evidence of thrombus. Normal
compressibility and flow on color Doppler imaging.

Femoral Vein: No evidence of thrombus. Normal compressibility,
respiratory phasicity and response to augmentation.

Popliteal Vein: No evidence of thrombus. Normal compressibility,
respiratory phasicity and response to augmentation.

Calf Veins: No evidence of thrombus. Normal compressibility and flow
on color Doppler imaging.

Superficial Great Saphenous Vein: No evidence of thrombus. Normal
compressibility.

Venous Reflux:  None.

Other Findings:  Interstitial edema of the lower extremity.
IMPRESSION: No evidence of RIGHT deep venous thrombosis.

## 2018-11-17 DIAGNOSIS — I10 Essential (primary) hypertension: Secondary | ICD-10-CM | POA: Diagnosis not present

## 2018-11-22 DIAGNOSIS — H903 Sensorineural hearing loss, bilateral: Secondary | ICD-10-CM | POA: Diagnosis not present

## 2018-11-23 ENCOUNTER — Other Ambulatory Visit: Payer: Self-pay | Admitting: Gastroenterology

## 2018-11-23 DIAGNOSIS — K7460 Unspecified cirrhosis of liver: Secondary | ICD-10-CM | POA: Diagnosis not present

## 2018-11-29 ENCOUNTER — Ambulatory Visit
Admission: RE | Admit: 2018-11-29 | Discharge: 2018-11-29 | Disposition: A | Discharge status: Home / Self Care | Payer: BLUE CROSS/BLUE SHIELD | Source: Ambulatory Visit | Attending: Gastroenterology | Admitting: Gastroenterology

## 2018-11-29 DIAGNOSIS — K8020 Calculus of gallbladder without cholecystitis without obstruction: Secondary | ICD-10-CM | POA: Diagnosis not present

## 2018-11-29 DIAGNOSIS — K7460 Unspecified cirrhosis of liver: Secondary | ICD-10-CM

## 2018-12-08 DIAGNOSIS — Z23 Encounter for immunization: Secondary | ICD-10-CM | POA: Diagnosis not present

## 2019-01-06 DIAGNOSIS — G4733 Obstructive sleep apnea (adult) (pediatric): Secondary | ICD-10-CM | POA: Diagnosis not present

## 2019-01-16 IMAGING — US US ABDOMEN COMPLETE
1 series · 14 of 25 positions shown · non-contrast
Comparison: Ultrasound 11/15/2017

CLINICAL DATA: Cirrhosis

EXAM:
ABDOMEN ULTRASOUND COMPLETE

[Series 1: us abdomen complete · 0.18mm/px · 14 of 110 slices shown]
[im 1/110]
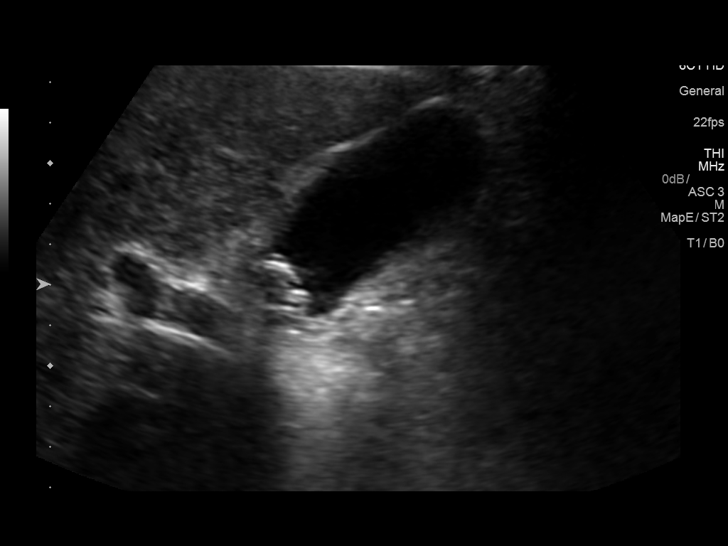
[im 10/110]
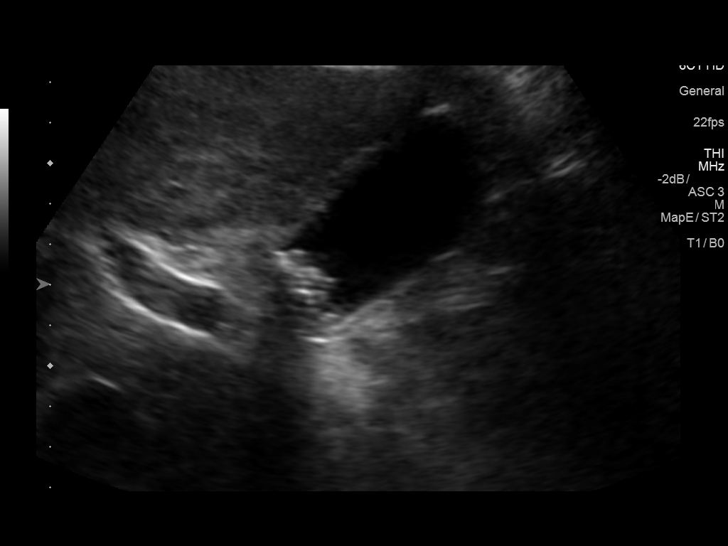
[im 19/110]
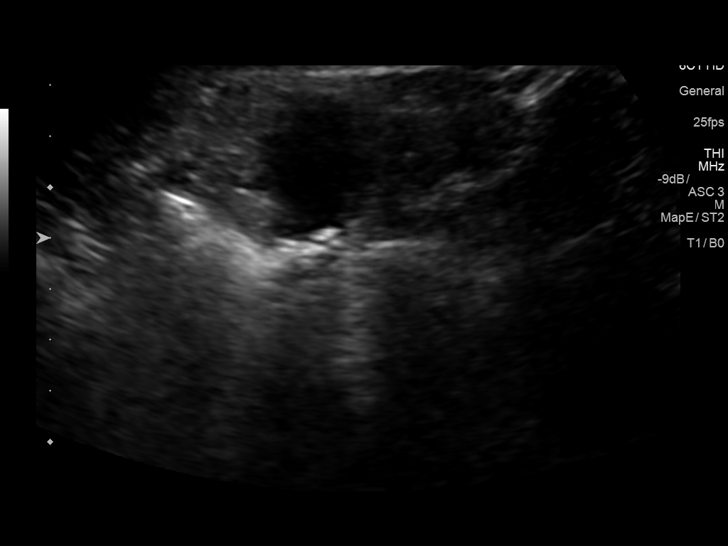
[im 28/110]
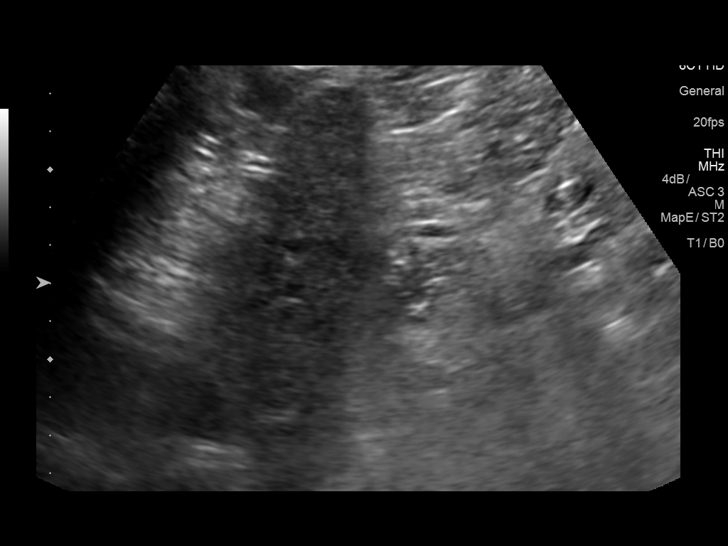
[im 37/110]
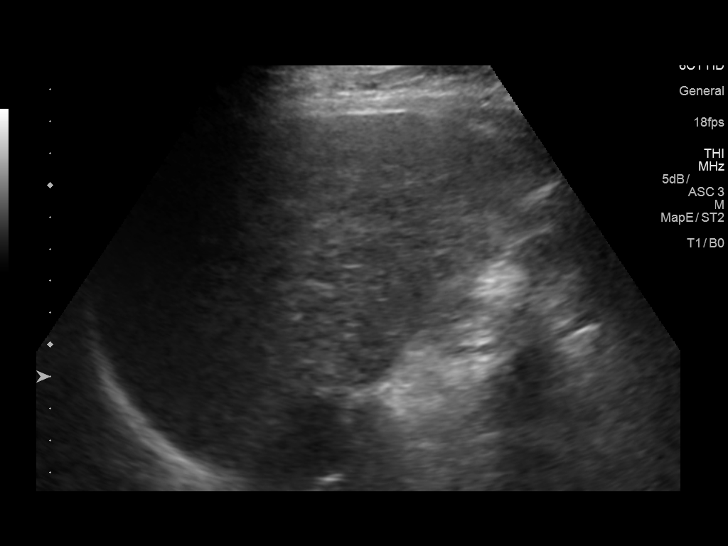
[im 41/110]
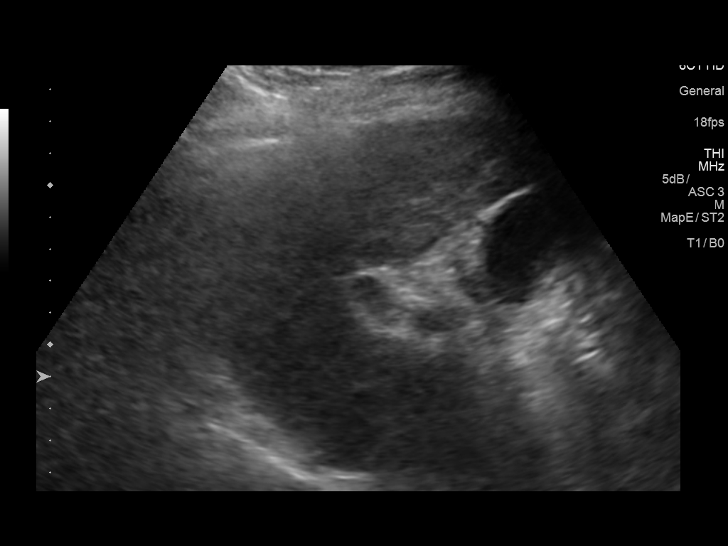
[im 50/110]
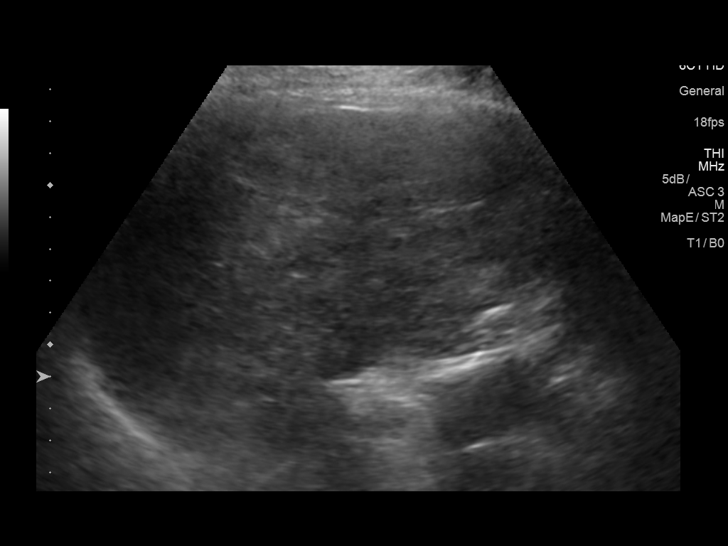
[im 60/110]
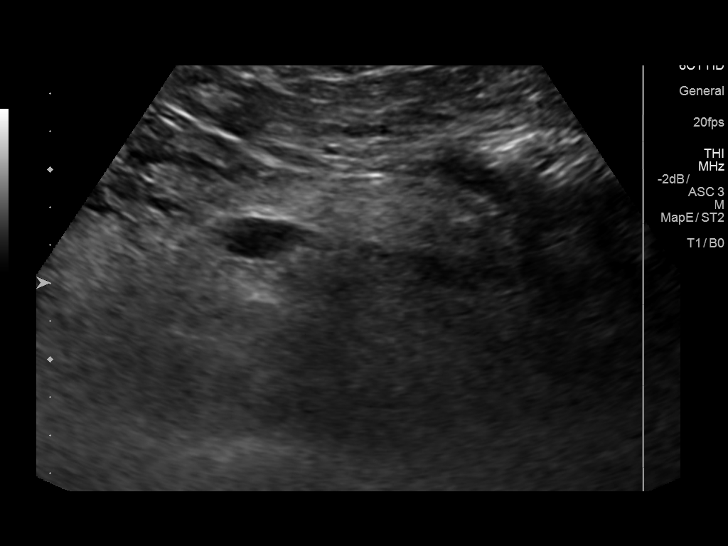
[im 69/110]
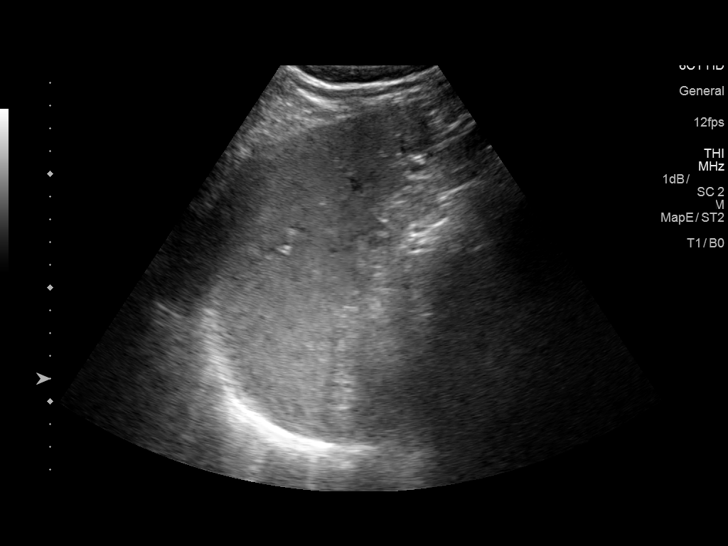
[im 73/110]
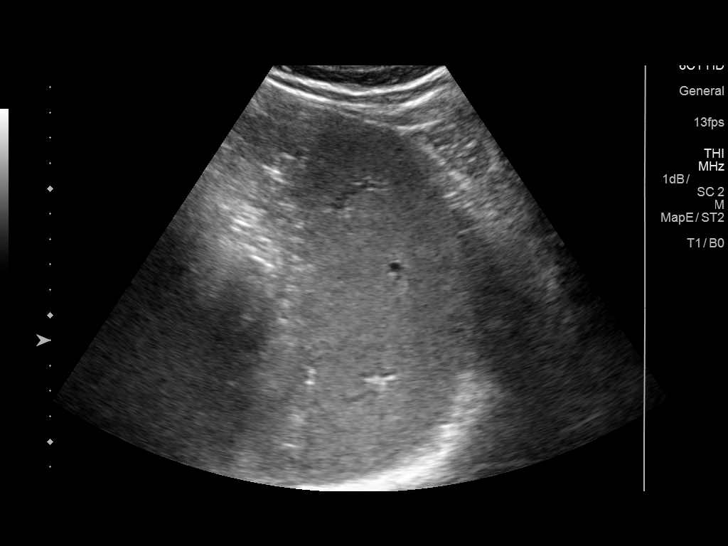
[im 82/110]
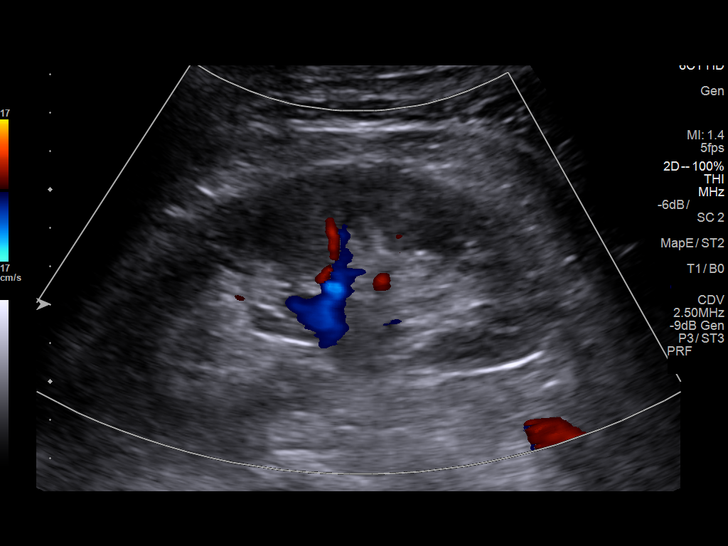
[im 91/110]
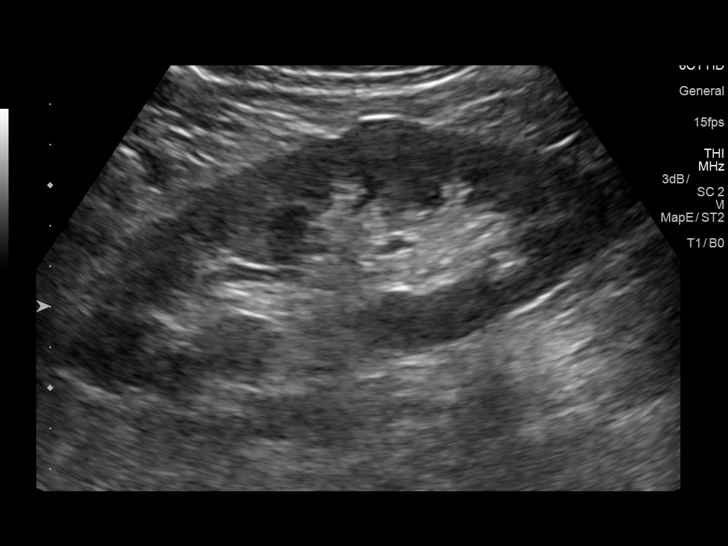
[im 100/110]
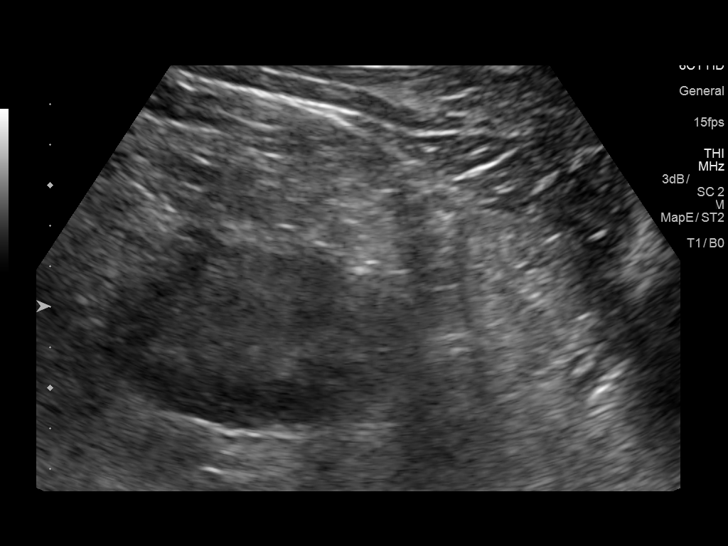
[im 110/110]
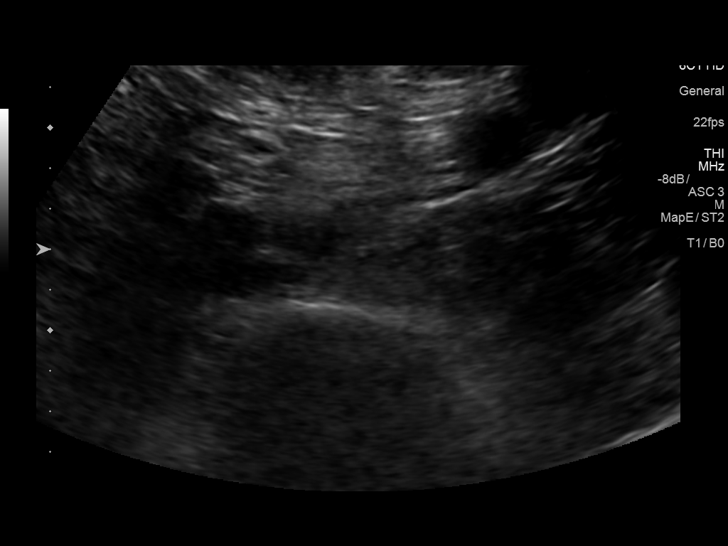

[14 of 25 positions shown; findings below may reference images not displayed]

FINDINGS: Gallbladder: Multiple mobile gallstones, the largest 1.3 cm. No wall
thickening. Negative sonographic Murphy's sign.

Common bile duct: Diameter: Normal caliber, 4 mm

Liver: Heterogeneous echotexture throughout the liver with subtle
nodular contours compatible with cirrhosis. No focal hepatic
abnormality or biliary ductal dilatation. Portal vein is patent on
color Doppler imaging with normal direction of blood flow towards
the liver.

IVC: No abnormality visualized.

Pancreas: Visualized portion unremarkable.

Spleen: Size and appearance within normal limits.

Right Kidney: Length: 12.9 cm. Echogenicity within normal limits. No
mass or hydronephrosis visualized.

Left Kidney: Length: 14.1 cm. Echogenicity within normal limits. No
mass or hydronephrosis visualized.

Abdominal aorta: No aneurysm visualized.

Other findings: None.
IMPRESSION: Stable changes of cirrhosis.  No focal hepatic abnormality.

Cholelithiasis.  No sonographic evidence of acute cholecystitis.

## 2019-04-11 DIAGNOSIS — G4733 Obstructive sleep apnea (adult) (pediatric): Secondary | ICD-10-CM | POA: Diagnosis not present

## 2019-04-11 DIAGNOSIS — E119 Type 2 diabetes mellitus without complications: Secondary | ICD-10-CM | POA: Diagnosis not present

## 2019-04-11 DIAGNOSIS — I10 Essential (primary) hypertension: Secondary | ICD-10-CM | POA: Diagnosis not present

## 2019-04-11 DIAGNOSIS — E782 Mixed hyperlipidemia: Secondary | ICD-10-CM | POA: Diagnosis not present

## 2019-04-11 DIAGNOSIS — K219 Gastro-esophageal reflux disease without esophagitis: Secondary | ICD-10-CM | POA: Diagnosis not present

## 2019-05-01 DIAGNOSIS — L570 Actinic keratosis: Secondary | ICD-10-CM | POA: Diagnosis not present

## 2019-05-01 DIAGNOSIS — L821 Other seborrheic keratosis: Secondary | ICD-10-CM | POA: Diagnosis not present

## 2019-05-01 DIAGNOSIS — Z08 Encounter for follow-up examination after completed treatment for malignant neoplasm: Secondary | ICD-10-CM | POA: Diagnosis not present

## 2019-05-01 DIAGNOSIS — Z85828 Personal history of other malignant neoplasm of skin: Secondary | ICD-10-CM | POA: Diagnosis not present

## 2019-05-01 DIAGNOSIS — D225 Melanocytic nevi of trunk: Secondary | ICD-10-CM | POA: Diagnosis not present

## 2019-05-04 DIAGNOSIS — K7469 Other cirrhosis of liver: Secondary | ICD-10-CM | POA: Diagnosis not present

## 2019-05-24 DIAGNOSIS — E782 Mixed hyperlipidemia: Secondary | ICD-10-CM | POA: Diagnosis not present

## 2019-05-24 DIAGNOSIS — D696 Thrombocytopenia, unspecified: Secondary | ICD-10-CM | POA: Diagnosis not present

## 2019-05-24 DIAGNOSIS — E119 Type 2 diabetes mellitus without complications: Secondary | ICD-10-CM | POA: Diagnosis not present

## 2019-07-05 DIAGNOSIS — M545 Low back pain: Secondary | ICD-10-CM | POA: Diagnosis not present

## 2019-07-20 DIAGNOSIS — G4733 Obstructive sleep apnea (adult) (pediatric): Secondary | ICD-10-CM | POA: Diagnosis not present

## 2019-08-17 ENCOUNTER — Other Ambulatory Visit: Payer: Self-pay | Admitting: Gastroenterology

## 2019-08-17 DIAGNOSIS — K7460 Unspecified cirrhosis of liver: Secondary | ICD-10-CM

## 2019-08-21 ENCOUNTER — Ambulatory Visit: Payer: BLUE CROSS/BLUE SHIELD | Admitting: Neurology

## 2019-08-21 DIAGNOSIS — R350 Frequency of micturition: Secondary | ICD-10-CM | POA: Diagnosis not present

## 2019-08-21 DIAGNOSIS — N401 Enlarged prostate with lower urinary tract symptoms: Secondary | ICD-10-CM | POA: Diagnosis not present

## 2019-08-22 ENCOUNTER — Ambulatory Visit (INDEPENDENT_AMBULATORY_CARE_PROVIDER_SITE_OTHER): Payer: BC Managed Care – PPO | Admitting: Neurology

## 2019-08-22 ENCOUNTER — Encounter: Payer: Self-pay | Admitting: Neurology

## 2019-08-22 ENCOUNTER — Other Ambulatory Visit: Payer: Self-pay

## 2019-08-22 VITALS — BP 135/80 | HR 66 | Temp 97.7°F | Ht 69.0 in | Wt 239.0 lb

## 2019-08-22 DIAGNOSIS — Z9989 Dependence on other enabling machines and devices: Secondary | ICD-10-CM

## 2019-08-22 DIAGNOSIS — G4733 Obstructive sleep apnea (adult) (pediatric): Secondary | ICD-10-CM

## 2019-08-22 NOTE — Patient Instructions (Signed)
Please continue using your CPAP regularly. While your insurance requires that you use CPAP at least 4 hours each night on 70% of the nights, I recommend, that you not skip any nights and use it throughout the night if you can. Getting used to CPAP and staying with the treatment long term does take time and patience and discipline. Untreated obstructive sleep apnea when it is moderate to severe can have an adverse impact on cardiovascular health and raise her risk for heart disease, arrhythmias, hypertension, congestive heart failure, stroke and diabetes. Untreated obstructive sleep apnea causes sleep disruption, nonrestorative sleep, and sleep deprivation. This can have an impact on your day to day functioning and cause daytime sleepiness and impairment of cognitive function, memory loss, mood disturbance, and problems focussing. Using CPAP regularly can improve these symptoms.  Keep up the good work! We can see you in 1 year, you can see one of our nurse practitioners as you are stable. 

## 2019-08-22 NOTE — Progress Notes (Signed)
Subjective:    Patient ID: Mike Dunn is a 63 y.o. male.  HPI     Interim history:   Mike Dunn is a 63 year old right-handed gentleman with an underlying medical history of prostatitis, history of UTI, BPH, hyperlipidemia, non-alcoholic liver cirrhosis and esophageal varices, and obesity, who presents for follow-up consultation of his obstructive sleep apnea, well established on CPAP therapy. The patient is unaccompanied today.  I last saw him on 02/16/2018, at which time we talked about his sleep study results, he reported better sleep quality, better sleep consolidation, improvement in his nocturia and daytime somnolence.  He was compliant with treatment.  He had an interim issue with cellulitis in April 2019, he was treated with IV antibiotics and finished oral antibiotic treatment.    He saw Cecille Rubin, nurse practitioner in the interim on 08/18/2018, at which time he was compliant with treatment and doing well, he was advised to follow-up in 1 year routinely.   Today, 08/22/2019: I reviewed his CPAP compliance data from 07/22/2019 through 08/20/2019 which is a total of 30 days, during which time he used his CPAP every night with percent use days greater than 4 hours at 100%, indicating superb compliance with an average usage of 8 hours and 5 minutes, residual AHI 0.6/h, at goal, leak on the higher side with a 95th percentile at 29 L/min on a pressure of 8 cm with EPR of 2.  He reports doing well with his CPAP, sometimes he has to tighten the mask too much and it leaves an imprint on his face.  He uses a traditionally shaped fullface mask, generally tolerates it well.  He just got new supplies.  He retired on 08/02/2019 and will have insurance coverage till the end of this month and then [employer] coverage through the end of this year, then he will switch over to [employer].     The patient's allergies, current medications, family history, past medical history, past social history, past surgical  history and problem list were reviewed and updated as appropriate.    Previously (copied from previous notes for reference):     I first met him on 09/03/2007. At the request of his urologist, at which time he reported snoring and daytime somnolence as well as significant nocturia. I suggested we proceed with a sleep study for concern for underlying OSA. He had a baseline sleep study, followed by a CPAP titration study. I went over his test results with him in detail today. Baseline sleep study from 10/15/2017 showed a sleep latency of 12 minutes, REM latency markedly delayed at 257 minutes and sleep efficiency was 82.2%. He had an increased percentage of stage II sleep, slow-wave sleep was adequate and REM sleep was reduced at 9.5%. Total AHI was in the mild range at 13.0 per hour, REM AHI in the moderate range at 21.3 per hour and supine AHI was also in the near severe range at 29.4 per hour, average oxygen saturation was 94%, nadir was 85%. He had no significant PLMS. Based on his sleep related complaints and sleep study results I suggested we proceed with CPAP treatment. He had a CPAP titration study on 11/11/2017. Sleep efficiency was only 46.1%, sleep latency delayed at 67.5 minutes, REM latency also delayed at 231 minutes. He had an increased percentage of slow-wave sleep and a markedly decreased percentage of REM sleep at 6.5%. He was fitted with a medium fullface mask and CPAP was titrated from 5 cm to 8 cm. On the  final pressure his AHI was 0 per hour with nonsupine REM sleep achieved an O2 nadir of 89%. He had no significant PLMS. Based on his test results I prescribed CPAP therapy for home use at a pressure of 8 cm via full facemask.   I reviewed his CPAP compliance data from 01/16/2018 through 02/14/2018 which is a total of 30 days, during which time he used his CPAP every night with percent used days greater than 4 hours at 97%, indicating excellent compliance with an average usage of 7 hours  and 17 minutes, residual AHI at goal at 0.5 per hour, leak acceptable with the 95th percentile at 10.4 L/m on a pressure of 8 cm with EPR of 3.    09/02/2017: (He) reports snoring and excessive daytime somnolence as well as nocturia. I reviewed your office note from 07/29/2017, which you kindly included. He does not sleep through the night. His Epworth sleepiness score is 9 out of 24, fatigue score is 52 out of 63. He lives with his wife. They have 2 children. He is a nonsmoker and does not utilize alcohol, he drinks caffeine in the form of coffee, usually one cup per day.  He reports stress at work. He had a change in his position earlier this year. He tries to be in bed around 8:30 or 9, he often wakes up multiple times throughout the night, often to go to the bathroom. He has nocturia up to 4 times each night. Lately with a new bladder medication it improved to about 2 times per night. He does not have morning headaches. He had a tonsillectomy and adenoidectomy at age 48 or 34. His younger brother has sleep apnea and has a CPAP machine. His father most likely had sleep apnea as well. Patient takes melatonin 3 mg at night. His rise time is 5 AM. He often takes a nap in the afternoons. He wakes up gasping and with palpitations at times. He does not have telltale symptoms of restless leg syndrome. His wife sleeps in a separate bedroom typically.  His Past Medical History Is Significant For: Past Medical History:  Diagnosis Date  . BPH (benign prostatic hyperplasia)   . Cancer ([place])   . Cirrhosis, nonalcoholic ([place])   . Diabetes mellitus without complication ([place])   . Hyperlipidemia   . Obesity   . Prostatitis, chronic   . Spermatocele of epididymis 2014    His Past Surgical History Is Significant For: Past Surgical History:  Procedure Laterality Date  . BASAL CELL CARCINOMA EXCISION    . CARPAL TUNNEL RELEASE    . HERNIA REPAIR     ventral and inguinal   . MOUTH SURGERY    . TONSILLECTOMY       His Family History Is Significant For: Family History  Problem Relation Age of Onset  . Hypertension Mother   . Heart disease Father   . Nephrolithiasis Brother     His Social History Is Significant For: Social History   Socioeconomic History  . Marital status: Married    Spouse name: Not on file  . Number of children: Not on file  . Years of education: Not on file  . Highest education level: Not on file  Occupational History  . Not on file  Social Needs  . Financial resource strain: Not on file  . Food insecurity    Worry: Not on file    Inability: Not on file  . Transportation needs    Medical: Not on file  Non-medical: Not on file  Tobacco Use  . Smoking status: Never Smoker  . Smokeless tobacco: Never Used  Substance and Sexual Activity  . Alcohol use: No  . Drug use: No  . Sexual activity: Never  Lifestyle  . Physical activity    Days per week: Not on file    Minutes per session: Not on file  . Stress: Not on file  Relationships  . Social Herbalist on phone: Not on file    Gets together: Not on file    Attends religious service: Not on file    Active member of club or organization: Not on file    Attends meetings of clubs or organizations: Not on file    Relationship status: Not on file  Other Topics Concern  . Not on file  Social History Narrative  . Not on file    His Allergies Are:  Allergies  Allergen Reactions  . Tamsulosin   . Zocor [Simvastatin] Other (See Comments)    Caused numbness  :   His Current Medications Are:  Outpatient Encounter Medications as of 08/22/2019  Medication Sig  . alfuzosin (UROXATRAL) 10 MG 24 hr tablet Take 10 mg daily by mouth.  . co-enzyme Q-10 50 MG capsule Take 50 mg by mouth daily.    . CORGARD 40 MG tablet Take 40 mg daily by mouth.  . fenofibrate 160 MG tablet Take 160 mg daily by mouth.  . ferrous sulfate 325 (65 FE) MG tablet Take 325 mg daily with breakfast by mouth.  . fish  oil-omega-3 fatty acids 1000 MG capsule Take 1 g by mouth daily.    . Melatonin 10 MG CAPS Take by mouth.  . Multiple Vitamins-Minerals (MULTI FOR HIM 50+ PO) Take 1 tablet by mouth daily.    . pantoprazole (PROTONIX) 40 MG tablet Take 40 mg by mouth daily.  . Probiotic Product (ALIGN) 4 MG CAPS Take 4 mg by mouth daily.  . rosuvastatin (CRESTOR) 10 MG tablet Take 10 mg by mouth daily.     No facility-administered encounter medications on file as of 08/22/2019.   :  Review of Systems:  Out of a complete 14 point review of systems, all are reviewed and negative with the exception of these symptoms as listed below: Review of Systems  Neurological:       Pt presents today to discuss his cpap. Pt reports that his cpap is going well.    Objective:  Neurological Exam  Physical Exam Physical Examination:   Vitals:   08/22/19 0817  BP: 135/80  Pulse: 66  Temp: 97.7 F (36.5 C)    General Examination: The patient is a very pleasant 63 y.o. male in no acute distress. He appears well-developed and well-nourished and well groomed.   HEENT:Normocephalic, atraumatic, pupils are equal, round and reactive to light, Corrective eyeglasses in place, hearing aids in place.  Extraocular tracking is preserved.  Face is symmetric with normal facial animation, hearing is grossly intact.  Airway examination reveals no obvious changes, mild mouth dryness noted, moderate airway crowding, tongue protrudes centrally in palate elevates symmetrically, tonsils are absent.  Chest:Clear to auscultation without wheezing, rhonchi or crackles noted.  Heart:S1+S2+0, regular and normal without murmurs, rubs or gallops noted.   Abdomen:Soft, non-tender and non-distended with normal bowel sounds appreciated on auscultation.  Extremities:There is no obvious abnormality.   Skin: Warm and dry without trophic changes noted.  Musculoskeletal: exam reveals no obvious joint deformities, tenderness or joint  swelling or erythema.   Neurologically:  Mental status: The patient is awake, alert and oriented in all 4 spheres.Herimmediate and remote memory, attention, language skills and fund of knowledge are appropriate. There is no evidence of aphasia, agnosia, apraxia or anomia. Speech is clear with normal prosody and enunciation. Thought process is linear. Mood is normaland affect is normal.  Cranial nerves II - XII are as described above under HEENT exam.  Motor exam: Normal bulk, strength and tone is noted. There is no tremor. Fine motor skills and coordination: grossly intact.  Cerebellar testing: No dysmetria or intention tremor. There is no truncal or gait ataxia.  Sensory exam: intact to light touch in the upper and lower extremities.  Gait, station and balance:Hestands easily. No veering to one side is noted. No leaning to one side is noted. Posture is age-appropriate and stance is narrow based. Gait showsnormalstride length and normalpace. No problems turning are noted.    Assessmentand Plan:   In summary,Mike N Ollisis a very pleasant 63 year oldmalewith an underlying medical history of prostatitis, history of UTI, BPH, hyperlipidemia, non alcoholic liver cirrhosis and esophageal varices, History of cellulitis, history of cellulitis, and obesity, who presents for follow-up consultation of his obstructive sleep apnea, Well-established on CPAP therapy.  He had a baseline sleep study on 10/15/2017, and a subsequent CPAP titration study.  He continues to be fully compliant with CPAP therapy.  He continues to benefit from it.  He uses a traditionally shaped full facemask, he is encouraged to explore other options such as a hybrid fullface mask, for example the [employer] or ResMed F 30i. He is commended for his treatment adherence.  Since he is stable he can follow-up routinely in 1 year, he is advised to see one of our nurse practitioners.  I renewed his CPAP supply  prescription.  We may have to put another prescription and after he change his insurance next year.  I answered all his questions today and he was in agreement with the plan. I spent 20 minutes in total face-to-face time with the patient, more than 50% of which was spent in counseling and coordination of care, reviewing test results, reviewing medication and discussing or reviewing the diagnosis of OSA, its prognosis and treatment options. Pertinent laboratory and imaging test results that were available during this visit with the patient were reviewed by me and considered in my medical decision making (see chart for details).

## 2019-08-22 NOTE — Progress Notes (Signed)
Order for cpap supplies sent to [facility] via community message. Confirmation received that the order transmitted was successful.  

## 2019-08-25 DIAGNOSIS — N401 Enlarged prostate with lower urinary tract symptoms: Secondary | ICD-10-CM | POA: Diagnosis not present

## 2019-08-25 DIAGNOSIS — R350 Frequency of micturition: Secondary | ICD-10-CM | POA: Diagnosis not present

## 2019-08-28 DIAGNOSIS — M545 Low back pain: Secondary | ICD-10-CM | POA: Diagnosis not present

## 2019-08-28 DIAGNOSIS — M62838 Other muscle spasm: Secondary | ICD-10-CM | POA: Diagnosis not present

## 2019-08-28 DIAGNOSIS — R350 Frequency of micturition: Secondary | ICD-10-CM | POA: Diagnosis not present

## 2019-08-28 DIAGNOSIS — M6281 Muscle weakness (generalized): Secondary | ICD-10-CM | POA: Diagnosis not present

## 2019-09-05 DIAGNOSIS — M545 Low back pain: Secondary | ICD-10-CM | POA: Diagnosis not present

## 2019-09-05 DIAGNOSIS — M6281 Muscle weakness (generalized): Secondary | ICD-10-CM | POA: Diagnosis not present

## 2019-09-05 DIAGNOSIS — M62838 Other muscle spasm: Secondary | ICD-10-CM | POA: Diagnosis not present

## 2019-09-05 DIAGNOSIS — R350 Frequency of micturition: Secondary | ICD-10-CM | POA: Diagnosis not present

## 2019-09-07 ENCOUNTER — Ambulatory Visit
Admission: RE | Admit: 2019-09-07 | Discharge: 2019-09-07 | Disposition: A | Discharge status: Home / Self Care | Payer: BC Managed Care – PPO | Source: Ambulatory Visit | Attending: Gastroenterology | Admitting: Gastroenterology

## 2019-09-07 DIAGNOSIS — K8020 Calculus of gallbladder without cholecystitis without obstruction: Secondary | ICD-10-CM | POA: Diagnosis not present

## 2019-09-07 DIAGNOSIS — K7460 Unspecified cirrhosis of liver: Secondary | ICD-10-CM

## 2019-09-11 DIAGNOSIS — R350 Frequency of micturition: Secondary | ICD-10-CM | POA: Diagnosis not present

## 2019-09-11 DIAGNOSIS — M62838 Other muscle spasm: Secondary | ICD-10-CM | POA: Diagnosis not present

## 2019-09-11 DIAGNOSIS — M6281 Muscle weakness (generalized): Secondary | ICD-10-CM | POA: Diagnosis not present

## 2019-09-11 DIAGNOSIS — M6289 Other specified disorders of muscle: Secondary | ICD-10-CM | POA: Diagnosis not present

## 2019-10-02 DIAGNOSIS — M6281 Muscle weakness (generalized): Secondary | ICD-10-CM | POA: Diagnosis not present

## 2019-10-02 DIAGNOSIS — R350 Frequency of micturition: Secondary | ICD-10-CM | POA: Diagnosis not present

## 2019-10-02 DIAGNOSIS — M6289 Other specified disorders of muscle: Secondary | ICD-10-CM | POA: Diagnosis not present

## 2019-10-02 DIAGNOSIS — M62838 Other muscle spasm: Secondary | ICD-10-CM | POA: Diagnosis not present

## 2019-10-12 DIAGNOSIS — Z0000 Encounter for general adult medical examination without abnormal findings: Secondary | ICD-10-CM | POA: Diagnosis not present

## 2019-10-12 DIAGNOSIS — E119 Type 2 diabetes mellitus without complications: Secondary | ICD-10-CM | POA: Diagnosis not present

## 2019-10-12 DIAGNOSIS — K219 Gastro-esophageal reflux disease without esophagitis: Secondary | ICD-10-CM | POA: Diagnosis not present

## 2019-10-12 DIAGNOSIS — E782 Mixed hyperlipidemia: Secondary | ICD-10-CM | POA: Diagnosis not present

## 2019-10-12 DIAGNOSIS — D696 Thrombocytopenia, unspecified: Secondary | ICD-10-CM | POA: Diagnosis not present

## 2019-11-18 ENCOUNTER — Ambulatory Visit: Payer: Medicare Other | Attending: Internal Medicine

## 2019-11-18 DIAGNOSIS — Z23 Encounter for immunization: Secondary | ICD-10-CM | POA: Insufficient documentation

## 2019-11-18 NOTE — Progress Notes (Signed)
   Covid-19 Vaccination Clinic  Name:  SHAHBAZ PLACHY    MRN: [id number] DOB: Oct 05, 1956  11/18/2019  [name] was observed post Covid-19 immunization for 15 minutes without incidence. He was provided with Vaccine Information Sheet and instruction to access the V-Safe system.   [name] was instructed to call 911 with any severe reactions post vaccine: Marland Kitchen Difficulty breathing  . Swelling of your face and throat  . A fast heartbeat  . A bad rash all over your body  . Dizziness and weakness    Immunizations Administered    Name Date Dose VIS Date Route   Pfizer COVID-19 Vaccine 11/18/2019 11:44 AM 0.3 mL 10/06/2019 Intramuscular   Manufacturer: [place]   Lot: [id number]   [place]: [id number]

## 2019-12-09 ENCOUNTER — Ambulatory Visit: Payer: Medicare Other | Attending: Internal Medicine

## 2019-12-09 DIAGNOSIS — Z23 Encounter for immunization: Secondary | ICD-10-CM | POA: Insufficient documentation

## 2019-12-09 NOTE — Progress Notes (Signed)
   Covid-19 Vaccination Clinic  Name:  Mike Dunn    MRN: [id number] DOB: 1955-12-03  12/09/2019  Mike Dunn was observed post Covid-19 immunization for 15 minutes without incidence. He was provided with Vaccine Information Sheet and instruction to access the V-Safe system.   Mike Dunn was instructed to call 911 with any severe reactions post vaccine: Marland Kitchen Difficulty breathing  . Swelling of your face and throat  . A fast heartbeat  . A bad rash all over your body  . Dizziness and weakness    Immunizations Administered    Name Date Dose VIS Date Route   Pfizer COVID-19 Vaccine 12/09/2019  9:49 AM 0.3 mL 10/06/2019 Intramuscular   Manufacturer: [place]   Lot: [id number]   [place]: [id number]

## 2020-01-16 ENCOUNTER — Other Ambulatory Visit: Payer: Self-pay | Admitting: Family Medicine

## 2020-01-16 DIAGNOSIS — M545 Low back pain, unspecified: Secondary | ICD-10-CM

## 2020-01-30 DIAGNOSIS — R251 Tremor, unspecified: Secondary | ICD-10-CM | POA: Insufficient documentation

## 2020-01-30 DIAGNOSIS — R413 Other amnesia: Secondary | ICD-10-CM | POA: Insufficient documentation

## 2020-01-30 DIAGNOSIS — R259 Unspecified abnormal involuntary movements: Secondary | ICD-10-CM | POA: Insufficient documentation

## 2020-02-05 ENCOUNTER — Other Ambulatory Visit: Payer: Self-pay

## 2020-02-05 ENCOUNTER — Ambulatory Visit
Admission: RE | Admit: 2020-02-05 | Discharge: 2020-02-05 | Disposition: A | Discharge status: Home / Self Care | Payer: Medicare Other | Source: Ambulatory Visit | Attending: Family Medicine | Admitting: Family Medicine

## 2020-02-05 DIAGNOSIS — M545 Low back pain, unspecified: Secondary | ICD-10-CM

## 2020-02-22 ENCOUNTER — Other Ambulatory Visit: Payer: Self-pay | Admitting: Gastroenterology

## 2020-02-22 DIAGNOSIS — K7469 Other cirrhosis of liver: Secondary | ICD-10-CM

## 2020-02-27 ENCOUNTER — Ambulatory Visit
Admission: RE | Admit: 2020-02-27 | Discharge: 2020-02-27 | Disposition: A | Discharge status: Home / Self Care | Payer: Medicare Other | Source: Ambulatory Visit | Attending: Gastroenterology | Admitting: Gastroenterology

## 2020-02-27 DIAGNOSIS — K7469 Other cirrhosis of liver: Secondary | ICD-10-CM

## 2020-04-18 ENCOUNTER — Ambulatory Visit (INDEPENDENT_AMBULATORY_CARE_PROVIDER_SITE_OTHER): Payer: Medicare Other | Admitting: Podiatry

## 2020-04-18 ENCOUNTER — Telehealth: Payer: Self-pay | Admitting: Podiatry

## 2020-04-18 ENCOUNTER — Other Ambulatory Visit: Payer: Self-pay

## 2020-04-18 ENCOUNTER — Encounter: Payer: Self-pay | Admitting: Podiatry

## 2020-04-18 DIAGNOSIS — M79675 Pain in left toe(s): Secondary | ICD-10-CM

## 2020-04-18 DIAGNOSIS — B351 Tinea unguium: Secondary | ICD-10-CM

## 2020-04-18 DIAGNOSIS — L600 Ingrowing nail: Secondary | ICD-10-CM | POA: Diagnosis not present

## 2020-04-18 DIAGNOSIS — M79674 Pain in right toe(s): Secondary | ICD-10-CM | POA: Diagnosis not present

## 2020-04-18 DIAGNOSIS — L03031 Cellulitis of right toe: Secondary | ICD-10-CM | POA: Diagnosis not present

## 2020-04-18 NOTE — Telephone Encounter (Signed)
Pt wife called and wanted to know if they can start the soaks now or how long to wait please assist

## 2020-04-18 NOTE — Telephone Encounter (Signed)
Called patient and stated to start soaking the toe tomorrow. Mike Dunn

## 2020-04-18 NOTE — Progress Notes (Signed)
Subjective:  Patient ID: Mike Dunn, male    DOB: [birth year],  MRN: [id number]  Chief Complaint  Patient presents with  . Nail Problem    the right ingrown toenail    64 y.o. male presents with the above complaint.  Right hallux nail pain, especially lateral border present for almost 2 weeks.  Saw his PCP Dr. Moreen Fowler on 6/15 who prescribed him cephalexin and referred him to our office.  He finished cephalexin on this past Tuesday.  Noted mild improvement after taking this.  He is also concerned that the left hallux nail is also becoming incurvated and ingrowing.  He is here today with his wife.  He says of his toe 2 weeks ago which may have brought this on.  States he is borderline diabetic, is not on current medication for this.   Review of Systems: Negative except as noted in the HPI. Denies N/V/F/Ch.  Past Medical History:  Diagnosis Date  . BPH (benign prostatic hyperplasia)   . Cancer ([place])   . Cirrhosis, nonalcoholic ([place])   . Diabetes mellitus without complication ([place])   . Hyperlipidemia   . Obesity   . Prostatitis, chronic   . Spermatocele of epididymis 2014    Current Outpatient Medications:  .  alfuzosin (UROXATRAL) 10 MG 24 hr tablet, Take 10 mg daily by mouth., Disp: , Rfl: 11 .  co-enzyme Q-10 50 MG capsule, Take 50 mg by mouth daily.  , Disp: , Rfl:  .  CORGARD 40 MG tablet, Take 40 mg daily by mouth., Disp: , Rfl: 4 .  fenofibrate 160 MG tablet, Take 160 mg daily by mouth., Disp: , Rfl:  .  ferrous sulfate 325 (65 FE) MG tablet, Take 325 mg daily with breakfast by mouth., Disp: , Rfl:  .  fish oil-omega-3 fatty acids 1000 MG capsule, Take 1 g by mouth daily.  , Disp: , Rfl:  .  Melatonin 10 MG CAPS, Take by mouth., Disp: , Rfl:  .  Multiple Vitamins-Minerals (MULTI FOR HIM 50+ PO), Take 1 tablet by mouth daily.  , Disp: , Rfl:  .  pantoprazole (PROTONIX) 40 MG tablet, Take 40 mg by mouth daily., Disp: , Rfl:  .  Probiotic Product (ALIGN) 4 MG CAPS, Take 4 mg by  mouth daily., Disp: , Rfl:  .  rosuvastatin (CRESTOR) 10 MG tablet, Take 10 mg by mouth daily.  , Disp: , Rfl:   Social History   Tobacco Use  Smoking Status Never Smoker  Smokeless Tobacco Never Used    Allergies  Allergen Reactions  . Tamsulosin   . Zocor [Simvastatin] Other (See Comments)    Caused numbness   Objective:  There were no vitals filed for this visit. There is no height or weight on file to calculate BMI. Constitutional Well developed. Well nourished.  Vascular Dorsalis pedis pulses palpable bilaterally. Posterior tibial pulses palpable bilaterally. Capillary refill normal to all digits.  No cyanosis or clubbing noted. Pedal hair growth normal.  Neurologic Normal speech. Oriented to person, place, and time. Epicritic sensation to light touch grossly present bilaterally.  Dermatologic Painful ingrowing nail at lateral nail borders of the hallux nail right with paronychia, and mild erythema localized to the lateral hallux.  The medial border of the right hallux and bilateral borders of the left hallux are incurvated with the distal edges digging into the skin, without full ingrowth or paronychia.. No other open wounds. No skin lesions.  Orthopedic: Normal joint ROM without pain or  crepitus bilaterally. No visible deformities. No bony tenderness.   Radiographs: None Assessment:   1. Paronychia of toe of right foot due to ingrown toenail   2. Ingrown nail of great toe of left foot   3. Pain in toe of left foot   4. Pain in toe of right foot    Plan:  Patient was evaluated and treated and all questions answered.  Ingrown Nail, right -Patient elects to proceed with minor surgery to remove ingrown toenail today. Consent reviewed and signed by patient. -Ingrown nail excised. See procedure note. -Educated on post-procedure care including soaking. Written instructions provided and reviewed. -Patient to follow up in 1 weeks for nail check. -The right medial and  left lateral and medial offending nail borders were then debrided and reduced in a slant back fashion to remove the distal portion of the nail to prevent this from becoming a paronychia.  No local anesthesia required here.  Procedure well-tolerated.  Procedure: Excision of Ingrown Toenail Location: Right 1st toe lateral nail borders. Anesthesia: Lidocaine 1% plain; 1.5 mL and Marcaine 0.5% plain; 1.5 mL, digital block. Skin Prep: Betadine. Dressing: Silvadene; telfa; dry, sterile, compression dressing. Technique: Following skin prep, the toe was exsanguinated and a tourniquet was secured at the base of the toe. The affected nail border was freed, split with a nail splitter, and excised. The tourniquet was then removed and sterile dressing applied. Disposition: Patient tolerated procedure well. Patient to return in 1 weeks for follow-up.   Lanae Crumbly, DPM 04/18/2020     Return in about 1 week (around 04/25/2020).

## 2020-04-18 NOTE — Patient Instructions (Signed)

## 2020-04-25 ENCOUNTER — Encounter: Payer: Self-pay | Admitting: Podiatry

## 2020-04-25 ENCOUNTER — Other Ambulatory Visit: Payer: Self-pay

## 2020-04-25 ENCOUNTER — Ambulatory Visit (INDEPENDENT_AMBULATORY_CARE_PROVIDER_SITE_OTHER): Payer: Medicare Other | Admitting: Podiatry

## 2020-04-25 DIAGNOSIS — S99921A Unspecified injury of right foot, initial encounter: Secondary | ICD-10-CM

## 2020-04-25 DIAGNOSIS — L600 Ingrowing nail: Secondary | ICD-10-CM

## 2020-04-25 DIAGNOSIS — L03031 Cellulitis of right toe: Secondary | ICD-10-CM

## 2020-04-25 NOTE — Progress Notes (Signed)
  Subjective:  Patient ID: Mike Dunn, male    DOB: 05-30-1956,  MRN: [id number]  Chief Complaint  Patient presents with  . Nail Problem    1 week nail check right great toe    64 y.o. male presents with the above complaint. The history was reviewed and confirmed with the patient.  No issues since last week.  Pain has been relieved.  He has been soaking twice daily as directed.  He also bumped his right fifth toe and thinks that may be wondering if the toenail is injured.  Review of Systems: Negative except as noted in the HPI. Denies N/V/F/Ch. Objective:  There were no vitals filed for this visit.  Lower extremity focused examination: Right hallux lateral border avulsion site healing well.  Minimal maceration.  No drainage.  No signs of infection.  The right fifth digit has some subungual hematoma this involves less than a quarter of the distal nail plate.  Radiology  Assessment & Plan:  No diagnosis found.  Patient was evaluated and treated and all questions answered.  -His avulsion site is healing well.  I told him he may discontinue soaks or continue for 1 week if he feels it is beneficial for him.  If the issue recurs she will return to see me as needed if he requires further avulsion on any other nail border or if he would like permanent removal of the offending nail borders. -He appears to have a small contusion of the right fifth toenail.  The distal tip of the toenail had some subungual hematoma.  I debrided this sharply with a nail nipper.  The nail that was remaining was well adhered to the bed and should this arise. -He would like to return for continued diabetic foot exams and routine diabetic foot care.  I encouraged him to follow-up with [place] in 3 months with Dr. Lewie Loron, DPM 04/25/2020      Return in about 3 months (around 07/26/2020) for diabetic nail trim.

## 2020-04-25 NOTE — Patient Instructions (Signed)
Diabetes Mellitus and Foot Care Foot care is an important part of your health, especially when you have diabetes. Diabetes may cause you to have problems because of poor blood flow (circulation) to your feet and legs, which can cause your skin to:  Become thinner and drier.  Break more easily.  Heal more slowly.  Peel and crack. You may also have nerve damage (neuropathy) in your legs and feet, causing decreased feeling in them. This means that you may not notice minor injuries to your feet that could lead to more serious problems. Noticing and addressing any potential problems early is the best way to prevent future foot problems. How to care for your feet Foot hygiene  Wash your feet daily with warm water and mild soap. Do not use hot water. Then, pat your feet and the areas between your toes until they are completely dry. Do not soak your feet as this can dry your skin.  Trim your toenails straight across. Do not dig under them or around the cuticle. File the edges of your nails with an emery board or nail file.  Apply a moisturizing lotion or petroleum jelly to the skin on your feet and to dry, brittle toenails. Use lotion that does not contain alcohol and is unscented. Do not apply lotion between your toes. Shoes and socks  Wear clean socks or stockings every day. Make sure they are not too tight. Do not wear knee-high stockings since they may decrease blood flow to your legs.  Wear shoes that fit properly and have enough cushioning. Always look in your shoes before you put them on to be sure there are no objects inside.  To break in new shoes, wear them for just a few hours a day. This prevents injuries on your feet. Wounds, scrapes, corns, and calluses  Check your feet daily for blisters, cuts, bruises, sores, and redness. If you cannot see the bottom of your feet, use a mirror or ask someone for help.  Do not cut corns or calluses or try to remove them with medicine.  If you  find a minor scrape, cut, or break in the skin on your feet, keep it and the skin around it clean and dry. You may clean these areas with mild soap and water. Do not clean the area with peroxide, alcohol, or iodine.  If you have a wound, scrape, corn, or callus on your foot, look at it several times a day to make sure it is healing and not infected. Check for: ? Redness, swelling, or pain. ? Fluid or blood. ? Warmth. ? Pus or a bad smell. General instructions  Do not cross your legs. This may decrease blood flow to your feet.  Do not use heating pads or hot water bottles on your feet. They may burn your skin. If you have lost feeling in your feet or legs, you may not know this is happening until it is too late.  Protect your feet from hot and cold by wearing shoes, such as at the beach or on hot pavement.  Schedule a complete foot exam at least once a year (annually) or more often if you have foot problems. If you have foot problems, report any cuts, sores, or bruises to your health care provider immediately. Contact a health care provider if:  You have a medical condition that increases your risk of infection and you have any cuts, sores, or bruises on your feet.  You have an injury that is not   healing.  You have redness on your legs or feet.  You feel burning or tingling in your legs or feet.  You have pain or cramps in your legs and feet.  Your legs or feet are numb.  Your feet always feel cold.  You have pain around a toenail. Get help right away if:  You have a wound, scrape, corn, or callus on your foot and: ? You have pain, swelling, or redness that gets worse. ? You have fluid or blood coming from the wound, scrape, corn, or callus. ? Your wound, scrape, corn, or callus feels warm to the touch. ? You have pus or a bad smell coming from the wound, scrape, corn, or callus. ? You have a fever. ? You have a red line going up your leg. Summary  Check your feet every day  for cuts, sores, red spots, swelling, and blisters.  Moisturize feet and legs daily.  Wear shoes that fit properly and have enough cushioning.  If you have foot problems, report any cuts, sores, or bruises to your health care provider immediately.  Schedule a complete foot exam at least once a year (annually) or more often if you have foot problems. This information is not intended to replace advice given to you by your health care provider. Make sure you discuss any questions you have with your health care provider. Document Revised: 07/05/2019 Document Reviewed: 11/13/2016 Elsevier Patient Education  2020 Elsevier Inc.  

## 2020-08-08 ENCOUNTER — Ambulatory Visit: Payer: Medicare Other | Admitting: Podiatry

## 2020-08-14 ENCOUNTER — Encounter: Payer: Self-pay | Admitting: Podiatry

## 2020-08-14 ENCOUNTER — Ambulatory Visit (INDEPENDENT_AMBULATORY_CARE_PROVIDER_SITE_OTHER): Payer: Medicare Other | Admitting: Podiatry

## 2020-08-14 ENCOUNTER — Other Ambulatory Visit: Payer: Self-pay

## 2020-08-14 DIAGNOSIS — M79675 Pain in left toe(s): Secondary | ICD-10-CM | POA: Diagnosis not present

## 2020-08-14 DIAGNOSIS — M79674 Pain in right toe(s): Secondary | ICD-10-CM

## 2020-08-14 DIAGNOSIS — B351 Tinea unguium: Secondary | ICD-10-CM

## 2020-08-14 NOTE — Progress Notes (Signed)
This patient returns to the office for evaluation and treatment of long thick painful nails .  This patient is unable to trim his own nails since the patient cannot reach his feet.  Patient says the nails are painful walking and wearing his shoes.  He returns for preventive foot care services.  He presents to the office with his wife.  General Appearance  Alert, conversant and in no acute stress.  Vascular  Dorsalis pedis and posterior tibial  pulses are palpable  bilaterally.  Capillary return is within normal limits  bilaterally. Temperature is within normal limits  bilaterally.  Neurologic  Senn-Weinstein monofilament wire test within normal limits  bilaterally. Muscle power within normal limits bilaterally.  Nails Thick disfigured discolored nails with subungual debris  from hallux to fifth toes bilaterally. No evidence of bacterial infection or drainage bilaterally.  Subungual hematoma sub 1,5 right foot.  Orthopedic  No limitations of motion  feet .  No crepitus or effusions noted.  No bony pathology or digital deformities noted.  Skin  normotropic skin with no porokeratosis noted bilaterally.  No signs of infections or ulcers noted.     Onychomycosis  Pain in toes right foot  Pain in toes left foot  Debridement  of nails  1-5  B/L with a nail nipper.  Nails were then filed using a dremel tool with no incidents.    RTC 3 months   Gardiner Barefoot DPM

## 2020-08-22 ENCOUNTER — Ambulatory Visit (INDEPENDENT_AMBULATORY_CARE_PROVIDER_SITE_OTHER): Payer: Medicare Other | Admitting: Adult Health

## 2020-08-22 ENCOUNTER — Encounter: Payer: Self-pay | Admitting: Adult Health

## 2020-08-22 ENCOUNTER — Other Ambulatory Visit: Payer: Self-pay

## 2020-08-22 VITALS — BP 120/74 | HR 60 | Ht 69.0 in | Wt 230.6 lb

## 2020-08-22 DIAGNOSIS — G4733 Obstructive sleep apnea (adult) (pediatric): Secondary | ICD-10-CM

## 2020-08-22 DIAGNOSIS — Z9989 Dependence on other enabling machines and devices: Secondary | ICD-10-CM | POA: Diagnosis not present

## 2020-08-22 NOTE — Progress Notes (Signed)
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  His download indicates that he uses machine nightly for compliance of 100%.  He uses machine greater than 4 hours each night.  On average he uses his machine 7 hours and 16 minutes.  His residual AHI is 0.6 on 8 cm of water with EPR 3.  Leak in the 95th percentile is 24.3 L/min.  He states that he is not been able to get new supplies because of his insurance.  HISTORY 08/22/2019: I reviewed his CPAP compliance data from 07/22/2019 through 08/20/2019 which is a total of 30 days, during which time he used his CPAP every night with percent use days greater than 4 hours at 100%, indicating superb compliance with an average usage of 8 hours and 5 minutes, residual AHI 0.6/h, at goal, leak on the higher side with a 95th percentile at 29 L/min on a pressure of 8 cm with EPR of 2.  He reports doing well with his CPAP, sometimes he has to tighten the mask too much and it leaves an imprint on his face.  He uses a traditionally shaped fullface mask, generally tolerates it well.  He just got new supplies.  He retired on 08/02/2019 and will have insurance coverage till the end of this month and then [employer] coverage through the end of this year, then he will switch over to [employer].  REVIEW OF SYSTEMS: Out of a complete 14 system review of symptoms, the patient complains only of the following symptoms, and all other reviewed systems are negative.  FSS 58 ESS 15  ALLERGIES: Allergies  Allergen Reactions  . Tamsulosin   . Zocor [Simvastatin] Other (See Comments)    Caused numbness    HOME MEDICATIONS: Outpatient Medications Prior to Visit  Medication Sig Dispense Refill  . alfuzosin (UROXATRAL) 10 MG 24 hr tablet Take 10 mg daily by mouth.  11  . carbidopa-levodopa (SINEMET IR) 25-100  MG tablet     . carvedilol (COREG) 6.25 MG tablet     . clobetasol (TEMOVATE) 0.05 % external solution     . co-enzyme Q-10 50 MG capsule Take 50 mg by mouth daily.      . CORGARD 40 MG tablet Take 40 mg daily by mouth.  4  . fenofibrate 160 MG tablet Take 160 mg daily by mouth.    . ferrous sulfate 325 (65 FE) MG tablet Take 325 mg daily with breakfast by mouth.    . fish oil-omega-3 fatty acids 1000 MG capsule Take 1 g by mouth daily.      . hyoscyamine (ANASPAZ) 0.125 MG TBDP disintergrating tablet     . ketoconazole (NIZORAL) 2 % shampoo     . losartan (COZAAR) 25 MG tablet Take by mouth.    . Melatonin 10 MG CAPS Take by mouth.    . meloxicam (MOBIC) 15 MG tablet     . Multiple Vitamins-Minerals (MULTI FOR HIM 50+ PO) Take 1 tablet by mouth daily.      . pantoprazole (PROTONIX) 40 MG tablet Take 40 mg by mouth daily.    . polyethylene glycol-electrolytes (NULYTELY) 420 g solution     . Probiotic Product (ALIGN) 4 MG CAPS Take 4 mg by mouth daily.    . rosuvastatin (CRESTOR) 10 MG tablet Take 10 mg by  mouth daily.      . cephALEXin (KEFLEX) 500 MG capsule      No facility-administered medications prior to visit.    PAST MEDICAL HISTORY: Past Medical History:  Diagnosis Date  . BPH (benign prostatic hyperplasia)   . Cancer ([place])   . Cirrhosis, nonalcoholic ([place])   . Diabetes mellitus without complication ([place])   . Hyperlipidemia   . Obesity   . Prostatitis, chronic   . Spermatocele of epididymis 2014    PAST SURGICAL HISTORY: Past Surgical History:  Procedure Laterality Date  . BASAL CELL CARCINOMA EXCISION    . CARPAL TUNNEL RELEASE    . HERNIA REPAIR     ventral and inguinal   . MOUTH SURGERY    . TONSILLECTOMY      FAMILY HISTORY: Family History  Problem Relation Age of Onset  . Hypertension Mother   . Heart disease Father   . Nephrolithiasis Brother     SOCIAL HISTORY: Social History   Socioeconomic History  . Marital status: Married    Spouse name:  Not on file  . Number of children: Not on file  . Years of education: Not on file  . Highest education level: Not on file  Occupational History  . Not on file  Tobacco Use  . Smoking status: Never Smoker  . Smokeless tobacco: Never Used  Substance and Sexual Activity  . Alcohol use: No  . Drug use: No  . Sexual activity: Never  Other Topics Concern  . Not on file  Social History Narrative  . Not on file   Social Determinants of Health   Financial Resource Strain:   . Difficulty of Paying Living Expenses: Not on file  Food Insecurity:   . Worried About Charity fundraiser in the Last Year: Not on file  . Ran Out of Food in the Last Year: Not on file  Transportation Needs:   . Lack of Transportation (Medical): Not on file  . Lack of Transportation (Non-Medical): Not on file  Physical Activity:   . Days of Exercise per Week: Not on file  . Minutes of Exercise per Session: Not on file  Stress:   . Feeling of Stress : Not on file  Social Connections:   . Frequency of Communication with Friends and Family: Not on file  . Frequency of Social Gatherings with Friends and Family: Not on file  . Attends Religious Services: Not on file  . Active Member of Clubs or Organizations: Not on file  . Attends Archivist Meetings: Not on file  . Marital Status: Not on file  Intimate Partner Violence:   . Fear of Current or Ex-Partner: Not on file  . Emotionally Abused: Not on file  . Physically Abused: Not on file  . Sexually Abused: Not on file      PHYSICAL EXAM  Vitals:   08/22/20 0916  BP: 120/74  Pulse: 60  Weight: 230 lb 9.6 oz (104.6 kg)  Height: 5\' 9"  (1.753 m)   Body mass index is 34.05 kg/m.  Generalized: Well developed, in no acute distress  Chest: Lungs clear to auscultation bilaterally  Neurological examination  Mentation: Alert oriented to time, place, history taking. Follows all commands speech and language fluent Cranial nerve II-XII: Extraocular  movements were full, visual field were full on confrontational test Head turning and shoulder shrug  were normal and symmetric. Motor: The motor testing reveals 5 over 5 strength of all 4 extremities. Good symmetric motor  tone is noted throughout.  Sensory: Sensory testing is intact to soft touch on all 4 extremities. No evidence of extinction is noted.  Gait and station: Gait is normal.    DIAGNOSTIC DATA (LABS, IMAGING, TESTING) - I reviewed patient records, labs, notes, testing and imaging myself where available.  Lab Results  Component Value Date   WBC 5.9 02/28/2018   HGB 14.9 02/28/2018   HCT 42.1 02/28/2018   MCV 96.8 02/28/2018   PLT 70 (L) 02/28/2018      Component Value Date/Time   NA 136 02/10/2018 0100   NA 140 08/01/2015 1208   K 3.6 02/10/2018 0100   K 3.9 08/01/2015 1208   CL 104 02/10/2018 0100   CO2 22 02/10/2018 0100   CO2 23 08/01/2015 1208   GLUCOSE 138 (H) 02/10/2018 0100   GLUCOSE 112 08/01/2015 1208   BUN 15 02/10/2018 0100   BUN 13.0 08/01/2015 1208   CREATININE 0.60 (L) 02/10/2018 0100   CREATININE 0.8 08/01/2015 1208   CALCIUM 8.5 (L) 02/10/2018 0100   CALCIUM 9.8 08/01/2015 1208   PROT 7.6 08/01/2015 1208   ALBUMIN 4.2 08/01/2015 1208   AST 51 (H) 08/01/2015 1208   ALT 35 08/01/2015 1208   ALKPHOS 55 08/01/2015 1208   BILITOT 1.37 (H) 08/01/2015 1208   GFRNONAA >60 02/10/2018 0100   GFRAA >60 02/10/2018 0100   Lab Results  Component Value Date   CHOL 163 05/09/2011   HDL 38 (L) 05/09/2011   LDLCALC 90 05/09/2011   TRIG 173 (H) 05/09/2011   CHOLHDL 4.3 05/09/2011   Lab Results  Component Value Date   HGBA1C 6.2 (H) 05/10/2011   No results found for: [id number] Lab Results  Component Value Date   TSH 1.987 05/09/2011      ASSESSMENT AND PLAN 64 y.o. year old male  has a past medical history of BPH (benign prostatic hyperplasia), Cancer ([place]), Cirrhosis, nonalcoholic ([place]), Diabetes mellitus without complication ([place]),  Hyperlipidemia, Obesity, Prostatitis, chronic, and Spermatocele of epididymis (2014). here with:  1. OSA on CPAP  - CPAP compliance excellent - Good treatment of AHI  - Encourage patient to use CPAP nightly and > 4 hours each night - F/U in 1 year or sooner if needed   I spent 20 minutes of face-to-face and non-face-to-face time with patient.  This included previsit chart review, lab review, study review, order entry, electronic health record documentation, patient education.  Ward Givens, MSN, NP-C 08/22/2020, 9:46 AM [facility] [address], [place], [place] [address] [phone]

## 2020-08-22 NOTE — Patient Instructions (Signed)
Continue using CPAP nightly and greater than 4 hours each night °If your symptoms worsen or you develop new symptoms please let us know.  ° °

## 2020-09-03 ENCOUNTER — Telehealth: Payer: Self-pay | Admitting: Adult Health

## 2020-09-03 DIAGNOSIS — G4733 Obstructive sleep apnea (adult) (pediatric): Secondary | ICD-10-CM

## 2020-09-03 NOTE — Addendum Note (Signed)
Addended by: Brandon Melnick on: 09/03/2020 02:05 PM   Modules accepted: Orders

## 2020-09-03 NOTE — Telephone Encounter (Signed)
Pt has called to report that he has been told by his DME that they can not send CPAP supplies until they [facility]) makes contact with [facility].  Pt is being told [facility] can not get in contact with our office.  Pt was told to have [facility] to call them at [phone]

## 2020-09-03 NOTE — Addendum Note (Signed)
Addended by: Trudie Buckler on: 09/03/2020 06:04 PM   Modules accepted: Orders

## 2020-09-04 NOTE — Telephone Encounter (Signed)
Order for cpap supplies sent to [facility] via community msg. Confirmation received that the order transmitted was successful.  

## 2020-11-20 ENCOUNTER — Other Ambulatory Visit: Payer: Self-pay

## 2020-11-20 ENCOUNTER — Ambulatory Visit (INDEPENDENT_AMBULATORY_CARE_PROVIDER_SITE_OTHER): Payer: Medicare Other | Admitting: Podiatry

## 2020-11-20 ENCOUNTER — Encounter: Payer: Self-pay | Admitting: Podiatry

## 2020-11-20 DIAGNOSIS — B351 Tinea unguium: Secondary | ICD-10-CM

## 2020-11-20 DIAGNOSIS — M79675 Pain in left toe(s): Secondary | ICD-10-CM | POA: Diagnosis not present

## 2020-11-20 DIAGNOSIS — M79674 Pain in right toe(s): Secondary | ICD-10-CM

## 2020-11-20 NOTE — Progress Notes (Signed)
This patient returns to the office for evaluation and treatment of long thick painful nails .  This patient is unable to trim his own nails since the patient cannot reach his feet.  Patient says the nails are painful walking and wearing his shoes.  He returns for preventive foot care services.  He presents to the office with his wife.  General Appearance  Alert, conversant and in no acute stress.  Vascular  Dorsalis pedis and posterior tibial  pulses are palpable  bilaterally.  Capillary return is within normal limits  bilaterally. Temperature is within normal limits  bilaterally.  Neurologic  Senn-Weinstein monofilament wire test within normal limits  bilaterally. Muscle power within normal limits bilaterally.  Nails Thick disfigured discolored nails with subungual debris  from hallux to fifth toes bilaterally. No evidence of bacterial infection or drainage bilaterally.  Subungual hematoma sub 1,5 right foot.  Orthopedic  No limitations of motion  feet .  No crepitus or effusions noted.  No bony pathology or digital deformities noted.  Skin  normotropic skin with no porokeratosis noted bilaterally.  No signs of infections or ulcers noted.     Onychomycosis  Pain in toes right foot  Pain in toes left foot  Debridement  of nails  1-5  B/L with a nail nipper.  Nails were then filed using a dremel tool with no incidents.    RTC 3 months   Acquanetta Cabanilla DPM  

## 2020-12-30 ENCOUNTER — Telehealth: Payer: Self-pay | Admitting: Adult Health

## 2020-12-30 NOTE — Telephone Encounter (Signed)
Sent CM for pt to [facility] (last order was 08-2020).

## 2020-12-30 NOTE — Telephone Encounter (Signed)
Pt called wanting to know what is going on with the his supply order for [facility]. Pt states he has been having issues getting supplies since last year and he has been having to order them off of [facility] because there is always a problem getting them from [facility]. Please advise.

## 2020-12-31 NOTE — Telephone Encounter (Signed)
RE: cpap supplies Received: Adaline Sill, Wilmer Floor, RN I'm not sure why this hasn't been processed. Will take care of this.      Previous Messages   ----- Message -----  From: Brandon Melnick, RN  Sent: 12/30/2020  9:36 AM EST  To: Vanessa Ralphs, Charmian Muff  Subject: cpap supplies                   Hi Jessica, I got this message this am from pt. Last order I see placed 08-2020 for supplies. Can you help? Linus Salmons 12/27/1955.  Thank you Newman Pies   Pt called wanting to know what is going on with the his supply order for [facility]. Pt states he has been having issues getting supplies since last year and he has been having to order them off of [facility] because there is always a problem getting them from [facility].

## 2021-01-07 ENCOUNTER — Other Ambulatory Visit: Payer: Self-pay | Admitting: Physician Assistant

## 2021-01-07 DIAGNOSIS — K7469 Other cirrhosis of liver: Secondary | ICD-10-CM

## 2021-01-08 NOTE — Telephone Encounter (Signed)
RE: cpap Received: Today Jacquelyne Balint, RN Got it, thank you.

## 2021-01-23 ENCOUNTER — Ambulatory Visit
Admission: RE | Admit: 2021-01-23 | Discharge: 2021-01-23 | Disposition: A | Discharge status: Home / Self Care | Payer: Medicare Other | Source: Ambulatory Visit | Attending: Physician Assistant | Admitting: Physician Assistant

## 2021-01-23 DIAGNOSIS — K7469 Other cirrhosis of liver: Secondary | ICD-10-CM

## 2021-02-05 ENCOUNTER — Other Ambulatory Visit: Payer: Self-pay | Admitting: Physician Assistant

## 2021-02-05 DIAGNOSIS — R1319 Other dysphagia: Secondary | ICD-10-CM

## 2021-02-19 ENCOUNTER — Ambulatory Visit (INDEPENDENT_AMBULATORY_CARE_PROVIDER_SITE_OTHER): Payer: Medicare Other | Admitting: Podiatry

## 2021-02-19 ENCOUNTER — Other Ambulatory Visit: Payer: Self-pay

## 2021-02-19 DIAGNOSIS — M79674 Pain in right toe(s): Secondary | ICD-10-CM | POA: Diagnosis not present

## 2021-02-19 DIAGNOSIS — Z860100 Personal history of colon polyps, unspecified: Secondary | ICD-10-CM | POA: Insufficient documentation

## 2021-02-19 DIAGNOSIS — M722 Plantar fascial fibromatosis: Secondary | ICD-10-CM

## 2021-02-19 DIAGNOSIS — M5430 Sciatica, unspecified side: Secondary | ICD-10-CM | POA: Insufficient documentation

## 2021-02-19 DIAGNOSIS — M5030 Other cervical disc degeneration, unspecified cervical region: Secondary | ICD-10-CM | POA: Insufficient documentation

## 2021-02-19 DIAGNOSIS — N400 Enlarged prostate without lower urinary tract symptoms: Secondary | ICD-10-CM | POA: Insufficient documentation

## 2021-02-19 DIAGNOSIS — E782 Mixed hyperlipidemia: Secondary | ICD-10-CM | POA: Insufficient documentation

## 2021-02-19 DIAGNOSIS — K219 Gastro-esophageal reflux disease without esophagitis: Secondary | ICD-10-CM | POA: Insufficient documentation

## 2021-02-19 DIAGNOSIS — K7469 Other cirrhosis of liver: Secondary | ICD-10-CM | POA: Insufficient documentation

## 2021-02-19 DIAGNOSIS — D696 Thrombocytopenia, unspecified: Secondary | ICD-10-CM | POA: Insufficient documentation

## 2021-02-19 DIAGNOSIS — I8500 Esophageal varices without bleeding: Secondary | ICD-10-CM | POA: Insufficient documentation

## 2021-02-19 DIAGNOSIS — K760 Fatty (change of) liver, not elsewhere classified: Secondary | ICD-10-CM | POA: Insufficient documentation

## 2021-02-19 DIAGNOSIS — B351 Tinea unguium: Secondary | ICD-10-CM | POA: Diagnosis not present

## 2021-02-19 DIAGNOSIS — E119 Type 2 diabetes mellitus without complications: Secondary | ICD-10-CM

## 2021-02-19 DIAGNOSIS — G20 Parkinson's disease: Secondary | ICD-10-CM | POA: Insufficient documentation

## 2021-02-19 DIAGNOSIS — M79675 Pain in left toe(s): Secondary | ICD-10-CM | POA: Diagnosis not present

## 2021-02-19 DIAGNOSIS — G4700 Insomnia, unspecified: Secondary | ICD-10-CM | POA: Insufficient documentation

## 2021-02-19 DIAGNOSIS — I10 Essential (primary) hypertension: Secondary | ICD-10-CM | POA: Insufficient documentation

## 2021-02-19 DIAGNOSIS — F430 Acute stress reaction: Secondary | ICD-10-CM | POA: Insufficient documentation

## 2021-02-19 DIAGNOSIS — K8020 Calculus of gallbladder without cholecystitis without obstruction: Secondary | ICD-10-CM | POA: Insufficient documentation

## 2021-02-19 MED ORDER — MELOXICAM 15 MG PO TABS: 15.0000 mg | ORAL_TABLET | Freq: Every day | ORAL | 0 refills | Status: DC

## 2021-02-19 NOTE — Progress Notes (Addendum)
This patient returns to the office for evaluation and treatment of long thick painful nails .  This patient is unable to trim his own nails since the patient cannot reach his feet.  Patient says the nails are painful walking and wearing his shoes.  He returns for preventive foot care services.  He presents saying he has been experiencing pain in his right heel for 2 weeks.  He says the pain today is 4/5.  General Appearance  Alert, conversant and in no acute stress.  Vascular  Dorsalis pedis and posterior tibial  pulses are palpable  bilaterally.  Capillary return is within normal limits  bilaterally. Temperature is within normal limits  bilaterally.  Neurologic  Senn-Weinstein monofilament wire test within normal limits  bilaterally. Muscle power within normal limits bilaterally.  Nails Thick disfigured discolored nails with subungual debris  from hallux to fifth toes bilaterally. No evidence of bacterial infection or drainage bilaterally.  Subungual hematoma sub 1,5 right foot.  Orthopedic  No limitations of motion  feet .  No crepitus or effusions noted.  No bony pathology or digital deformities noted.  Palpable pain at the insertion plantar fascia right heel.  Skin  normotropic skin with no porokeratosis noted bilaterally.  No signs of infections or ulcers noted.     Onychomycosis  Pain in toes right foot  Pain in toes left foot  Plantar fasciitis  Right foot.  Debridement  of nails  1-5  B/L with a nail nipper.  Nails were then filed using a dremel tool with no incidents.  Discussed plantat fasciitis and prescribed powerstep insoles. Prescribe  Mobic.   RTC 3 months   Gardiner Barefoot DPM

## 2021-02-19 NOTE — Addendum Note (Signed)
Addended by: Judy Pimple D on: 02/19/2021 08:57 AM   Modules accepted: Orders

## 2021-02-25 ENCOUNTER — Ambulatory Visit
Admission: RE | Admit: 2021-02-25 | Discharge: 2021-02-25 | Disposition: A | Discharge status: Home / Self Care | Payer: Medicare Other | Source: Ambulatory Visit | Attending: Physician Assistant | Admitting: Physician Assistant

## 2021-02-25 DIAGNOSIS — R1319 Other dysphagia: Secondary | ICD-10-CM

## 2021-03-17 ENCOUNTER — Ambulatory Visit (INDEPENDENT_AMBULATORY_CARE_PROVIDER_SITE_OTHER): Payer: Medicare Other | Admitting: Podiatry

## 2021-03-17 ENCOUNTER — Ambulatory Visit: Payer: Medicare Other

## 2021-03-17 ENCOUNTER — Other Ambulatory Visit: Payer: Self-pay

## 2021-03-17 DIAGNOSIS — M216X1 Other acquired deformities of right foot: Secondary | ICD-10-CM

## 2021-03-17 DIAGNOSIS — M7661 Achilles tendinitis, right leg: Secondary | ICD-10-CM

## 2021-03-17 DIAGNOSIS — M722 Plantar fascial fibromatosis: Secondary | ICD-10-CM

## 2021-03-17 DIAGNOSIS — M21861 Other specified acquired deformities of right lower leg: Secondary | ICD-10-CM

## 2021-03-17 NOTE — Patient Instructions (Signed)
[facility] [place] [facility]  Address: [address], [address], [place] [address] [place], [place] [address] Hours:   Phone: [phone]   Look for Voltaren gel at the pharmacy over the counter or online (also known as diclofenac 1% gel). Apply to the painful areas 3-4x daily with the supplied dosing card. Allow to dry for 10 minutes before going into socks/shoes   Achilles Tendinitis  with Rehab Achilles tendinitis is a disorder of the Achilles tendon. The Achilles tendon connects the large calf muscles (Gastrocnemius and Soleus) to the heel bone (calcaneus). This tendon is sometimes called the heel cord. It is important for pushing-off and standing on your toes and is important for walking, running, or jumping. Tendinitis is often caused by overuse and repetitive microtrauma. SYMPTOMS  Pain, tenderness, swelling, warmth, and redness may occur over the Achilles tendon even at rest.  Pain with pushing off, or flexing or extending the ankle.  Pain that is worsened after or during activity. CAUSES   Overuse sometimes seen with rapid increase in exercise programs or in sports requiring running and jumping.  Poor physical conditioning (strength and flexibility or endurance).  Running sports, especially training running down hills.  Inadequate warm-up before practice or play or failure to stretch before participation.  Injury to the tendon. PREVENTION   Warm up and stretch before practice or competition.  Allow time for adequate rest and recovery between practices and competition.  Keep up conditioning.  Keep up ankle and leg flexibility.  Improve or keep muscle strength and endurance.  Improve cardiovascular fitness.  Use proper technique.  Use proper equipment (shoes, skates).  To help prevent recurrence, taping, protective strapping, or an adhesive bandage may be recommended for several weeks after healing is complete. PROGNOSIS   Recovery may take weeks to several months to  heal.  Longer recovery is expected if symptoms have been prolonged.  Recovery is usually quicker if the inflammation is due to a direct blow as compared with overuse or sudden strain. RELATED COMPLICATIONS   Healing time will be prolonged if the condition is not correctly treated. The injury must be given plenty of time to heal.  Symptoms can reoccur if activity is resumed too soon.  Untreated, tendinitis may increase the risk of tendon rupture requiring additional time for recovery and possibly surgery. TREATMENT   The first treatment consists of rest anti-inflammatory medication, and ice to relieve the pain.  Stretching and strengthening exercises after resolution of pain will likely help reduce the risk of recurrence. Referral to a physical therapist or athletic trainer for further evaluation and treatment may be helpful.  A walking boot or cast may be recommended to rest the Achilles tendon. This can help break the cycle of inflammation and microtrauma.  Arch supports (orthotics) may be prescribed or recommended by your caregiver as an adjunct to therapy and rest.  Surgery to remove the inflamed tendon lining or degenerated tendon tissue is rarely necessary and has shown less than predictable results. MEDICATION   Nonsteroidal anti-inflammatory medications, such as aspirin and ibuprofen, may be used for pain and inflammation relief. Do not take within 7 days before surgery. Take these as directed by your caregiver. Contact your caregiver immediately if any bleeding, stomach upset, or signs of allergic reaction occur. Other minor pain relievers, such as acetaminophen, may also be used.  Pain relievers may be prescribed as necessary by your caregiver. Do not take prescription pain medication for longer than 4 to  7 days. Use only as directed and only as much as you need.  Cortisone injections are rarely indicated. Cortisone injections may weaken tendons and predispose to rupture. It is  better to give the condition more time to heal than to use them. HEAT AND COLD  Cold is used to relieve pain and reduce inflammation for acute and chronic Achilles tendinitis. Cold should be applied for 10 to 15 minutes every 2 to 3 hours for inflammation and pain and immediately after any activity that aggravates your symptoms. Use ice packs or an ice massage.  Heat may be used before performing stretching and strengthening activities prescribed by your caregiver. Use a heat pack or a warm soak. SEEK MEDICAL CARE IF:  Symptoms get worse or do not improve in 2 weeks despite treatment.  New, unexplained symptoms develop. Drugs used in treatment may produce side effects.  EXERCISES:  RANGE OF MOTION (ROM) AND STRETCHING EXERCISES - Achilles Tendinitis  These exercises may help you when beginning to rehabilitate your injury. Your symptoms may resolve with or without further involvement from your physician, physical therapist or athletic trainer. While completing these exercises, remember:   Restoring tissue flexibility helps normal motion to return to the joints. This allows healthier, less painful movement and activity.  An effective stretch should be held for at least 30 seconds.  A stretch should never be painful. You should only feel a gentle lengthening or release in the stretched tissue.  STRETCH  Gastroc, Standing   Place hands on wall.  Extend right / left leg, keeping the front knee somewhat bent.  Slightly point your toes inward on your back foot.  Keeping your right / left heel on the floor and your knee straight, shift your weight toward the wall, not allowing your back to arch.  You should feel a gentle stretch in the right / left calf. Hold this position for 10 seconds. Repeat 3 times. Complete this stretch 2 times per day.  STRETCH  Soleus, Standing   Place hands on wall.  Extend right / left leg, keeping the other knee somewhat bent.  Slightly point your toes  inward on your back foot.  Keep your right / left heel on the floor, bend your back knee, and slightly shift your weight over the back leg so that you feel a gentle stretch deep in your back calf.  Hold this position for 10 seconds. Repeat 3 times. Complete this stretch 2 times per day.  STRETCH  Gastrocsoleus, Standing  Note: This exercise can place a lot of stress on your foot and ankle. Please complete this exercise only if specifically instructed by your caregiver.   Place the ball of your right / left foot on a step, keeping your other foot firmly on the same step.  Hold on to the wall or a rail for balance.  Slowly lift your other foot, allowing your body weight to press your heel down over the edge of the step.  You should feel a stretch in your right / left calf.  Hold this position for 10 seconds.  Repeat this exercise with a slight bend in your knee. Repeat 3 times. Complete this stretch 2 times per day.   STRENGTHENING EXERCISES - Achilles Tendinitis These exercises may help you when beginning to rehabilitate your injury. They may resolve your symptoms with or without further involvement from your physician, physical therapist or athletic trainer. While completing these exercises, remember:   Muscles can gain both the endurance and  the strength needed for everyday activities through controlled exercises.  Complete these exercises as instructed by your physician, physical therapist or athletic trainer. Progress the resistance and repetitions only as guided.  You may experience muscle soreness or fatigue, but the pain or discomfort you are trying to eliminate should never worsen during these exercises. If this pain does worsen, stop and make certain you are following the directions exactly. If the pain is still present after adjustments, discontinue the exercise until you can discuss the trouble with your clinician.  STRENGTH - Plantar-flexors   Sit with your right / left  leg extended. Holding onto both ends of a rubber exercise band/tubing, loop it around the ball of your foot. Keep a slight tension in the band.  Slowly push your toes away from you, pointing them downward.  Hold this position for 10 seconds. Return slowly, controlling the tension in the band/tubing. Repeat 3 times. Complete this exercise 2 times per day.   STRENGTH - Plantar-flexors   Stand with your feet shoulder width apart. Steady yourself with a wall or table using as little support as needed.  Keeping your weight evenly spread over the width of your feet, rise up on your toes.*  Hold this position for 10 seconds. Repeat 3 times. Complete this exercise 2 times per day.  *If this is too easy, shift your weight toward your right / left leg until you feel challenged. Ultimately, you may be asked to do this exercise with your right / left foot only.  STRENGTH  Plantar-flexors, Eccentric  Note: This exercise can place a lot of stress on your foot and ankle. Please complete this exercise only if specifically instructed by your caregiver.   Place the balls of your feet on a step. With your hands, use only enough support from a wall or rail to keep your balance.  Keep your knees straight and rise up on your toes.  Slowly shift your weight entirely to your right / left toes and pick up your opposite foot. Gently and with controlled movement, lower your weight through your right / left foot so that your heel drops below the level of the step. You will feel a slight stretch in the back of your calf at the end position.  Use the healthy leg to help rise up onto the balls of both feet, then lower weight only on the right / left leg again. Build up to 15 repetitions. Then progress to 3 consecutive sets of 15 repetitions.*  After completing the above exercise, complete the same exercise with a slight knee bend (about 30 degrees). Again, build up to 15 repetitions. Then progress to 3 consecutive sets  of 15 repetitions.* Perform this exercise 2 times per day.  *When you easily complete 3 sets of 15, your physician, physical therapist or athletic trainer may advise you to add resistance by wearing a backpack filled with additional weight.  STRENGTH - Plantar Flexors, Seated   Sit on a chair that allows your feet to rest flat on the ground. If necessary, sit at the edge of the chair.  Keeping your toes firmly on the ground, lift your right / left heel as far as you can without increasing any discomfort in your ankle. Repeat 3 times. Complete this exercise 2 times a day.

## 2021-03-18 ENCOUNTER — Telehealth: Payer: Self-pay | Admitting: *Deleted

## 2021-03-18 ENCOUNTER — Encounter: Payer: Self-pay | Admitting: Podiatry

## 2021-03-18 NOTE — Progress Notes (Signed)
  Subjective:  Patient ID: [place], male    DOB: May 14, 1956,  MRN: [id number]  Chief Complaint  Patient presents with  . Plantar Fasciitis    bilateral foot pain,  plantar fascitis    65 y.o. male presents with the above complaint. History confirmed with patient.  Referred by Dr. Prudence Davidson for treatment of right foot pain  Objective:  Physical Exam: warm, good capillary refill, no trophic changes or ulcerative lesions and normal DP and PT pulses.  There is pain elevation to the distal Achilles tendon at its insertion and slightly over the mid substance, especially in his equinus   Assessment:   1. Achilles tendinitis, right leg      Plan:  Patient was evaluated and treated and all questions answered.  Discussed the etiology and treatment options for Achilles tendinitis including stretching, formal physical therapy with an eccentric exercises therapy plan, supportive shoegears such as a running shoe or sneaker, heel lifts, topical and oral medications.  We also discussed that I do not routinely perform injections in this area because of the risk of an increased damage or rupture of the tendon.  We also discussed the role of surgical treatment of this for patients who do not improve after exhausting non-surgical treatment options.  -XR reviewed with patient -Educated on stretching and icing of the affected limb. -Referral placed to physical therapy.  Referral sent to [facility] PT -Has power steps continue wearing these have been helpful -OTC ibuprofen as needed -Consider x-rays and CAM boot if not improved by next visit  Return in about 6 weeks (around 04/28/2021) for re-check Achilles tendon.

## 2021-03-18 NOTE — Telephone Encounter (Signed)
Faxed referral to [facility] PT per request 03/18/21.

## 2021-04-14 NOTE — Progress Notes (Signed)
Follow Up Telehealth Note  04/14/2021    Today's visit was completed via a real-time telehealth encounter while the patient is in [place]. A telehealth visit was utilized in order to decrease the patient's potential exposure to COVID-19 vs an in-person visit. The patient/authorized person was informed of the potential benefits, limitations, and risks of telemedicine, expressed understanding that the laws that protect confidentiality also apply to telemedicine, and provided oral consent at the time of the visit to engaging in a telemedicine encounter with the present provider at [facility].     Telehealth Modality: Phone visit (audio only)  Total time spent in the clinical discussion 12 minutes.    Reason for Visit   Parkinson's.    Assessment   1) Parkinson's disease.  Stable.  We reviewed treatment concepts and options as well as prognosis.  Patient seems to be tolerating dose increase.  We will simply have him increase nighttime dose up to 300 mg next for the sake of simplicity.  Discussed transition to single 250 mg dose which she declined. Patient was able to appreciate benefit of Sinemet during previous taper attempt.  Instructed to call with any problematic symptoms including but not limited to GI discomfort, hallucinations.    2) Low back pain.  Unimproved.  Superimposed Left lumbar radiculopathy.  We reviewed treatment concepts and options as well as prognosis.  Has responded favorably to steroid in the past.  Discussed possibility of epidural steroid injection.  Patient is under the impression that his PCP will be arranging for a provider for management of his back pain. Last MRI L-spine 02/05/2020.    3) Neck stiffness.  May benefit from trial of PT.  Will call when ready to pursue.    4) Right Achilles tendinitis.  Stable.  Now linked in with podiatry.  We reviewed treatment concepts and options as well as diagnosis.      Next: PT, gabapentin, Cymbalta, Lyrica, ESI, amantadine, Azilect    Plan    1) RTC in 3 to 4 months  2) Sinemet titration from 300/300/200 to 300/300/300 mg as tolerated    ___________  L. Antonietta Barcelona, MD  Certifications in Neurology, Clinical Neurophysiology, Neuroimaging    HPI   Jacob Santiago is a 65 y.o. White or Caucasian male with a history of Parkinson's disease, LBP, NASH, neck stiffness.      Last visit patient was provided with a steroid injection without reported complication, did receive some benefit for both his heel and lower back    Patient diagnosed with Achilles tendinitis and recommended topical NSAID use along with PT, using patch/ionotopheresis    Seen by PA at [facility], and discussion of linkage for further management of his lower back discomfort    Increased his Sinemet 1st and 2nd dose to 3 tablets  No hallucinations, delusion  No GI issues  No recent dysphagia  Some improvement  No orthostasis  No impulse control issues    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      Wake up/Bedtime: 4-5AM, 9-10 PM  Mealtimes: bkfst 6, lunch 11:30 am, dinner 5-6:30PM  Med On/Off: denied  Adverse reactions: some fatigue questioned    Hallucinations/delusions: Denied  Dysphagia: Occasional  Impulse control issues:   Orthostasis:   Anosmia/Dysgeusia:  Memory changes:  Imbalance:   Falls: Denied  Hx of BPV    Last PT: June 2021  Last OT: denied  Last ST: denied  Last MBSS: denied  Dermatology evaluation: yearly, BCC    Last Brain imaging: 02/15/20  DaTScan: n/a    Med Hx: sinemet    Exam   There were no vitals taken for this visit.     PM/SH   No Known Allergies  Current Outpatient Medications   Medication Sig   . carbidopa-levodopa (SINEMET) 25-100 mg per tablet TAKE 2 TABLETS THREE TIMES DAILY   . carvediloL (COREG) 6.25 MG tablet     . co-enzyme Q-10 50 mg capsule Take 50 mg by mouth.   Francoise Schaumann no.41/Bifidobact no.7  (PROBIOTIC-10 ORAL) Probiotic   . losartan (COZAAR) 25 MG tablet Take 25 mg by mouth daily.   . melatonin 5 mg Tab tablet Take by mouth.      . omega 3-dha-epa-fish oil (FISH OIL) 1,000 mg (120 mg-180 mg) Cap 1 tablet   . pantoprazole (PROTONIX) 40 MG tablet pantoprazole 40 mg tablet,delayed release   . rosuvastatin (CRESTOR) 10 MG tablet Take 10 mg by mouth.   . tamsulosin (FLOMAX) 0.4 mg Cap capsule 1 capsule     Past Medical History:   Diagnosis Date   . Hearing loss    . Liver disease    . Sleep apnea      Past Surgical History:   Procedure Laterality Date   . CARPAL TUNNEL RELEASE     . HERNIA REPAIR       Family History   Problem Relation Age of Onset   . Parkinsonism Paternal Grandfather    . Anxiety disorder Neg Hx    . Ataxia Neg Hx    . Chorea Neg Hx    . Dementia Neg Hx    . Depression Neg Hx    . Intellectual Disability Neg Hx    . Migraines Neg Hx    . Neurofibromatosis Neg Hx    . Headache Neg Hx    . Multiple sclerosis Neg Hx    . Neuropathy Neg Hx    . Seizures Neg Hx    . Stroke Neg Hx      Social History     Tobacco History     Smoking Status  Never Smoker    Smokeless Tobacco Use  Never Used          Alcohol History     Alcohol Use Status  Not Currently          Drug Use     Drug Use Status  Never                DATA     No results found for any previous visit.     No results found for: WBC, HGB, HCT, PLT, CHOL, TRIG, HDL, LDLDIRECT, ALT, AST, NA, K, CL, CREATININE, BUN, CO2, TSH, PSA, INR, GLUF, HGBA1C, MICROALBUR    IMAGING DATA   No results found for this or any previous visit (from the past 72 hour(s)).  MR LUMBAR SPINE WO CONTRAST      Narrative    CLINICAL DATA: Trouble walking     EXAM:   MRI LUMBAR SPINE WITHOUT CONTRAST     TECHNIQUE:   Multiplanar, multisequence MR imaging of the lumbar spine was   performed. No intravenous contrast was administered.     COMPARISON: None.     FINDINGS:   Segmentation: Standard lumbar  numbering     Alignment: Mild dextrocurvature.     Vertebrae: No  fracture, evidence of discitis, or bone lesion.     Conus medullaris and cauda equina: Conus extends to the T12-L1   level. Conus and cauda equina appear normal.     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     Disc levels:     T12- L1: Unremarkable.     L1-L2: Disc narrowing and desiccation with left eccentric bulging.   No neural compression     L2-L3: Disc narrowing and bulging with endplate spurring. Negative   facets. No neural impingement     L3-L4: Disc narrowing and bulging with degenerative facet   hypertrophy. No neural impingement     L4-L5: Disc narrowing and bulging with a left inferior foraminal   protrusion. Mild degenerative facet spurring. Subarticular recess   narrowing without static compression. The foramina are patent     L5-S1:Degenerative facet spurring that is mild to moderate on the   right. No neural impingement     IMPRESSION:   1. Disc and facet degeneration with mild scoliosis.   2. No high-grade stenosis or neural compression to explain symptoms.       Electronically Signed   By: Marnee Spring M.D.   On: 02/05/2020 08:46    ____________________    Orders  No orders of the defined types were placed in this encounter.    No orders of the defined types were placed in this encounter.         Electronically signed by: Curt Bears, MD  04/15/21 [phone]

## 2021-05-01 ENCOUNTER — Other Ambulatory Visit: Payer: Self-pay

## 2021-05-01 ENCOUNTER — Ambulatory Visit (INDEPENDENT_AMBULATORY_CARE_PROVIDER_SITE_OTHER): Payer: Medicare Other | Admitting: Podiatry

## 2021-05-01 DIAGNOSIS — M7661 Achilles tendinitis, right leg: Secondary | ICD-10-CM | POA: Diagnosis not present

## 2021-05-01 DIAGNOSIS — M216X1 Other acquired deformities of right foot: Secondary | ICD-10-CM | POA: Diagnosis not present

## 2021-05-01 DIAGNOSIS — M62461 Contracture of muscle, right lower leg: Secondary | ICD-10-CM

## 2021-05-01 DIAGNOSIS — M21861 Other specified acquired deformities of right lower leg: Secondary | ICD-10-CM

## 2021-05-04 NOTE — Progress Notes (Signed)
  Subjective:  Patient ID: [place], male    DOB: 08/22/1956,  MRN: [id number]  Chief Complaint  Patient presents with   Tendonitis    Follow up Achilles tendonitis right - good days and bad days. PT is helping    65 y.o. male presents with the above complaint. History confirmed with patient.  Still doing physical therapy in [facility] which is helping.  He has good days and bad days.  Objective:  Physical Exam: warm, good capillary refill, no trophic changes or ulcerative lesions and normal DP and PT pulses.  There is pain elevation to the distal Achilles tendon at its insertion and slightly over the mid substance, continues to improve  Assessment:   1. 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  Discussed the etiology and treatment options for Achilles tendinitis including stretching, formal physical therapy with an eccentric exercises therapy plan, supportive shoegears such as a running shoe or sneaker, heel lifts, topical and oral medications.  We also discussed that I do not routinely perform injections in this area because of the risk of an increased damage or rupture of the tendon.  We also discussed the role of surgical treatment of this for patients who do not improve after exhausting non-surgical treatment options.  -XR reviewed with patient -Educated on stretching and icing of the affected limb. -Continue physical therapy.  Referral sent to [facility] PT -Has power steps continue wearing these have been helpful -OTC ibuprofen as needed -Consider x-rays and CAM boot if not improved by next visit  Return in about 6 weeks (around 06/12/2021) for re-check Achilles tendon.

## 2021-05-21 ENCOUNTER — Encounter: Payer: Self-pay | Admitting: Podiatry

## 2021-05-21 ENCOUNTER — Other Ambulatory Visit: Payer: Self-pay

## 2021-05-21 ENCOUNTER — Ambulatory Visit (INDEPENDENT_AMBULATORY_CARE_PROVIDER_SITE_OTHER): Payer: Medicare Other | Admitting: Podiatry

## 2021-05-21 DIAGNOSIS — M79675 Pain in left toe(s): Secondary | ICD-10-CM | POA: Diagnosis not present

## 2021-05-21 DIAGNOSIS — B351 Tinea unguium: Secondary | ICD-10-CM | POA: Diagnosis not present

## 2021-05-21 DIAGNOSIS — M7661 Achilles tendinitis, right leg: Secondary | ICD-10-CM

## 2021-05-21 DIAGNOSIS — E119 Type 2 diabetes mellitus without complications: Secondary | ICD-10-CM

## 2021-05-21 DIAGNOSIS — M79674 Pain in right toe(s): Secondary | ICD-10-CM | POA: Diagnosis not present

## 2021-05-21 NOTE — Progress Notes (Signed)
This patient returns to the office for evaluation and treatment of long thick painful nails .  This patient is unable to trim his own nails since the patient cannot reach his feet.  Patient says the nails are painful walking and wearing his shoes.  He returns for preventive foot care services.   General Appearance  Alert, conversant and in no acute stress.  Vascular  Dorsalis pedis and posterior tibial  pulses are palpable  bilaterally.  Capillary return is within normal limits  bilaterally. Temperature is within normal limits  bilaterally.  Neurologic  Senn-Weinstein monofilament wire test within normal limits  bilaterally. Muscle power within normal limits bilaterally.  Nails Thick disfigured discolored nails with subungual debris  from hallux to fifth toes bilaterally. No evidence of bacterial infection or drainage bilaterally.  Subungual hematoma sub 1,5 right foot.  Orthopedic  No limitations of motion  feet .  No crepitus or effusions noted.  No bony pathology or digital deformities noted. Retrocalcaneal spur right foot.  Skin  normotropic skin with no porokeratosis noted bilaterally.  No signs of infections or ulcers noted.     Onychomycosis  Pain in toes right foot  Pain in toes left foot  Debridement  of nails  1-5  B/L with a nail nipper.  Nails were then filed using a dremel tool with no incidents.    RTC 3 months   Gardiner Barefoot DPM

## 2021-06-12 ENCOUNTER — Ambulatory Visit (INDEPENDENT_AMBULATORY_CARE_PROVIDER_SITE_OTHER): Payer: Medicare Other | Admitting: Podiatry

## 2021-06-12 ENCOUNTER — Other Ambulatory Visit: Payer: Self-pay

## 2021-06-12 DIAGNOSIS — M7661 Achilles tendinitis, right leg: Secondary | ICD-10-CM

## 2021-06-12 DIAGNOSIS — M216X1 Other acquired deformities of right foot: Secondary | ICD-10-CM

## 2021-06-12 DIAGNOSIS — M21861 Other specified acquired deformities of right lower leg: Secondary | ICD-10-CM

## 2021-06-12 MED ORDER — METHYLPREDNISOLONE 4 MG PO TBPK: ORAL_TABLET | ORAL | 0 refills | Status: DC

## 2021-06-17 NOTE — Progress Notes (Signed)
  Subjective:  Patient ID: Mike Dunn, male    DOB: 12-Mar-1956,  MRN: [id number]  Chief Complaint  Patient presents with   Tendonitis      recheck achilles tendon right    65 y.o. male returns for follow-up with the above complaint. History confirmed with patient.  Overall improving but still not gone  Objective:  Physical Exam: warm, good capillary refill, no trophic changes or ulcerative lesions and normal DP and PT pulses.  There is pain on palpation to the distal Achilles tendon at its insertion and slightly over the mid substance, continues to improve  Assessment:   1. 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  Discussed the etiology and treatment options for Achilles tendinitis including stretching, formal physical therapy with an eccentric exercises therapy plan, supportive shoegears such as a running shoe or sneaker, heel lifts, topical and oral medications.  We also discussed that I do not routinely perform injections in this area because of the risk of an increased damage or rupture of the tendon.  We also discussed the role of surgical treatment of this for patients who do not improve after exhausting non-surgical treatment options.  -Overall has had improvement.  Still presenting with this to become a chronic issue.  Continues his home exercise plan Voltaren, I prescribed a methylprednisolone taper and put him into a CAM boot.  Wear the cam boot for about 4 weeks and then transition back to shoes.  Return in about 6 weeks (around 07/24/2021) for re-check Achilles tendon.

## 2021-06-20 IMAGING — US US ABDOMEN COMPLETE
1 series · 13 of 25 positions shown · non-contrast
Comparison: February 27, 2020

CLINICAL DATA: Cirrhosis of the liver.

EXAM:
ABDOMEN ULTRASOUND COMPLETE

[Series 1: us abdomen complete · 0.31mm/px · 13 of 92 slices shown]
[im 1/92]
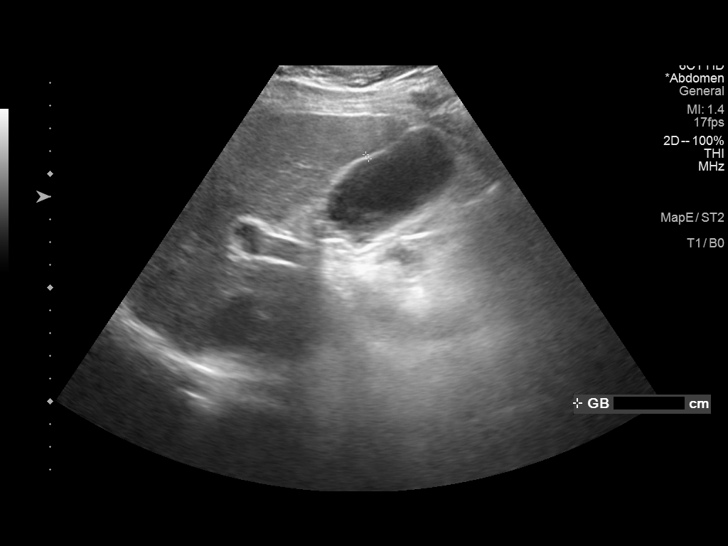
[im 8/92]
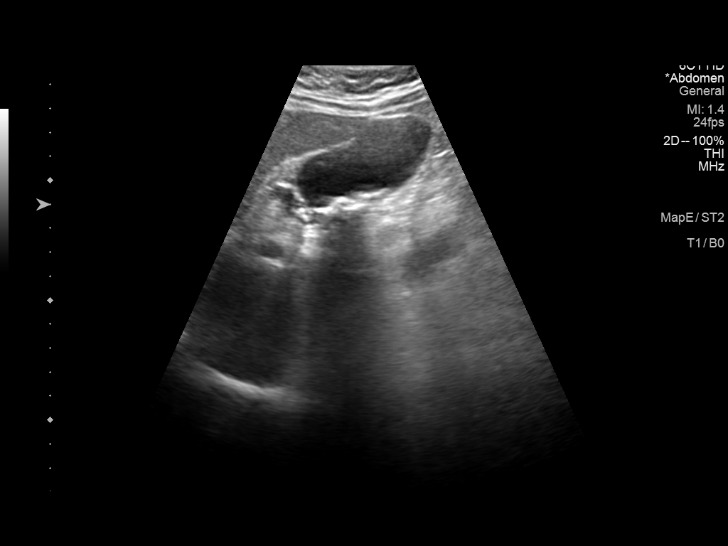
[im 16/92]
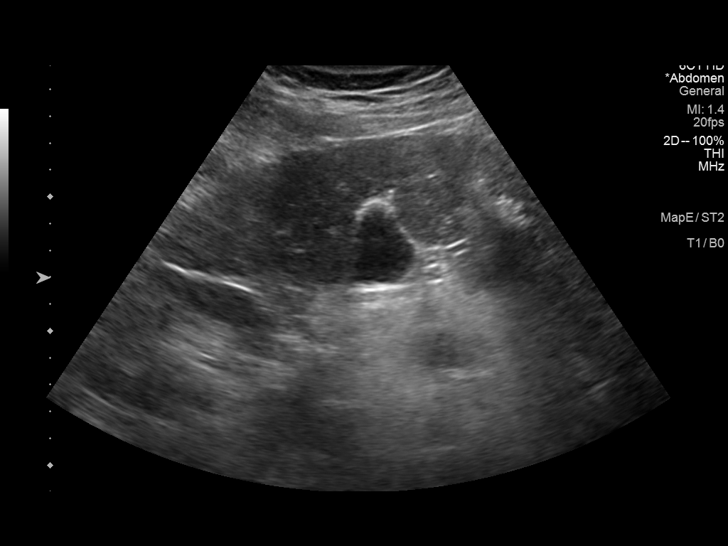
[im 23/92]
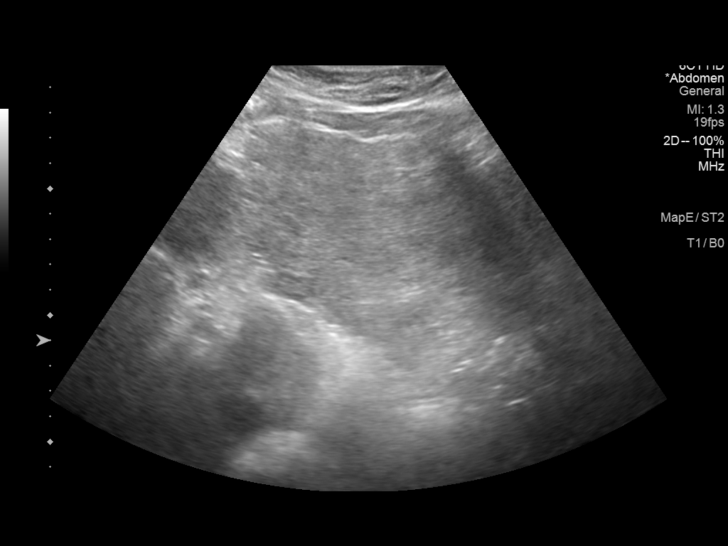
[im 31/92]
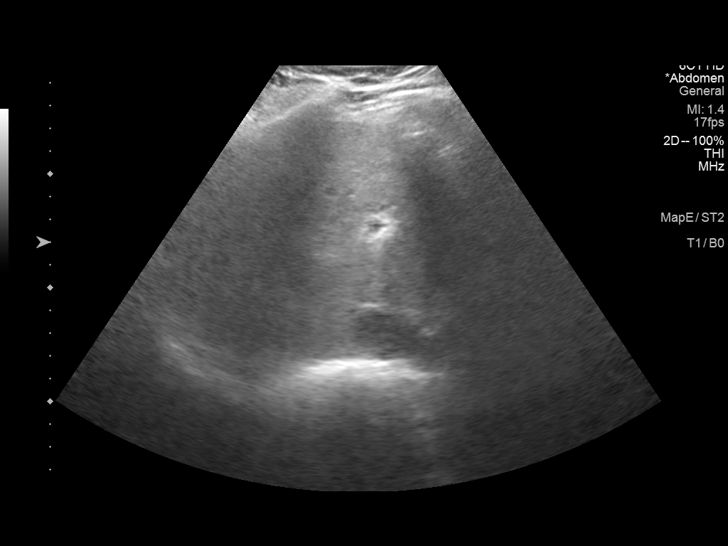
[im 38/92]
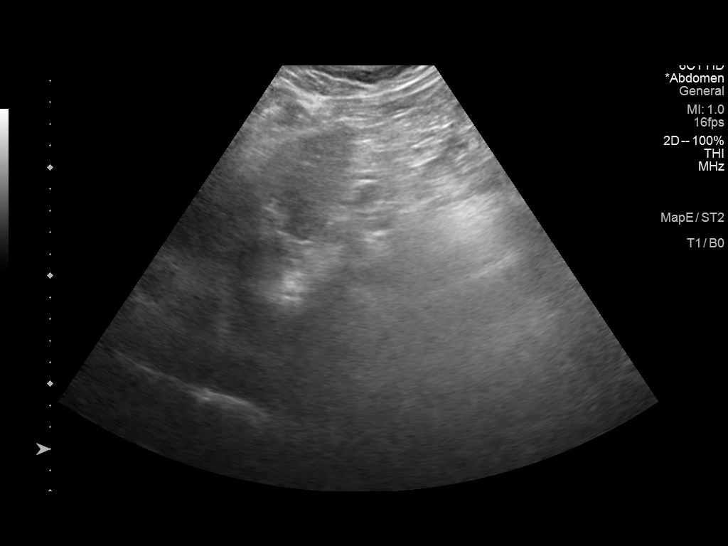
[im 46/92]
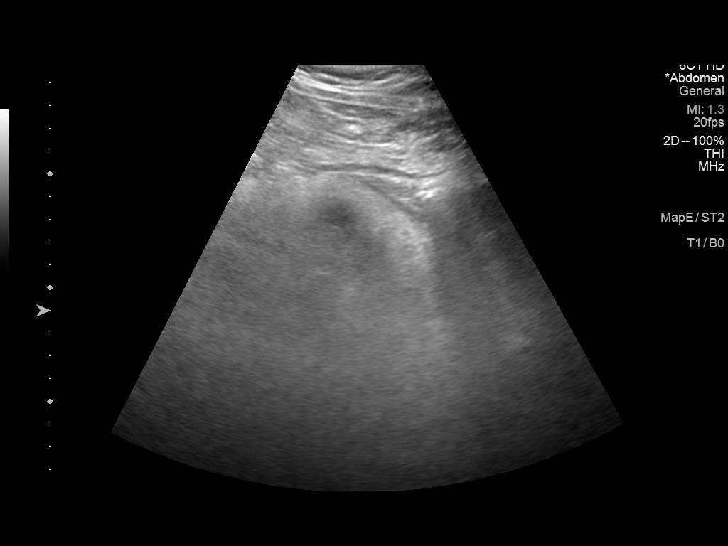
[im 54/92]
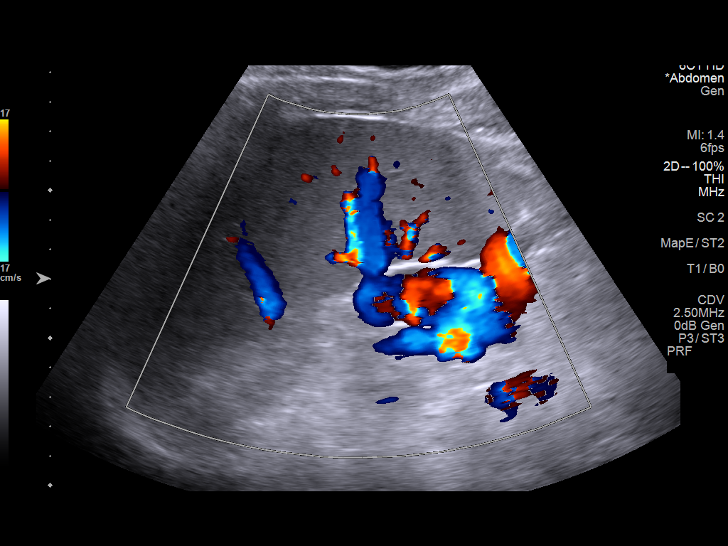
[im 61/92]
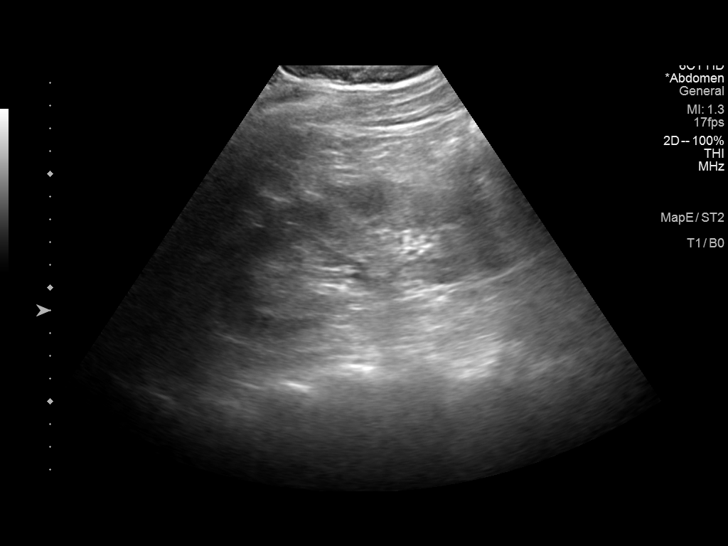
[im 69/92]
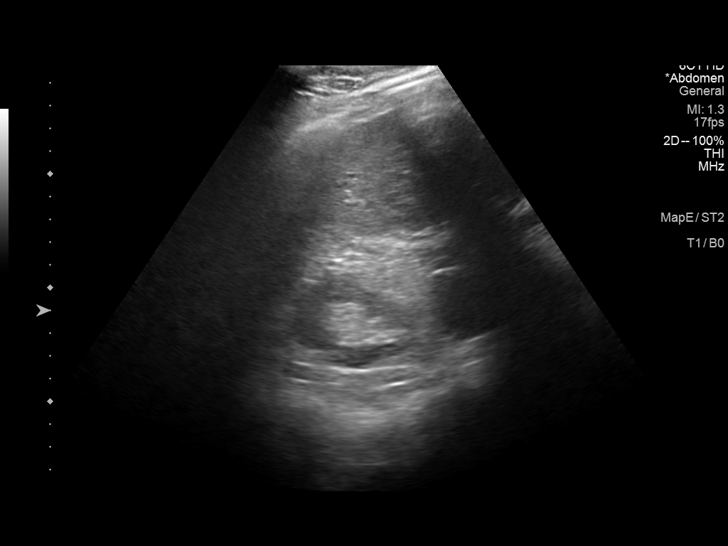
[im 76/92]
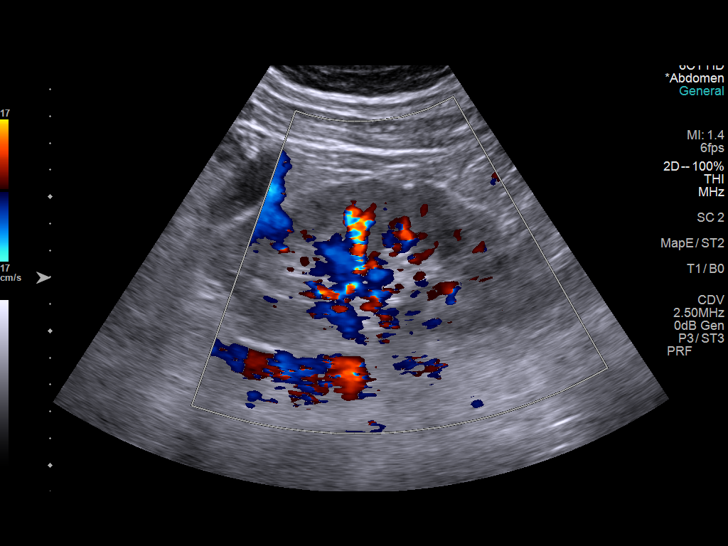
[im 84/92]
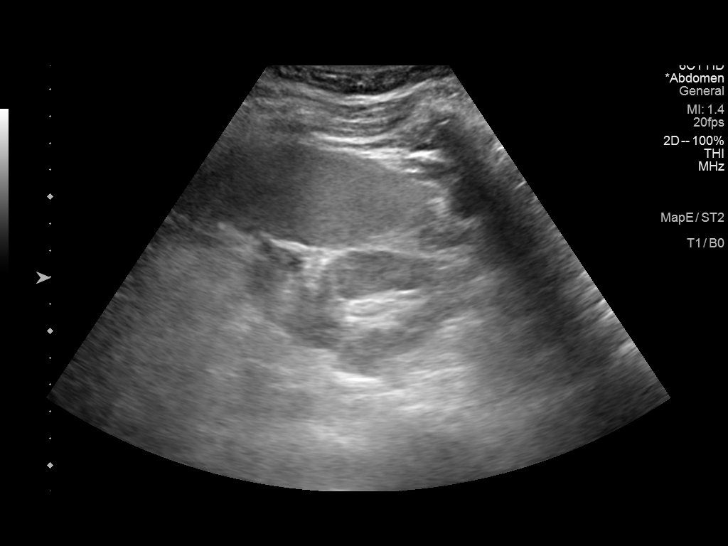
[im 92/92]
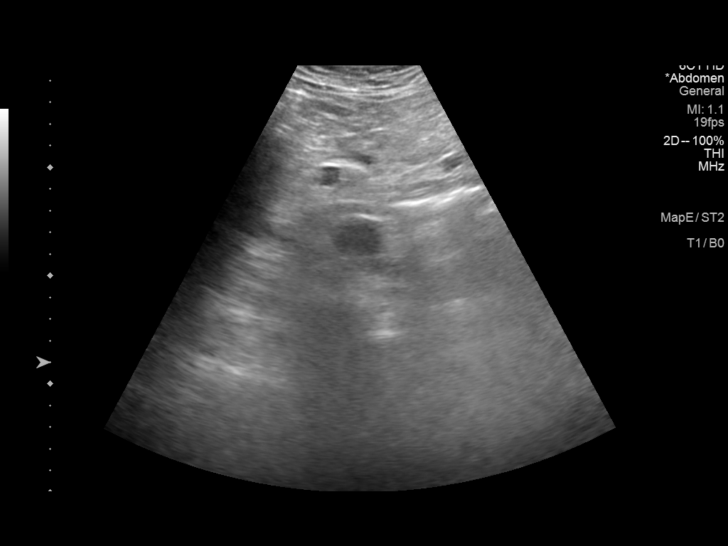

[13 of 25 positions shown; findings below may reference images not displayed]

FINDINGS: Gallbladder: Shadowing echogenic foci are seen in the dependent
portion of the gallbladder. The largest measures approximately
mm. There is no evidence of gallbladder wall thickening (2.3 mm). No
sonographic Murphy sign noted by sonographer.

Common bile duct: Diameter: 2.5 mm

Liver: No focal lesion identified. The liver parenchyma is
heterogeneously nodular in appearance and demonstrates diffusely
increased echogenicity. This is seen on the prior study. Portal vein
is patent on color Doppler imaging with normal direction of blood
flow towards the liver.

IVC: No abnormality visualized.

Pancreas: Visualized portion unremarkable.

Spleen: The spleen measures 12.4 cm in length and is otherwise
normal in appearance.

Right Kidney: Length: 13.6 cm. Echogenicity within normal limits. No
mass or hydronephrosis visualized.

Left Kidney: Length: 13.5 cm. Echogenicity within normal limits. No
mass or hydronephrosis visualized.

Abdominal aorta: No aneurysm visualized (2.5 cm in AP diameter).

Other findings: It should be noted that the study is limited
secondary to the patient's body habitus and overlying bowel gas.
IMPRESSION: 1. Cholelithiasis.
2. Cirrhotic liver with additional findings consistent with portal
hypertension.

## 2021-07-24 ENCOUNTER — Other Ambulatory Visit: Payer: Self-pay

## 2021-07-24 ENCOUNTER — Ambulatory Visit (INDEPENDENT_AMBULATORY_CARE_PROVIDER_SITE_OTHER): Payer: Medicare Other | Admitting: Podiatry

## 2021-07-24 DIAGNOSIS — M21861 Other specified acquired deformities of right lower leg: Secondary | ICD-10-CM

## 2021-07-24 DIAGNOSIS — M216X1 Other acquired deformities of right foot: Secondary | ICD-10-CM | POA: Diagnosis not present

## 2021-07-24 DIAGNOSIS — M7661 Achilles tendinitis, right leg: Secondary | ICD-10-CM

## 2021-07-24 NOTE — Progress Notes (Signed)
  Subjective:  Patient ID: Mike Dunn, male    DOB: 05-23-1956,  MRN: [id number]  Chief Complaint  Patient presents with   Tendonitis    Follow up achilles tendonitis right    65 y.o. male returns for follow-up with the above complaint. History confirmed with patient.  Doing much better since about 8090% better he has been in the boot in the methylprednisolone taper is helpful as well  Objective:  Physical Exam: warm, good capillary refill, no trophic changes or ulcerative lesions and normal DP and PT pulses.  There is minimal pain on palpation to the distal Achilles tendon at its insertion and slightly over the mid substance, continues to improve  Assessment:   1. 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  Doing very well and nearly fully healed.  I think he can continue his home therapy exercise plan and use regular shoe gear with heel lifts which I dispensed.  Recommend he go to twice daily exercises for therapy plan and hopefully in another month or so which should be completely resolved.  Return to see me as needed for this or other issues  Return if symptoms worsen or fail to improve.

## 2021-08-19 ENCOUNTER — Telehealth: Payer: Self-pay | Admitting: Adult Health

## 2021-08-19 NOTE — Telephone Encounter (Signed)
..   Pt understands that although there may be some limitations with this type of visit, we will take all precautions to reduce any security or privacy concerns.  Pt understands that this will be treated like an in office visit and we will file with pt's insurance, and there may be a patient responsible charge related to this service. ? ?

## 2021-08-21 NOTE — Progress Notes (Signed)
PATIENT: Barbette Reichmann DOB: August 22, 1956  REASON FOR VISIT: follow up HISTORY FROM: patient  Virtual Visit via Video Note  I connected with Barbette Reichmann on 08/25/21 at  9:00 AM EDT by a video enabled telemedicine application located remotely at [facility] and verified that I am speaking with the correct person using two identifiers who was located at their own home.   I discussed the limitations of evaluation and management by telemedicine and the availability of in person appointments. The patient expressed understanding and agreed to proceed.   PATIENT: Barbette Reichmann DOB: 1956/01/05  REASON FOR VISIT: follow up HISTORY FROM: patient  HISTORY OF PRESENT ILLNESS: Today 08/25/21:  Mr. 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  He returns today for follow-up.  He reports that the CPAP is working well for him.  He denies any new issues.  He states that he was waking up around 2 AM but since he stopped taking melatonin that issue has resolved.  He returns today for virtual visit    HISTORY 08/22/20:  Mr. Markus is a 65 year old male with a history of obstructive sleep apnea on CPAP.  His download indicates that he uses machine nightly for compliance of 100%.  He uses machine greater than 4 hours each night.  On average he uses his machine 7 hours and 16 minutes.  His residual AHI is 0.6 on 8 cm of water with EPR 3.  Leak in the 95th percentile is 24.3 L/min.  He states that he is not been able to get new supplies because of his insurance.  HISTORY 08/22/2019: I reviewed his CPAP compliance data from 07/22/2019 through 08/20/2019 which is a total of 30 days, during which time he used his CPAP every night with percent use days greater than 4 hours at 100%, indicating superb compliance with an average usage of 8 hours and 5 minutes, residual AHI 0.6/h, at goal, leak on the higher side with a 95th percentile at 29 L/min on a pressure  of 8 cm with EPR of 2.  He reports doing well with his CPAP, sometimes he has to tighten the mask too much and it leaves an imprint on his face.  He uses a traditionally shaped fullface mask, generally tolerates it well.  He just got new supplies.  He retired on 08/02/2019 and will have insurance coverage till the end of this month and then [employer] coverage through the end of this year, then he will switch over to [employer].  REVIEW OF SYSTEMS: Out of a complete 14 system review of symptoms, the patient complains only of the following symptoms, and all other reviewed systems are negative.  ALLERGIES: Allergies  Allergen Reactions   Tamsulosin    Zocor [Simvastatin] Other (See Comments)    Caused numbness    HOME MEDICATIONS: Outpatient Medications Prior to Visit  Medication Sig Dispense Refill   carbidopa-levodopa (SINEMET IR) 25-100 MG tablet      carvedilol (COREG) 6.25 MG tablet      co-enzyme Q-10 50 MG capsule Take 50 mg by mouth daily.     CORGARD 40 MG tablet Take 40 mg daily by mouth.  4   fenofibrate 160 MG tablet Take 160 mg daily by mouth.     ferrous sulfate 325 (65 FE) MG tablet Take 325 mg daily with breakfast by mouth.     fish oil-omega-3 fatty acids 1000 MG capsule Take 1 g  by mouth daily.     losartan (COZAAR) 25 MG tablet Take by mouth.     Melatonin 10 MG CAPS Take by mouth.     methylPREDNISolone (MEDROL DOSEPAK) 4 MG TBPK tablet 6 day dose pack - take as directed 21 tablet 0   Multiple Vitamins-Minerals (MULTI FOR HIM 50+ PO) Take 1 tablet by mouth daily.     pantoprazole (PROTONIX) 40 MG tablet Take 40 mg by mouth daily.     Probiotic Product (ALIGN) 4 MG CAPS Take 4 mg by mouth daily.     rosuvastatin (CRESTOR) 10 MG tablet Take 10 mg by mouth daily.     tamsulosin (FLOMAX) 0.4 MG CAPS capsule Take 0.4 mg by mouth daily.     traMADol (ULTRAM) 50 MG tablet one tablet     No facility-administered medications prior to visit.    PAST MEDICAL HISTORY: Past Medical  History:  Diagnosis Date   BPH (benign prostatic hyperplasia)    Cancer (HCC)    Cirrhosis, nonalcoholic (HCC)    Diabetes mellitus without complication (HCC)    Hyperlipidemia    Obesity    Prostatitis, chronic    Spermatocele of epididymis 2014    PAST SURGICAL HISTORY: Past Surgical History:  Procedure Laterality Date   BASAL CELL CARCINOMA EXCISION     CARPAL TUNNEL RELEASE     HERNIA REPAIR     ventral and inguinal    MOUTH SURGERY     TONSILLECTOMY      FAMILY HISTORY: Family History  Problem Relation Age of Onset   Hypertension Mother    Heart disease Father    Nephrolithiasis Brother     SOCIAL HISTORY: Social History   Socioeconomic History   Marital status: Married    Spouse name: Not on file   Number of children: Not on file   Years of education: Not on file   Highest education level: Not on file  Occupational History   Not on file  Tobacco Use   Smoking status: Never   Smokeless tobacco: Never  Substance and Sexual Activity   Alcohol use: No   Drug use: No   Sexual activity: Never  Other Topics Concern   Not on file  Social History Narrative   Not on file   Social Determinants of Health   Financial Resource Strain: Not on file  Food Insecurity: Not on file  Transportation Needs: Not on file  Physical Activity: Not on file  Stress: Not on file  Social Connections: Not on file  Intimate Partner Violence: Not on file      PHYSICAL EXAM Generalized: Well developed, in no acute distress   Neurological examination  Mentation: Alert oriented to time, place, history taking. Follows all commands speech and language fluent Cranial nerve II-XII:Extraocular movements were full. Facial symmetry noted. uvula tongue midline. Head turning and shoulder shrug  were normal and symmetric. Motor: Good strength throughout subjectively per patient Sensory: Sensory testing is intact to soft touch on all 4 extremities subjectively per  patient Coordination: Cerebellar testing reveals good finger-nose-finger  Gait and station: Patient is able to stand from a seated position. gait is normal.  Reflexes: UTA  DIAGNOSTIC DATA (LABS, IMAGING, TESTING) - I reviewed patient records, labs, notes, testing and imaging myself where available.  Lab Results  Component Value Date   WBC 5.9 02/28/2018   HGB 14.9 02/28/2018   HCT 42.1 02/28/2018   MCV 96.8 02/28/2018   PLT 70 (L) 02/28/2018  Component Value Date/Time   NA 136 02/10/2018 0100   NA 140 08/01/2015 1208   K 3.6 02/10/2018 0100   K 3.9 08/01/2015 1208   CL 104 02/10/2018 0100   CO2 22 02/10/2018 0100   CO2 23 08/01/2015 1208   GLUCOSE 138 (H) 02/10/2018 0100   GLUCOSE 112 08/01/2015 1208   BUN 15 02/10/2018 0100   BUN 13.0 08/01/2015 1208   CREATININE 0.60 (L) 02/10/2018 0100   CREATININE 0.8 08/01/2015 1208   CALCIUM 8.5 (L) 02/10/2018 0100   CALCIUM 9.8 08/01/2015 1208   PROT 7.6 08/01/2015 1208   ALBUMIN 4.2 08/01/2015 1208   AST 51 (H) 08/01/2015 1208   ALT 35 08/01/2015 1208   ALKPHOS 55 08/01/2015 1208   BILITOT 1.37 (H) 08/01/2015 1208   GFRNONAA >60 02/10/2018 0100   GFRAA >60 02/10/2018 0100   Lab Results  Component Value Date   CHOL 163 05/09/2011   HDL 38 (L) 05/09/2011   LDLCALC 90 05/09/2011   TRIG 173 (H) 05/09/2011   CHOLHDL 4.3 05/09/2011   Lab Results  Component Value Date   HGBA1C 6.2 (H) 05/10/2011   No results found for: [id number] Lab Results  Component Value Date   TSH 1.987 05/09/2011      ASSESSMENT AND PLAN 65 y.o. year old male  has a past medical history of BPH (benign prostatic hyperplasia), Cancer ([place]), Cirrhosis, nonalcoholic ([place]), Diabetes mellitus without complication ([place]), Hyperlipidemia, Obesity, Prostatitis, chronic, and Spermatocele of epididymis (2014). here with:   OSA on cpap  CPAP compliance is excellent Good treatment of his apnea Encourage patient to continue using CPAP nightly and  greater than 4 hours each night Follow-up in 1 year or sooner if needed     Ward Givens, MSN, NP-C 08/25/2021, 8:40 AM [facility] [address] [place], [place] [address] [phone]

## 2021-08-25 ENCOUNTER — Telehealth (INDEPENDENT_AMBULATORY_CARE_PROVIDER_SITE_OTHER): Payer: Medicare Other | Admitting: Adult Health

## 2021-08-25 DIAGNOSIS — G4733 Obstructive sleep apnea (adult) (pediatric): Secondary | ICD-10-CM

## 2021-08-25 DIAGNOSIS — Z9989 Dependence on other enabling machines and devices: Secondary | ICD-10-CM

## 2021-08-27 ENCOUNTER — Encounter: Payer: Self-pay | Admitting: Podiatry

## 2021-08-27 ENCOUNTER — Other Ambulatory Visit: Payer: Self-pay

## 2021-08-27 ENCOUNTER — Ambulatory Visit (INDEPENDENT_AMBULATORY_CARE_PROVIDER_SITE_OTHER): Payer: Medicare Other | Admitting: Podiatry

## 2021-08-27 DIAGNOSIS — E119 Type 2 diabetes mellitus without complications: Secondary | ICD-10-CM | POA: Diagnosis not present

## 2021-08-27 DIAGNOSIS — M79674 Pain in right toe(s): Secondary | ICD-10-CM | POA: Diagnosis not present

## 2021-08-27 DIAGNOSIS — B351 Tinea unguium: Secondary | ICD-10-CM

## 2021-08-27 DIAGNOSIS — M79675 Pain in left toe(s): Secondary | ICD-10-CM

## 2021-08-27 NOTE — Progress Notes (Signed)
This patient returns to the office for evaluation and treatment of long thick painful nails .  This patient is unable to trim his own nails since the patient cannot reach his feet.  Patient says the nails are painful walking and wearing his shoes.  He returns for preventive foot care services.   General Appearance  Alert, conversant and in no acute stress.  Vascular  Dorsalis pedis and posterior tibial  pulses are palpable  bilaterally.  Capillary return is within normal limits  bilaterally. Temperature is within normal limits  bilaterally.  Neurologic  Senn-Weinstein monofilament wire test within normal limits  bilaterally. Muscle power within normal limits bilaterally.  Nails Thick disfigured discolored nails with subungual debris  from hallux to fifth toes bilaterally. No evidence of bacterial infection or drainage bilaterally.  Subungual hematoma sub 1,5 right foot.  Orthopedic  No limitations of motion  feet .  No crepitus or effusions noted.  No bony pathology or digital deformities noted. Retrocalcaneal spur right foot.  Skin  normotropic skin with no porokeratosis noted bilaterally.  No signs of infections or ulcers noted.     Onychomycosis  Pain in toes right foot  Pain in toes left foot  Debridement  of nails  1-5  B/L with a nail nipper.  Nails were then filed using a dremel tool with no incidents.    RTC 3 months   Gardiner Barefoot DPM

## 2021-09-05 ENCOUNTER — Encounter: Payer: Self-pay | Admitting: Neurology

## 2021-09-26 NOTE — Progress Notes (Signed)
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  Discussed community resources and met with LCSW today  2.  OSAS  -is followed by Dr. Rexene Alberts.  On cpap  3.  Hx of vasovagal syncope  -this was remote and last episode 8-10 years ago  4.  Dysphagia  -Patient had a barium swallow (not modified barium swallow) in May, 2022.  This just demonstrated mild esophageal dysmotility, as well as evidence of known esophageal varices. He sees GI  5.  Hx of BCC -We discussed that it used to be thought that levodopa would increase risk of melanoma but now it is believed that Parkinsons itself likely increases risk of melanoma. he is to get regular skin checks.  He does that regularly due to hx of multiple BCC  6.  Parkinsons Disease dyskinesia  -mild and not noticeable to the patient  7.  Possible Parkinsons Disease related hallucinations  -will monitor and spread out levodopa differently  8.  Pt planning to move to [facility] but wants to f/u here until then.    Subjective:   Mike Dunn was seen today in the movement disorders clinic for neurologic consultation at the request of Antony Contras, MD.  The consultation is for the evaluation of Parkinson's disease.  This patient is accompanied in the office by his spouse who supplements the history.Patient has been seen by [facility] neurology in [facility] (Dr. Kris Mouton and Dr. Everette Rank) and also [facility] neurology, including Dr. Rexene Alberts.  Medical records made available to me are reviewed.  Patient started seeing Dr. Rexene Alberts in 2018 for sleep apnea.  At that point in time, she noted that the patient had no tremor on examination.  The same was true on her  examination in October, 2020.  She also noted that "gait shows normal stride length and normal pace.  Posture is age-appropriate and stance is narrow based."  Patient sought consultation in [facility] in April, 2021 for slow movements, intermittent tremor and balance change that had been going on for 1.5 years, with tremor of the left hand perhaps dating back 2 years.  Dr. Kris Mouton felt that the patient likely had Parkinson's disease.  Levodopa was initiated.  She did an MRI at April, 2021.  This was reported to show mild small vessel disease and atrophy.  Those films are not available.  Patient followed up in June, 2021 and his levodopa was changed from 1 tablet 3 times per day to 2 tablets 3 times per day.  Patient was last seen by Dr. Everette Rank on April 14, 2021.  At that point in time he was told to take carbidopa/levodopa 25/100, 3 tablets in the morning, 3 tablets at noon and 2 tablets in the evening.  Pt states that he is currently taking carbidopa/levodopa 25/100, 3 po tid (6am/noon/5pm).  He can tell when the medication wears off.  He will have more trouble walking with freezing.  It lasts until dosing time but if he is late, he will notice re-emergence of symptoms.  Feet/legs cramp at night.  Hard for him to walk at middle of the night to use the RR (wonders if from Parkinsons Disease or back issues)  Specific Symptoms:  Tremor: not a lot  Family hx of similar:  Yes.  , paternal GF and paternal uncle Voice: gotten soft/weak - has not done Parkinsons Disease voice therapy Sleep: sleeping better - wife moved to a different room  Vivid Dreams:  Yes.    Acting out dreams:  some active dreams Wet Pillows: No. Postural symptoms:  Yes.    Falls?  Near falls Bradykinesia symptoms: shuffling gait, slow movements, drooling while awake, and difficulty getting out of a chair Loss of smell:  No. Loss of taste:  No. Difficulty Swallowing:  Yes.  Patient had a barium swallow (not modified barium swallow) in  May, 2022.  This just demonstrated mild esophageal dysmotility, as well as evidence of known esophageal varices. He sees GI Handwriting, micrographia: Yes.  , but is better than it was Trouble with ADL's:  no  Trouble buttoning clothing: Yes.   Depression:  some and some anxiety Memory changes:  Yes.  , some - noted this AM that he left the stove on. Hallucinations:  Yes.  , sees cats but thinks that its due to fact that his died.  That has been going on a few months.  Will sometimes see his father in evening (died) N/V:  No. Lightheaded:  Yes.    Syncope: No. (None for many years - hx of vasovagal syncope) Diplopia:  No. Dyskinesia:  Yes.   Per wife; no per patient    PREVIOUS MEDICATIONS: Sinemet  ALLERGIES:   Allergies  Allergen Reactions   Tamsulosin    Zocor [Simvastatin] Other (See Comments)    Caused numbness    CURRENT MEDICATIONS:  Current Outpatient Medications  Medication Instructions   Align 4 mg, Oral, Daily   carbidopa-levodopa (SINEMET IR) 25-100 MG tablet 3 tablets, 3 times daily   carvedilol (COREG) 6.25 MG tablet No dose, route, or frequency recorded.   co-enzyme Q-10 50 mg, Oral, Daily,     Corgard 40 mg, Oral, Daily   fenofibrate 160 mg, Oral, Daily   ferrous sulfate 325 mg, Oral, Daily with breakfast   fish oil-omega-3 fatty acids 1 g, Oral, Daily,     losartan (COZAAR) 25 MG tablet Oral   Multiple Vitamins-Minerals (MULTI FOR HIM 50+ PO) 1 tablet, Oral, Daily,     pantoprazole (PROTONIX) 40 mg, Oral, Daily   rosuvastatin (CRESTOR) 10 mg, Oral, Daily,     tamsulosin (FLOMAX) 0.4 mg, Oral, Daily    Objective:   VITALS:   Vitals:   09/30/21 0840  BP: 116/72  Pulse: 80  SpO2: 96%  Weight: 215 lb (97.5 kg)  Height: _0  (1.727 m)    GEN:  The patient appears stated age and is in NAD. HEENT:  Normocephalic, atraumatic.  The mucous membranes are moist. The superficial temporal arteries are without ropiness or tenderness. CV:  RRR Lungs:   CTAB Neck/HEME:  There are no carotid bruits bilaterally.  Neurological examination:  Orientation: The patient is alert and oriented x3.  Cranial nerves: There is good facial symmetry. There is facial hypomimia.  Extraocular muscles are intact. The visual fields are full to confrontational testing. The speech is fluent and clear. Soft palate rises symmetrically and there is no tongue deviation. Hearing is intact to conversational tone. Sensation: Sensation is intact to light and pinprick throughout (facial, trunk, extremities). Vibration is intact at the bilateral big toe. There is no extinction with double simultaneous stimulation. There is no sensory dermatomal level identified. Motor: Strength is 5/5 in the  bilateral upper and lower extremities.   Shoulder shrug is equal and symmetric.  There is no pronator drift. Deep tendon reflexes: Deep tendon reflexes are 0-1/4 at the bilateral biceps, triceps, brachioradialis, patella and achilles. Plantar responses are downgoing bilaterally.  Movement examination: Tone: There is nltone in the bilateral upper extremities.  The tone in the lower extremities is nl.  Abnormal movements: there is dyskinesia, mild, in the R shoulder; no tremor even with distraction Coordination:  There is mild decremation with RAM's, mostly with toe taps on the L Gait and Station: The patient has mild difficulty arising out of a deep-seated chair without the use of the hands (able to do it on 2nd attempt). The patient's stride length is decreased.  The patient has a neg pull test.     I have reviewed and interpreted the following labs independently   Chemistry      Component Value Date/Time   NA 136 02/10/2018 0100   NA 140 08/01/2015 1208   K 3.6 02/10/2018 0100   K 3.9 08/01/2015 1208   CL 104 02/10/2018 0100   CO2 22 02/10/2018 0100   CO2 23 08/01/2015 1208   BUN 15 02/10/2018 0100   BUN 13.0 08/01/2015 1208   CREATININE 0.60 (L) 02/10/2018 0100   CREATININE 0.8  08/01/2015 1208      Component Value Date/Time   CALCIUM 8.5 (L) 02/10/2018 0100   CALCIUM 9.8 08/01/2015 1208   ALKPHOS 55 08/01/2015 1208   AST 51 (H) 08/01/2015 1208   ALT 35 08/01/2015 1208   BILITOT 1.37 (H) 08/01/2015 1208      Lab Results  Component Value Date   TSH 1.987 05/09/2011   Lab Results  Component Value Date   WBC 5.9 02/28/2018   HGB 14.9 02/28/2018   HCT 42.1 02/28/2018   MCV 96.8 02/28/2018   PLT 70 (L) 02/28/2018     Total time spent on today's visit was 80 minutes, including both face-to-face time and nonface-to-face time.  Time included that spent on review of records (prior notes available to me/labs/imaging if pertinent), discussing treatment and goals, answering patient's questions and coordinating care.  Cc:  Antony Contras, MD

## 2021-09-30 ENCOUNTER — Other Ambulatory Visit: Payer: Self-pay

## 2021-09-30 ENCOUNTER — Ambulatory Visit (INDEPENDENT_AMBULATORY_CARE_PROVIDER_SITE_OTHER): Payer: Medicare Other | Admitting: Neurology

## 2021-09-30 ENCOUNTER — Encounter: Payer: Self-pay | Admitting: Neurology

## 2021-09-30 VITALS — BP 116/72 | HR 80 | Ht 68.0 in | Wt 215.0 lb

## 2021-09-30 DIAGNOSIS — G249 Dystonia, unspecified: Secondary | ICD-10-CM

## 2021-09-30 DIAGNOSIS — G20 Parkinson's disease: Secondary | ICD-10-CM

## 2021-09-30 DIAGNOSIS — R1319 Other dysphagia: Secondary | ICD-10-CM

## 2021-09-30 MED ORDER — CARBIDOPA-LEVODOPA ER 50-200 MG PO TBCR
1.0000 | EXTENDED_RELEASE_TABLET | Freq: Every day | ORAL | 1 refills | Status: DC
Start: 2021-09-30 — End: 2022-01-12

## 2021-09-30 MED ORDER — CARBIDOPA-LEVODOPA 25-100 MG PO TABS: 2.0000 | ORAL_TABLET | Freq: Four times a day (QID) | ORAL | 1 refills | Status: DC

## 2021-09-30 NOTE — Patient Instructions (Addendum)
Take carbidopa/levodopa 25/100, 2 tablets at 6am/10am/2pm/6pm Add carbidopa/levodopa 50/200 CR at bedtime  Online Resources for Power over Parkinson's Group November 2022  Local [place] Online Groups  Power over [employer] Group :   Power Over Parkinson's Patient Education Group will be Wednesday, November 9th-*Hybrid meting*- in person at [place] location and via [facility] at 2:00 pm.   Upcoming [employer] over [employer] Meetings:  2nd Wednesdays of the month at 2 pm:  November 9th, December 14th [place] at [email or web address] if interested in participating in this online group Parkinson's Care Partners Group:    3rd Mondays, Contact Misty Paladino Atypical Parkinsonian Patient Group:   4th Wednesdays, [place] If you are interested in participating in these online groups with Misty, please contact her directly for how to join those meetings.  Her contact information is [email or web address].   [place]:  www.[email or web address] PD Health at Home continues:  Mindfulness Mondays, Expert Briefing Tuesdays, Wellness Wednesdays, Take Time Thursdays, Fitness Fridays -Listings for June 2022 are on the website Upcoming Webinar:  Expert Briefing:  Let's Talk about Dementia.  Wednesday, November 2nd  at 1 pm. Register for Armed forces operational officer) at [email or web address]  Please check out their website to sign up for emails and see their full online offerings  [place]:  [email or web address]  Upcoming Webinar:   2022 in Review:  Progress Toward Better Treatment and Prevention.  Thursday, November 17th at 12 noon Check out additional information on their website to see their full online offerings  [place]:  [email or web address] Upcoming Webinar:  NOH (Neurogenic Orthostatic Hypotension):  What it is, How to Know if you Have  it, and How to Manage it.  Tuesday, November 15th at 3 pm.  Webinar Series:  Living with Parkinson's Meetup.   Third Thursdays of each month at 3 pm; next one is November 17th Care Partner Monthly Meetup.  With Robin Searing Phinney.  First Tuesday of each month, 2 pm Joy Breaks:  First Wednesday of each month, 2-3 pm. There will be art, doodling, making, crafting, listening, laughing, stories, and everything in between. No art experience necessary. No supplies required. Just show up for joy!  Register on their website. Check out additional information to Live Well Today on their website  Parkinson and Movement Disorders (PMD) Alliance:  www.pmdalliance.org NeuroLife Online:  Online Education Events Sign up for emails, which are sent weekly to give you updates on programming and online offerings  Parkinson's Association of the [place]:  www.parkinsonassociation.org Information on online support groups, education events, and online exercises including Yoga, Parkinson's exercises and more-LOTS of information on links to PD resources and online events Virtual Support Group through Parkinson's Association of the [place]; next one is scheduled for Wednesday, November 2nd  at 2 pm.  (These are typically scheduled for the 1st Wednesday of the month at 2 pm).  Visit website for details.  Additional links for movement activities: Parkinson's DRUMMING Classes/Music Therapy with Doylene Canning:  This is a returning class and it's FREE!  2nd Mondays, continuing November 14th.  Contact *Misty Taylor-Paladino at [email or web address] or Doylene Canning at [phone] or [email or web address]  PWR! Moves Classes at [place].  Wednesdays 10 and 11 am.  Contact Amy Marriott, PT [email or web address] if interested. Here is a link to the PWR!Moves classes on Zoom from [place] - Daily Mon-Sat at 10:00. Via Zoom, FREE and open to all.  There is  also a link below via Facebook if you  use that platform. [email or web address] Parkinson's Wellness Recovery (PWR! Moves)  www.pwr4life.org Info on the PWR! Virtual Experience:  You will have access to our expertise through self-assessment, guided plans that start with the PD-specific fundamentals, educational content, tips, Q&A with an expert, and a growing Art therapist of PD-specific pre-recorded and live exercise classes of varying types and intensity - both physical and cognitive! If that is not enough, we offer 1:1 wellness consultations (in-person or virtual) to personalize your PWR! Research scientist (medical).  [place] Fridays:  As part of the PD Health @ Home program, this free video series focuses each week on one aspect of fitness designed to support people living with Parkinson's.  These weekly videos highlight the [place] recent fitness guidelines for people with Parkinson's disease.  HollywoodSale.dk Dance for PD website is offering free, live-stream classes throughout the week, as well as links to [employer] of classes:  [email or web address]/ Dance for Parkinson's Class:  [place].  Free offering for people with Parkinson's and care partners; virtual class.  For more information, contact [phone] or email Ruffin Frederick at [email or web address] Virtual dance and Pilates for Parkinson's classes: Click on the Community Tab> Parkinson's Movement Initiative Tab.  To register for classes and for more information, visit www.[email or web address] and click the "community" tab.  [employer] Parkinson's Cycling Classes  [employer]: 1pm on Fridays-Live classes at [employer] ([employer] at [email or web address] or [phone]) [employer]: Virtual Classes Mondays and Thursdays Jeanette Caprice classes Tuesday, Wednesday and Thursday (contact [place] at [email or web address]  or [phone]) [place] Varied levels of classes are offered Mondays, Tuesdays and Thursdays at [employer].  To observe a class or for more information, call [phone] or email [email or web address] Well[employer]Spring [employer]: Online Caregiver Education Opportunities:  www.well-springsolutions.org/caregiver-education/caregiver-support-group.  You may also contact Vickki Muff at [email or web address] or [phone].   *Multiple opportunities below, as November is Caregiver Month!* A Guide to Physical and Mental Fitness for Family Caregivers.  Wednesday, November 9th, 12:30-2 pm at [facility] of You!  Tuesday, November 15th, 11:30-12:45.  [facility], [place] 05-01-1995 Understanding Care Options and Advanced Care Planning.  Monday, November 21st, 4:30-5:45.  [place], [place] above to register. Well-Spring Navigator:  [address] program, a free service to help individuals and families through the journey of determining care for older adults.  The "Navigator" is a [employer], Education officer, museum, who will speak with a prospective client and/or loved ones to provide an assessment of the situation and a set of recommendations for a personalized care plan -- all free of charge, and whether Well-Spring [employer] offers the needed service or not. If the need is not a service we provide, we are well-connected with reputable programs in town that we can refer you to.  www.well-springsolutions.org or to speak with the Navigator, call [phone].

## 2021-10-14 ENCOUNTER — Other Ambulatory Visit: Payer: Self-pay | Admitting: Physician Assistant

## 2021-10-14 DIAGNOSIS — K7469 Other cirrhosis of liver: Secondary | ICD-10-CM

## 2021-10-15 ENCOUNTER — Telehealth: Payer: Self-pay | Admitting: Neurology

## 2021-10-15 MED ORDER — CARBIDOPA-LEVODOPA 25-100 MG PO TABS: 2.0000 | ORAL_TABLET | Freq: Four times a day (QID) | ORAL | 1 refills | Status: DC

## 2021-10-15 NOTE — Telephone Encounter (Signed)
Tried calling the patient, No answer. LMOVM New dosing script sent to the [facility] [address] [facility] mail in pharmacy.

## 2021-10-15 NOTE — Telephone Encounter (Signed)
After reviewing the patient chart it looks like the dosing for    carbidopa/levodopa 25/100, 2 at 6am/10am/2pm/6pm (currently on 3 po tid with dyskinesia) was added as discussed at patient visit but it never was sent to the pharmacy. It was changed or to No Print.   Please advise if it is okay to send new script to [facility] mail in pharmacy?

## 2021-10-15 NOTE — Telephone Encounter (Signed)
1. Which medications need refilled? (List name and dosage, if known) carbidopa-levodopa, 25-100 MG  2. Which pharmacy/location is medication to be sent to? (include street and city if local pharmacy) [facility]  3. Do they need a 30 day or 90 day supply? 90 day increments  Patient has 30 days left at this point but will be needing this to go to mail order.

## 2021-11-06 ENCOUNTER — Other Ambulatory Visit (HOSPITAL_COMMUNITY)
Admit: 2021-11-06 | Discharge: 2021-11-06 | Disposition: A | Discharge status: Home / Self Care | Payer: Self-pay | Attending: Student in an Organized Health Care Education/Training Program | Admitting: Student in an Organized Health Care Education/Training Program

## 2021-11-06 ENCOUNTER — Other Ambulatory Visit (HOSPITAL_COMMUNITY): Payer: Self-pay | Admitting: Student in an Organized Health Care Education/Training Program

## 2021-11-06 ENCOUNTER — Ambulatory Visit
Payer: Self-pay | Attending: Student in an Organized Health Care Education/Training Program | Admitting: Student in an Organized Health Care Education/Training Program

## 2021-11-06 DIAGNOSIS — K760 Fatty (change of) liver, not elsewhere classified: Secondary | ICD-10-CM

## 2021-11-06 LAB — CBC/DIFF - CLIENT CONSOLIDATED
BASOPHIL #: <0.1 10*3/uL (ref <=0.20)
BASOPHIL %: 1 %
EOSINOPHIL #: <0.1 10*3/uL (ref <=0.50)
EOSINOPHIL %: 2 %
HCT: 38.6 % — ABNORMAL LOW (ref 38.9–52.0)
HGB: 13.2 g/dL — ABNORMAL LOW (ref 13.4–17.5)
IMMATURE GRANULOCYTE #: <0.1 10*3/uL (ref <0.10)
IMMATURE GRANULOCYTE %: 0 % (ref 0–1)
LYMPHOCYTE #: 0.52 10*3/uL — ABNORMAL LOW (ref 1.00–4.80)
LYMPHOCYTE %: 21 %
MCH: 33.7 pg — ABNORMAL HIGH (ref 26.0–32.0)
MCHC: 34.2 g/dL (ref 31.0–35.5)
MCV: 98.5 fL (ref 78.0–100.0)
MONOCYTE #: 0.43 10*3/uL (ref 0.20–1.10)
MONOCYTE %: 17 %
MPV: 11.9 fL (ref 8.7–12.5)
NEUTROPHIL #: 1.48 10*3/uL — ABNORMAL LOW (ref 1.50–7.70)
NEUTROPHIL %: 59 %
PLATELETS: 55 10*3/uL — ABNORMAL LOW (ref 150–400)
RBC: 3.92 10*6/uL — ABNORMAL LOW (ref 4.50–6.10)
RDW-CV: 13.3 % (ref 11.5–15.5)
WBC: 2.5 10*3/uL — ABNORMAL LOW (ref 3.7–11.0)

## 2021-11-06 LAB — CBC WITH DIFF
BASOPHIL #: <0.1 10*3/uL (ref <=0.20)
BASOPHIL %: 1 %
EOSINOPHIL #: <0.1 10*3/uL (ref <=0.50)
EOSINOPHIL %: 2 %
HCT: 38.6 % — ABNORMAL LOW (ref 38.9–52.0)
HGB: 13.2 g/dL — ABNORMAL LOW (ref 13.4–17.5)
IMMATURE GRANULOCYTE #: <0.1 10*3/uL (ref <0.10)
IMMATURE GRANULOCYTE %: 0 % (ref 0–1)
LYMPHOCYTE #: 0.52 10*3/uL — ABNORMAL LOW (ref 1.00–4.80)
LYMPHOCYTE %: 21 %
MCH: 33.7 pg — ABNORMAL HIGH (ref 26.0–32.0)
MCHC: 34.2 g/dL (ref 31.0–35.5)
MCV: 98.5 fL (ref 78.0–100.0)
MONOCYTE #: 0.43 10*3/uL (ref 0.20–1.10)
MONOCYTE %: 17 %
MPV: 11.9 fL (ref 8.7–12.5)
NEUTROPHIL #: 1.48 10*3/uL — ABNORMAL LOW (ref 1.50–7.70)
NEUTROPHIL %: 59 %
PLATELETS: 55 10*3/uL — ABNORMAL LOW (ref 150–400)
RBC: 3.92 10*6/uL — ABNORMAL LOW (ref 4.50–6.10)
RDW-CV: 13.3 % (ref 11.5–15.5)
WBC: 2.5 10*3/uL — ABNORMAL LOW (ref 3.7–11.0)

## 2021-11-06 LAB — COMPREHENSIVE METABOLIC PANEL, NON-FASTING
ALBUMIN: 3.4 g/dL (ref 3.4–4.8)
ALKALINE PHOSPHATASE: 54 U/L (ref 45–115)
ALT (SGPT): 13 U/L (ref 10–55)
ANION GAP: 7 mmol/L (ref 4–13)
AST (SGOT): 62 U/L — ABNORMAL HIGH (ref 8–45)
BILIRUBIN TOTAL: 1.8 mg/dL — ABNORMAL HIGH (ref 0.3–1.3)
BUN/CREA RATIO: 16 (ref 6–22)
BUN: 13 mg/dL (ref 8–25)
CALCIUM: 8.1 mg/dL — ABNORMAL LOW (ref 8.8–10.2)
CHLORIDE: 106 mmol/L (ref 96–111)
CO2 TOTAL: 24 mmol/L (ref 23–31)
CREATININE: 0.79 mg/dL (ref 0.75–1.35)
ESTIMATED GFR: >90 mL/min/BSA (ref >=60)
GLUCOSE: 140 mg/dL — ABNORMAL HIGH (ref 65–125)
POTASSIUM: 3.7 mmol/L (ref 3.5–5.1)
PROTEIN TOTAL: 6 g/dL (ref 5.6–7.6)
SODIUM: 137 mmol/L (ref 136–145)

## 2021-11-06 LAB — PT/INR
INR: 1.38 — ABNORMAL HIGH (ref 0.80–1.20)
PROTHROMBIN TIME: 15.6 seconds — ABNORMAL HIGH (ref 9.2–13.2)

## 2021-11-13 ENCOUNTER — Other Ambulatory Visit: Payer: Medicare Other

## 2021-12-22 ENCOUNTER — Ambulatory Visit
Admission: RE | Admit: 2021-12-22 | Discharge: 2021-12-22 | Disposition: A | Discharge status: Home / Self Care | Payer: Medicare Other | Source: Ambulatory Visit | Attending: Physician Assistant | Admitting: Physician Assistant

## 2021-12-22 DIAGNOSIS — K7469 Other cirrhosis of liver: Secondary | ICD-10-CM

## 2021-12-26 ENCOUNTER — Other Ambulatory Visit: Payer: Self-pay

## 2021-12-26 ENCOUNTER — Ambulatory Visit (INDEPENDENT_AMBULATORY_CARE_PROVIDER_SITE_OTHER): Payer: Medicare Other | Admitting: Podiatry

## 2021-12-26 ENCOUNTER — Encounter: Payer: Self-pay | Admitting: Podiatry

## 2021-12-26 DIAGNOSIS — E119 Type 2 diabetes mellitus without complications: Secondary | ICD-10-CM

## 2021-12-26 DIAGNOSIS — B351 Tinea unguium: Secondary | ICD-10-CM | POA: Diagnosis not present

## 2021-12-26 DIAGNOSIS — M79675 Pain in left toe(s): Secondary | ICD-10-CM

## 2021-12-26 DIAGNOSIS — M79674 Pain in right toe(s): Secondary | ICD-10-CM | POA: Diagnosis not present

## 2021-12-26 DIAGNOSIS — D696 Thrombocytopenia, unspecified: Secondary | ICD-10-CM

## 2021-12-26 NOTE — Progress Notes (Signed)
This patient returns to the office for evaluation and treatment of long thick painful nails .  This patient is unable to trim his own nails since the patient cannot reach his feet.  Patient says the nails are painful walking and wearing his shoes.  He returns for preventive foot care services.  ? ?General Appearance  Alert, conversant and in no acute stress. ? ?Vascular  Dorsalis pedis and posterior tibial  pulses are palpable  bilaterally.  Capillary return is within normal limits  bilaterally. Temperature is within normal limits  bilaterally. ? ?Neurologic  Senn-Weinstein monofilament wire test within normal limits  bilaterally. Muscle power within normal limits bilaterally. ? ?Nails Thick disfigured discolored nails with subungual debris  from hallux to fifth toes bilaterally. No evidence of bacterial infection or drainage bilaterally. ? ?Orthopedic  No limitations of motion  feet .  No crepitus or effusions noted.  No bony pathology or digital deformities noted. Retrocalcaneal spur right foot. ? ?Skin  normotropic skin with no porokeratosis noted bilaterally.  No signs of infections or ulcers noted.    ? ?Onychomycosis  Pain in toes right foot  Pain in toes left foot ? ?Debridement  of nails  1-5  B/L with a nail nipper.  Nails were then filed using a dremel tool with no incidents.    RTC 3 months ? ? ?Gardiner Barefoot DPM  ?

## 2022-01-12 ENCOUNTER — Other Ambulatory Visit: Payer: Self-pay | Admitting: Neurology

## 2022-03-24 ENCOUNTER — Other Ambulatory Visit: Payer: Self-pay | Admitting: *Deleted

## 2022-03-24 NOTE — Patient Outreach (Signed)
[facility]) Care Management  03/24/2022  KEAGON GLASCOE [birth year] [id number]  Referral Received : 5/30 Initial Outreach : 5/30  RN attempted outreach call today however unsuccessful. RN able to leave a HIPAA approved voice message requesting a call back.   Will attempt another outreach call over the next week for [facility] services.  Raina Mina, RN Care Management Coordinator [place] Office [phone]

## 2022-03-30 NOTE — Progress Notes (Signed)
Assessment/Plan:   1.  Parkinsons Disease  -Continue carbidopa/levodopa 25/100, 2 tablets at 6 AM/10 AM/2 PM/6 PM  -Continue carbidopa/levodopa 50/200 at bedtime   2.  OSAS             -is followed by Dr. Rexene Alberts.  On cpap   3.  Hx of vasovagal syncope             -this was remote and last episode 8-10 years ago   4.  Dysphagia             -Patient had a barium swallow (not modified barium swallow) in May, 2022.  This just demonstrated mild esophageal dysmotility, as well as evidence of known esophageal varices. He sees GI   5.  Hx of BCC -We discussed that it used to be thought that levodopa would increase risk of melanoma but now it is believed that Parkinsons itself likely increases risk of melanoma. he is to get regular skin checks.  He does that regularly due to hx of multiple BCC   6.  Parkinsons Disease dyskinesia             -not seen today   7.  Possible Parkinsons Disease related hallucinations             -will monitor and spread out levodopa differently   8.  nocturia  -he sees urology.  [facility]  He states that he was taken off of tamsulosin b/c PSA was good but the frequent urination is worse.  Told him to f/u.    9.  Constipation  -discussed nature and pathophysiology and association with PD  -discussed importance of hydration.  Pt is to increase water intake  -pt is given a copy of the rancho recipe  -recommended daily colace  -recommended miralax prn  10.  Memory change, per wife  -I don't suspect PDD  -he would like to replace hearing aids  -if hearing aids don't help, then we discussed neurocog testing.  11.  Pt planning to move to [facility] but he wants to continue care here even when moves.  That may be a lot but happy to help however we can. Subjective:   Mike Dunn was seen today in follow up for Parkinsons disease.  My previous records were reviewed prior to todays visit as well as outside records available to me.  This patient is  accompanied in the office by his spouse who supplements the history.  Last visit, we changed around his levodopa dosing, somewhat because of dyskinesia.  We added nighttime levodopa for cramping and facilitating walking in the middle of the night.  He reports today that changing around the levodopa has worked.  He knows when he is late for the dose.  He does c/o frequent nocturia.  Also, c/o constipation.  pt denies falls.  Pt denies lightheadedness, near syncope.  No hallucinations.  Mood has been good.  Wife asks about memory - but thinks that hearing could affect that - he has hearing aids but they don't work well.    Current prescribed movement disorder medications: Carbidopa/levodopa 25/100, 2 tablets at 6 AM/10 AM/2 PM/6 PM (changed last visit from 3 tablets 3 times per day) Carbidopa/levodopa 50/200 CR at bedtime (added last visit)  PREVIOUS MEDICATIONS: none to date  ALLERGIES:   Allergies  Allergen Reactions   Zocor [Simvastatin] Other (See Comments)    Caused numbness    CURRENT MEDICATIONS:  Current Meds  Medication  Sig   carbidopa-levodopa (SINEMET CR) 50-200 MG tablet TAKE 1 TABLET AT BEDTIME   carbidopa-levodopa (SINEMET IR) 25-100 MG tablet TAKE 2 TABLETS BY MOUTH 4  TIMES DAILY AT 6AM, 10AM, 2PM, 6PM   carvedilol (COREG) 6.25 MG tablet    co-enzyme Q-10 50 MG capsule Take 50 mg by mouth daily.   fish oil-omega-3 fatty acids 1000 MG capsule Take 1 g by mouth daily.   losartan (COZAAR) 25 MG tablet Take by mouth.   melatonin 5 MG TABS Take 5 mg by mouth.   pantoprazole (PROTONIX) 40 MG tablet Take 40 mg by mouth daily.   Probiotic Product (ALIGN) 4 MG CAPS Take 4 mg by mouth daily.   rosuvastatin (CRESTOR) 10 MG tablet Take 10 mg by mouth daily.     Objective:   PHYSICAL EXAMINATION:    VITALS:   Vitals:   04/02/22 0810  Weight: 233 lb 6.4 oz (105.9 kg)  Height: '5\' 8"'$  (1.727 m)    GEN:  The patient appears stated age and is in NAD. HEENT:  Normocephalic,  atraumatic.  The mucous membranes are moist. The superficial temporal arteries are without ropiness or tenderness. CV:  RRR Lungs:  CTAB Neck/HEME:  There are no carotid bruits bilaterally.  Neurological examination:  Orientation: The patient is alert and oriented x3. Cranial nerves: There is good facial symmetry with min facial hypomimia. The speech is fluent and clear. Soft palate rises symmetrically and there is no tongue deviation. Hearing is intact to conversational tone. Sensation: Sensation is intact to light touch throughout Motor: Strength is at least antigravity x4.  Movement examination: Tone: There is nltone in the bilateral upper extremities.  The tone in the lower extremities is nl.  Abnormal movements: no dyskinesia Coordination:  There is mild decremation with RAM's, mostly with toe taps on the L Gait and Station: The patient has mild difficulty arising out of a deep-seated chair without the use of the hands (able to do it on 2nd attempt - same as previous visit). The patient's stride length is decreased with dragging of the L leg.  The patient has a neg pull test.      I have reviewed and interpreted the following labs independently    Chemistry      Component Value Date/Time   NA 136 02/10/2018 0100   NA 140 08/01/2015 1208   K 3.6 02/10/2018 0100   K 3.9 08/01/2015 1208   CL 104 02/10/2018 0100   CO2 22 02/10/2018 0100   CO2 23 08/01/2015 1208   BUN 15 02/10/2018 0100   BUN 13.0 08/01/2015 1208   CREATININE 0.60 (L) 02/10/2018 0100   CREATININE 0.8 08/01/2015 1208      Component Value Date/Time   CALCIUM 8.5 (L) 02/10/2018 0100   CALCIUM 9.8 08/01/2015 1208   ALKPHOS 55 08/01/2015 1208   AST 51 (H) 08/01/2015 1208   ALT 35 08/01/2015 1208   BILITOT 1.37 (H) 08/01/2015 1208       Lab Results  Component Value Date   WBC 5.9 02/28/2018   HGB 14.9 02/28/2018   HCT 42.1 02/28/2018   MCV 96.8 02/28/2018   PLT 70 (L) 02/28/2018    Lab Results   Component Value Date   TSH 1.987 05/09/2011     Total time spent on today's visit was 30 minutes, including both face-to-face time and nonface-to-face time.  Time included that spent on review of records (prior notes available to me/labs/imaging if pertinent), discussing treatment and  goals, answering patient's questions and coordinating care.  Cc:  Antony Contras, MD

## 2022-04-01 ENCOUNTER — Other Ambulatory Visit: Payer: Self-pay | Admitting: *Deleted

## 2022-04-01 NOTE — Patient Outreach (Signed)
[facility]) Care Management  04/01/2022  Mike Dunn February 15, 1956 [id number]  Telephone Assessment/Screening-Declined [facility] services  RN spoke with pt today and introduced [facility] services and the purpose for today's call. Verified credentials. Pt reports the only issues currently being addressed by  his providers is his Parkinson. Pt states he has a doctor's appointment tomorrow to address this issues further. Pt has a good support system, sufficient transportation and adherent with all his medications. RN further inquired on his HTN as pt states his blood pressures is "fine with issues" and his diabetes with good glucose readings around 110 in the mornings.RN inquired on safety is the home due to his medical condition as pt denies any related incidents or issues.   Pt states he lives in [place] most of the time however comes home off and on to this residents and maintains his primary provider (Mike Dunn)  here in [place]. Although I have explained [facility] is able to assist with safety/fall prevention and improving ongoing monitoring for his HTN and DM pt has opted to declined case management needs at this time. Pt declined any issues related to social work or pharmacy needs at this time.   No other issues or needs to address at this time. Mike Dunn will be notified of pt's disposition with [facility] services as this case will be closed.  Raina Mina, RN Care Management Coordinator [place] Office [phone]

## 2022-04-02 ENCOUNTER — Ambulatory Visit (INDEPENDENT_AMBULATORY_CARE_PROVIDER_SITE_OTHER): Payer: Medicare Other | Admitting: Neurology

## 2022-04-02 ENCOUNTER — Encounter: Payer: Self-pay | Admitting: Neurology

## 2022-04-02 VITALS — BP 130/60 | HR 67 | Ht 68.0 in | Wt 233.4 lb

## 2022-04-02 DIAGNOSIS — G20 Parkinson's disease: Secondary | ICD-10-CM | POA: Diagnosis not present

## 2022-04-02 NOTE — Patient Instructions (Addendum)
Constipation and Parkinson's disease:    1.Rancho recipe for constipation in Parkinsons Disease:   -1 cup of unprocessed bran (need to get this at [employer], [employer] or similar type of store), 2 cups of applesauce in 1 cup of prune juice  2.  Increase fiber intake (Metamucil,vegetables)  3.  Regular, moderate exercise can be beneficial.  4.  Avoid medications causing constipation, such as medications like antacids with calcium or magnesium  5.  It's okay to take daily Miralax, and taper if stools become too loose or you experience diarrhea  6.  Stool softeners (Colace) can help with chronic constipation and I recommend you take this daily.  7.  Increase water intake.  You should be drinking 1/2 gallon of water a day as long as you have not been diagnosed with congestive heart failure or renal/kidney failure.  This is probably the single greatest thing that you can do to help your constipation.    Local and Online Resources for Power over Parkinson's Group  June 2023    LOCAL [place] PARKINSON'S GROUPS   Power over Parkinson's Group:    Power Over Parkinson's Patient Education Group will be Wednesday, June 14th-*Hybrid meting*- in person at [facility] location and via [facility] at 2:00 pm.    Upcoming Power over [employer] Meetings:  2nd Wednesdays of the month at 2 pm:   June 14th, July 12th  Contact Amy Marriott at [email or web address] (link: mailto:[email or web address]) if interested in participating in this group  Parkinson's Care Partners Group:    3rd Mondays, Contact Misty Paladino  Atypical Parkinsonian Patient Group:   4th Wednesdays, Contact Misty Paladino  If you are interested in participating in these groups with Misty, please contact her directly for how to join those meetings.  Her contact information is [email or web address] (link: [email or web address]).      LOCAL EVENTS AND NEW OFFERINGS  Dance Class for People with Parkinson's at  [facility].  Friday, June 9th at 2 pm.  Nolen Mu by Sherrie Sport DPT students.  Contact [email or web address] (link: mailto:[email or web address]) to register or with questions.  Ice Cream Social at [place]!  Thursday, June 15th, 5:30-7:00 pm.  RSVP to [email or web address] (link: mailto:[email or web address]) for attendance and free ice cream.  Parkinson's T-shirts for sale!  Designed by a local group member, with funds going to Movement Disorders Fund.  $25.00  Technical sales engineer to purchase   [employer]! Moves [employer] Instructor-Led Class offering at [employer]!  Wednesdays 1-2 pm, starting April 12th.   Contact Irma Newness, Visual merchandiser at [employer].  [email or web address] (link: mailto:[email or web address])    ONLINE EDUCATION AND SUPPORT  Parkinson Foundation:  www.parkinson.org  PD Health at Home continues:  Mindfulness Mondays, Wellness Wednesdays, Fitness Fridays   Upcoming Education: Parkinson's 101:  What You and Your Family Should Know.  Wednesday, June 7th at 1:00 pm  Register for expert briefings (webinars) at [email or web address]  Please check out their website to sign up for emails and see their full online offerings      [employer] Foundation:  www.[email or web address]   Third Thursday Webinars:  On the third Thursday of every month at 12 p.m. ET, join our free live webinars to learn about various aspects of living with Parkinson's disease and our work to speed medical breakthroughs.  Upcoming Webinar: REPLAY:  From Low Blood Pressure to Bladder Problems:  A Look at Lesser Known Parkinson's Symptoms.  Thursday, June 15th at 12 noon.  Check out additional information on their website to see their full online offerings  [employer] Foundation:  www.davisphinneyfoundation.org  Upcoming Webinar:   Stay tuned  Webinar Series:  Living with Parkinson's Meetup.   Third Thursdays each month, 3 pm  Care Partner Monthly  Meetup.  With Jillene Bucks Phinney.  First Tuesday of each month, 2 pm  Check out additional information to Live Well Today on their website    Parkinson and Movement Disorders (PMD) Alliance:  www.pmdalliance.org  NeuroLife Online:  Online Education Events  Sign up for emails, which are sent weekly to give you updates on programming and online offerings    Parkinson's Association of the [place]:  www.parkinsonassociation.org  Information on online support groups, education events, and online exercises including Yoga, Parkinson's exercises and more-LOTS of information on links to PD resources and online events  Virtual Support Group through Parkinson's Association of the [place]; next one is scheduled for Wednesday, June 7th at 2 pm. (These are typically scheduled for the 1st Wednesday of the month at 2 pm).  Visit website for details.  Save the date for "Caring for Parkinson's-Caring for You", 9th Annual Symposium.  In-person event in [place].  September 9th.  More info on registration to come.  MOVEMENT AND EXERCISE OPPORTUNITIES  PWR! Moves Classes at [facility] Exercise Room.  Wednesdays 10 and 11 am.   Contact Amy Marriott, PT [email or web address] (link: mailto:[email or web address]) if interested.  NEW PWR! Moves Class offering at [employer].  Wednesdays 1-2 pm, starting April 12th.  Contact Irma Newness, Visual merchandiser at [employer].  [email or web address] (link: mailto:[email or web address])  Here is a link to the PWR!Moves classes on Zoom from [place] - Daily Mon-Sat at 10:00. Via Zoom, FREE and open to all.  There is also a link below via Facebook if you use that platform.    CopCameras.is  [email or web address]    Parkinson's Wellness Recovery (PWR! Moves)   www.pwr4life.org  Info on the PWR! Virtual Experience:  You will have access to our expertise through self-assessment, guided plans that start with the PD-specific fundamentals, educational content, tips, Q&A with an expert, and a growing Engineering geologist of PD-specific pre-recorded and live exercise classes of varying types and intensity - both physical and cognitive! If that is not enough, we offer 1:1 wellness consultations (in-person or virtual) to personalize your PWR! Dance movement psychotherapist.   Parkinson [employer] Fridays:   As part of the PD Health @ Home program, this free video series focuses each week on one aspect of fitness designed to support people living with Parkinson's.  These weekly videos highlight the Parkinson Foundation recent fitness guidelines for people with Parkinson's disease.  MenusLocal.com.br  Dance for PD website is offering free, live-stream classes throughout the week, as well as links to [employer] of classes:  [email or web address]://danceforparkinsons.[email or web address]  Virtual dance and Pilates for Parkinson's classes: Click on the Community Tab> Parkinson's Movement Initiative Tab.  To register for classes and for more information, visit www.[email or web address].co.[email or web address] and click the "community" tab.   [employer] Parkinson's Cycling Classes   [facility] [employer]:  Thursdays @ Noon-Live classes at [employer] ([employer] at [email or web address] or [phone])  [facility]: Virtual Classes Mondays and Thursdays Fawn Kirk classes Tuesday, Wednesday and Thursday (contact [place] at [email or web address] (link: mailto:[email or web address])  or [phone])  [facility] Boxing  Varied levels of classes are offered Tuesdays and Thursdays at [employer].   Stretching with Byrd Hesselbach weekly class is also offered for people with Parkinson's  To observe a class or for more information, call [phone] or email Patricia Nettle at [email or web address] (link:  mailto:[email or web address])  ADDITIONAL SUPPORT AND RESOURCES  Well-Spring Solutions:Online Caregiver Education Opportunities:  www.well-springsolutions.org/caregiver-education/caregiver-support-group.  You may also contact Loleta Chance at [facility] -spring.org (link: mailto:[email or web address]) or [phone].     Well-Spring Navigator:  [employer] program, a free service to help individuals and families through the journey of determining care for older adults.  The "Navigator" is a Child psychotherapist, Sidney Ace, who will speak with a prospective client and/or loved ones to provide an assessment of the situation and a set of recommendations for a personalized care plan -- all free of charge, and whether Well-Spring Solutions offers the needed service or not. If the need is not a service we provide, we are well-connected with reputable programs in town that we can refer you to.  www.well-springsolutions.org or to speak with the Navigator, call [phone].  Family Caregiver Programming in June:  Friends Against Fraud, Thursday, June 15th 11-12:30 at [place]. [address] [place].  Call [phone] to register

## 2022-04-02 NOTE — Patient Instructions (Addendum)
Constipation and Parkinson's disease:  1.Rancho recipe for constipation in Parkinsons Disease:  -1 cup of unprocessed bran (need to get this at [employer], [employer] or similar type of store), 2 cups of applesauce in 1 cup of prune juice 2.  Increase fiber intake (Metamucil,vegetables) 3.  Regular, moderate exercise can be beneficial. 4.  Avoid medications causing constipation, such as medications like antacids with calcium or magnesium 5.  It's okay to take daily Miralax, and taper if stools become too loose or you experience diarrhea 6.  Stool softeners (Colace) can help with chronic constipation and I recommend you take this daily. 7.  Increase water intake.  You should be drinking 1/2 gallon of water a day as long as you have not been diagnosed with congestive heart failure or renal/kidney failure.  This is probably the single greatest thing that you can do to help your constipation.  Local and Online Resources for Power over Parkinson's Group June 2023  LOCAL [place] PARKINSON'S GROUPS  Power over Parkinson's Group:   Power Over Parkinson's Patient Education Group will be Wednesday, June 14th-*Hybrid meting*- in person at [facility] location and via [facility] at 2:00 pm.   Upcoming Power over [employer] Meetings:  2nd Wednesdays of the month at 2 pm:   June 14th, July 12th Contact Amy Marriott at [email or web address] if interested in participating in this group Parkinson's Care Partners Group:    3rd Mondays, Contact Misty Paladino Atypical Parkinsonian Patient Group:   4th Wednesdays, Contact Misty Paladino If you are interested in participating in these groups with Misty, please contact her directly for how to join those meetings.  Her contact information is [email or web address].    LOCAL EVENTS AND NEW OFFERINGS Dance Class for People with Parkinson's at [facility].  Friday, June 9th at 2 pm.  Dora Sims by Tyler Deis DPT students.  Contact [email or web address] to  register or with questions. Ice Cream Social at [place]!  Thursday, June 15th, 5:30-7:00 pm.  RSVP to [email or web address] for attendance and free ice cream. Parkinson's T-shirts for sale!  Designed by a local group member, with funds going to [place].  $25.00  Investment banker, corporate to purchase  [employer]! Moves [employer] Instructor-Led Class offering at [employer]!  Wednesdays 1-2 pm, starting April 12th.   Contact Bryson Dames, Acupuncturist at [employer].  Manuela [email or web address]  [place]:  www.[email or web address].org PD Health at Home continues:  Mindfulness Mondays, Wellness Wednesdays, Fitness Fridays  Upcoming Education: Parkinson's 101:  What You and Your Family Should Know.  Wednesday, June 7th at 1:00 pm Register for expert briefings (webinars) at WatchCalls.si Please check out their website to sign up for emails and see their full online offerings   [place]:  www.[email or web address]  Third Thursday Webinars:  On the third Thursday of every month at 12 p.m. ET, join our free live webinars to learn about various aspects of living with Parkinson's disease and our work to speed medical breakthroughs. Upcoming Webinar: REPLAY:  From Low Blood Pressure to Bladder Problems:  A Look at Lesser Known Parkinson's Symptoms.  Thursday, June 15th at 12 noon. Check out additional information on their website to see their full online offerings  [facility]:  www.[email or web address] Upcoming Webinar:   Stay tuned Webinar Series:  Living with Parkinson's Meetup.   Third Thursdays each month, 3 pm Care Partner Monthly Meetup.  With Robin Searing Phinney.  First Tuesday of each month, 2 pm Check out additional information to Live  Well Today on their website  Parkinson and Movement Disorders (PMD) Alliance:   www.pmdalliance.org NeuroLife Online:  Online Education Events Sign up for emails, which are sent weekly to give you updates on programming and online offerings  Parkinson's Association of the [place]:  www.parkinsonassociation.org Information on online support groups, education events, and online exercises including Yoga, Parkinson's exercises and more-LOTS of information on links to PD resources and online events Virtual Support Group through Parkinson's Association of the [place]; next one is scheduled for Wednesday, June 7th at 2 pm. (These are typically scheduled for the 1st Wednesday of the month at 2 pm).  Visit website for details. Save the date for "Caring for Parkinson's-Caring for You", 9th Annual Symposium.  In-person event in [place].  September 9th.  More info on registration to come. MOVEMENT AND EXERCISE OPPORTUNITIES PWR! Moves Classes at [place].  Wednesdays 10 and 11 am.   Contact Amy Marriott, PT [email or web address] if interested. NEW PWR! Moves Class offering at [employer].  Wednesdays 1-2 pm, starting April 12th.  Contact Bryson Dames, Acupuncturist at [employer].  Manuela [email or web address] Here is a link to the PWR!Moves classes on Zoom from [place] - Daily Mon-Sat at 10:00. Via Zoom, FREE and open to all.  There is also a link below via Facebook if you use that platform.  AptDealers.si [email or web address]  Parkinson's Wellness Recovery (PWR! Moves)  www.pwr4life.org Info on the PWR! Virtual Experience:  You will have access to our expertise through self-assessment, guided plans that start with the PD-specific fundamentals, educational content, tips, Q&A with an expert, and a growing Art therapist of PD-specific pre-recorded and live  exercise classes of varying types and intensity - both physical and cognitive! If that is not enough, we offer 1:1 wellness consultations (in-person or virtual) to personalize your PWR! Research scientist (medical).  [place] Fridays:  As part of the PD Health @ Home program, this free video series focuses each week on one aspect of fitness designed to support people living with Parkinson's.  These weekly videos highlight the [place] recent fitness guidelines for people with Parkinson's disease. ModemGamers.si Dance for PD website is offering free, live-stream classes throughout the week, as well as links to [employer] of classes:  https://danceforparkinsons.org/ Virtual dance and Pilates for Parkinson's classes: Click on the Community Tab> Parkinson's Movement Initiative Tab.  To register for classes and for more information, visit www.SeekAlumni.co.za and click the "community" tab.  [employer] Parkinson's Cycling Classes  [facility] [employer]:  Thursdays @ Noon-Live classes at [employer] ([employer] at [email or web address] or [phone]) [facility] [employer]: Virtual Classes Mondays and Thursdays Jeanette Caprice classes Tuesday, Wednesday and Thursday (contact [place] at [email or web address]  or [phone]) [place] Varied levels of classes are offered Tuesdays and Thursdays at [employer].  Stretching with Verdis Frederickson weekly class is also offered for people with Parkinson's To observe a class or for more information, call [phone] or email Hezzie Bump at [email or web address] .[email or web address] ADDITIONAL SUPPORT AND RESOURCES Well-Spring Solutions:Online Caregiver Education Opportunities:  www.well-springsolutions.org/caregiver-education/caregiver-support-group.  You may also contact Vickki Muff at [email or web address] or [phone].    Well-Spring Navigator:  [phone]  program, a free service to help individuals and families through the journey of determining care for older adults.  The "Navigator" is a [employer], Education officer, museum, who will speak with a prospective client and/or loved ones to provide an assessment of the situation and a set of recommendations for a personalized care plan -- all free of charge, and whether Well-Spring  Solutions offers the needed service or not. If the need is not a service we provide, we are well-connected with reputable programs in town that we can refer you to.  www.well-springsolutions.org or to speak with the Navigator, call [phone]. Family Caregiver Programming in June:  Friends Against Fraud, Thursday, June 15th 11-12:30 at [place]. [place].  Call [phone] to register

## 2022-05-01 ENCOUNTER — Encounter: Payer: Self-pay | Admitting: Podiatry

## 2022-05-01 ENCOUNTER — Ambulatory Visit (INDEPENDENT_AMBULATORY_CARE_PROVIDER_SITE_OTHER): Payer: Medicare Other | Admitting: Podiatry

## 2022-05-01 DIAGNOSIS — B351 Tinea unguium: Secondary | ICD-10-CM

## 2022-05-01 DIAGNOSIS — D696 Thrombocytopenia, unspecified: Secondary | ICD-10-CM | POA: Diagnosis not present

## 2022-05-01 DIAGNOSIS — M79674 Pain in right toe(s): Secondary | ICD-10-CM

## 2022-05-01 DIAGNOSIS — M79675 Pain in left toe(s): Secondary | ICD-10-CM | POA: Diagnosis not present

## 2022-05-01 DIAGNOSIS — E119 Type 2 diabetes mellitus without complications: Secondary | ICD-10-CM

## 2022-05-01 NOTE — Progress Notes (Signed)
This patient returns to my office for at risk foot care.  This patient requires this care by a professional since this patient will be at risk due to having diabetes and thrombocytopenia.  This patient is unable to cut nails himself since the patient cannot reach his nails.These nails are painful walking and wearing shoes.  This patient presents for at risk foot care today.  General Appearance  Alert, conversant and in no acute stress.  Vascular  Dorsalis pedis and posterior tibial  pulses are palpable  bilaterally.  Capillary return is within normal limits  bilaterally. Temperature is within normal limits  bilaterally.  Neurologic  Senn-Weinstein monofilament wire test within normal limits  bilaterally. Muscle power within normal limits bilaterally.  Nails Thick disfigured discolored nails with subungual debris  from hallux to fifth toes bilaterally. No evidence of bacterial infection or drainage bilaterally.  Orthopedic  No limitations of motion  feet .  No crepitus or effusions noted.  No bony pathology or digital deformities noted.  Skin  normotropic skin with no porokeratosis noted bilaterally.  No signs of infections or ulcers noted.     Onychomycosis  Pain in right toes  Pain in left toes  Consent was obtained for treatment procedures.   Mechanical debridement of nails 1-5  bilaterally performed with a nail nipper.  Filed with dremel without incident.    Return office visit    3 months                  Told patient to return for periodic foot care and evaluation due to potential at risk complications.   Joel Mericle DPM   

## 2022-05-06 ENCOUNTER — Ambulatory Visit: Payer: Medicare Other | Admitting: Podiatry

## 2022-06-26 ENCOUNTER — Other Ambulatory Visit: Payer: Self-pay | Admitting: Neurology

## 2022-06-26 DIAGNOSIS — G20 Parkinson's disease: Secondary | ICD-10-CM

## 2022-08-12 ENCOUNTER — Ambulatory Visit: Payer: Medicare Other | Admitting: Podiatry

## 2022-08-19 ENCOUNTER — Telehealth: Payer: Self-pay | Admitting: Adult Health

## 2022-08-19 NOTE — Telephone Encounter (Signed)
LVM and sent mychart msg informing pt of r/s needed for 10/31 appt- NP out. 

## 2022-08-25 ENCOUNTER — Ambulatory Visit: Payer: Medicare Other | Admitting: Adult Health

## 2022-08-26 ENCOUNTER — Ambulatory Visit: Payer: Medicare Other | Admitting: Podiatry

## 2022-09-23 ENCOUNTER — Other Ambulatory Visit: Payer: Self-pay | Admitting: Neurology

## 2022-09-23 DIAGNOSIS — G20A1 Parkinson's disease without dyskinesia, without mention of fluctuations: Secondary | ICD-10-CM

## 2022-09-25 ENCOUNTER — Other Ambulatory Visit (HOSPITAL_COMMUNITY): Payer: Self-pay | Admitting: Student in an Organized Health Care Education/Training Program

## 2022-09-25 ENCOUNTER — Other Ambulatory Visit (HOSPITAL_COMMUNITY): Admit: 2022-09-25 | Discharge: 2022-09-25 | Disposition: A | Discharge status: Home / Self Care | Payer: Self-pay

## 2022-09-25 DIAGNOSIS — D696 Thrombocytopenia, unspecified: Secondary | ICD-10-CM

## 2022-09-25 DIAGNOSIS — I10 Essential (primary) hypertension: Secondary | ICD-10-CM

## 2022-09-25 DIAGNOSIS — K760 Fatty (change of) liver, not elsewhere classified: Secondary | ICD-10-CM

## 2022-09-25 DIAGNOSIS — E785 Hyperlipidemia, unspecified: Secondary | ICD-10-CM

## 2022-09-29 NOTE — Progress Notes (Signed)
Assessment/Plan:   1.  Parkinsons Disease  -Continue carbidopa/levodopa 25/100, 2 tablets at 6 AM/10 AM/2 PM/6 PM  -Continue carbidopa/levodopa 50/200 at bedtime  -congratulated him on the exercise  -he is scheduled to see derm in jan 2.  OSAS             -is followed by Dr. Rexene Alberts.  On cpap   3.  Hx of vasovagal syncope             -this was remote and last episode 8-10 years ago   4.  Dysphagia             -Patient had a barium swallow (not modified barium swallow) in May, 2022.  This just demonstrated mild esophageal dysmotility, as well as evidence of known esophageal varices. He sees GI   5.  Hx of BCC -We discussed that it used to be thought that levodopa would increase risk of melanoma but now it is believed that Parkinsons itself likely increases risk of melanoma. he is to get regular skin checks.  He does that regularly due to hx of multiple BCC.  He is scheduled in Jan     6.  Parkinsons Disease dyskinesia             -not seen today   7.  nocturia  -he sees urology.  [facility] sees [facility].    8.  Memory change, per wife  -I don't suspect PDD  -he would like to replace hearing aids  -if hearing aids don't help, then we discussed neurocog testing.  9.  Sialorrhea  -This is commonly associated with PD.  We talked about treatments.  The patient is not a candidate for oral anticholinergic therapy because of increased risk of confusion and falls.  We discussed Botox (type A and B) and 1% atropine drops.  We discusssed that candy like lemon drops can help by stimulating mm of the oropharynx to induce swallowing.  10.  Has partially moved to [facility] and may/may not find a doc there.  For now, is maintaining care here. Subjective:   Mike Dunn was seen today in follow up for Parkinsons disease.  My previous records were reviewed prior to todays visit as well as outside records available to me.  This patient is accompanied in the office by his spouse who supplements the  history. Doing well with PT.  Some dyskinesia that is "annoying" to patient and "concerning" to wife.  Notes more drooling.  Hasn't moved completely yet but has found physicians in [place].   Current prescribed movement disorder medications: Carbidopa/levodopa 25/100, 2 tablets at 6 AM/10 AM/2 PM/6 PM  Carbidopa/levodopa 50/200 CR at bedtime   PREVIOUS MEDICATIONS: none to date  ALLERGIES:   Allergies  Allergen Reactions   Zocor [Simvastatin] Other (See Comments)    Caused numbness    CURRENT MEDICATIONS:  Current Meds  Medication Sig   carbidopa-levodopa (SINEMET CR) 50-200 MG tablet TAKE 1 TABLET AT BEDTIME   carbidopa-levodopa (SINEMET IR) 25-100 MG tablet TAKE 2 TABLETS 4  TIMES DAILY AT 6AM, 10AM, 2PM, 6PM   carvedilol (COREG) 6.25 MG tablet    co-enzyme Q-10 50 MG capsule Take 50 mg by mouth daily.   fish oil-omega-3 fatty acids 1000 MG capsule Take 1 g by mouth daily.   losartan (COZAAR) 25 MG tablet Take by mouth.   pantoprazole (PROTONIX) 40 MG tablet Take 40 mg by mouth daily.   Probiotic Product (ALIGN) 4 MG CAPS  Take 4 mg by mouth daily.   rosuvastatin (CRESTOR) 10 MG tablet Take 10 mg by mouth daily.     Objective:   PHYSICAL EXAMINATION:    VITALS:   Vitals:   10/02/22 0909  BP: 124/76  Pulse: 61  SpO2: 97%  Weight: 232 lb 12.8 oz (105.6 kg)  Height: '5\' 8"'$  (1.727 m)     GEN:  The patient appears stated age and is in NAD. HEENT:  Normocephalic, atraumatic.  The mucous membranes are moist. The superficial temporal arteries are without ropiness or tenderness. CV:  RRR Lungs:  CTAB Neck/HEME:  There are no carotid bruits bilaterally.  Neurological examination:  Orientation: The patient is alert and oriented x3. Cranial nerves: There is good facial symmetry with min facial hypomimia. The speech is fluent and clear. Soft palate rises symmetrically and there is no tongue deviation. Hearing is intact to conversational tone. Sensation: Sensation is intact to  light touch throughout Motor: Strength is at least antigravity x4.  Movement examination: Tone: There is nltone in the bilateral upper extremities.  The tone in the lower extremities is nl.  Abnormal movements: none today Coordination:  There is mild decremation with RAM's, mostly with toe taps on the L Gait and Station: The patient has no trouble arising OOC.  He is a bit short stepped and slow   I have reviewed and interpreted the following labs independently    Chemistry      Component Value Date/Time   NA 136 02/10/2018 0100   NA 140 08/01/2015 1208   K 3.6 02/10/2018 0100   K 3.9 08/01/2015 1208   CL 104 02/10/2018 0100   CO2 22 02/10/2018 0100   CO2 23 08/01/2015 1208   BUN 15 02/10/2018 0100   BUN 13.0 08/01/2015 1208   CREATININE 0.60 (L) 02/10/2018 0100   CREATININE 0.8 08/01/2015 1208      Component Value Date/Time   CALCIUM 8.5 (L) 02/10/2018 0100   CALCIUM 9.8 08/01/2015 1208   ALKPHOS 55 08/01/2015 1208   AST 51 (H) 08/01/2015 1208   ALT 35 08/01/2015 1208   BILITOT 1.37 (H) 08/01/2015 1208       Lab Results  Component Value Date   WBC 5.9 02/28/2018   HGB 14.9 02/28/2018   HCT 42.1 02/28/2018   MCV 96.8 02/28/2018   PLT 70 (L) 02/28/2018    Lab Results  Component Value Date   TSH 1.987 05/09/2011     Total time spent on today's visit was 24 minutes, including both face-to-face time and nonface-to-face time.  Time included that spent on review of records (prior notes available to me/labs/imaging if pertinent), discussing treatment and goals, answering patient's questions and coordinating care.  Cc:  Antony Contras, MD

## 2022-10-02 ENCOUNTER — Ambulatory Visit (INDEPENDENT_AMBULATORY_CARE_PROVIDER_SITE_OTHER): Payer: Medicare Other | Admitting: Neurology

## 2022-10-02 ENCOUNTER — Encounter: Payer: Self-pay | Admitting: Neurology

## 2022-10-02 VITALS — BP 124/76 | HR 61 | Ht 68.0 in | Wt 232.8 lb

## 2022-10-02 DIAGNOSIS — K117 Disturbances of salivary secretion: Secondary | ICD-10-CM | POA: Diagnosis not present

## 2022-10-02 DIAGNOSIS — G20A2 Parkinson's disease without dyskinesia, with fluctuations: Secondary | ICD-10-CM | POA: Diagnosis not present

## 2022-10-05 ENCOUNTER — Other Ambulatory Visit (HOSPITAL_COMMUNITY): Payer: Self-pay | Admitting: Student in an Organized Health Care Education/Training Program

## 2022-10-05 ENCOUNTER — Other Ambulatory Visit (HOSPITAL_COMMUNITY)
Admit: 2022-10-05 | Discharge: 2022-10-05 | Disposition: A | Discharge status: Home / Self Care | Payer: Self-pay | Attending: Student in an Organized Health Care Education/Training Program | Admitting: Student in an Organized Health Care Education/Training Program

## 2022-10-05 DIAGNOSIS — G20B1 Parkinson's disease with dyskinesia, without mention of fluctuations: Secondary | ICD-10-CM

## 2022-10-05 DIAGNOSIS — K760 Fatty (change of) liver, not elsewhere classified: Secondary | ICD-10-CM

## 2022-10-21 ENCOUNTER — Encounter: Payer: Self-pay | Admitting: Neurology

## 2022-10-27 ENCOUNTER — Other Ambulatory Visit (HOSPITAL_COMMUNITY): Payer: Self-pay | Admitting: Student in an Organized Health Care Education/Training Program

## 2022-10-27 DIAGNOSIS — K7460 Unspecified cirrhosis of liver: Secondary | ICD-10-CM

## 2022-10-27 DIAGNOSIS — E785 Hyperlipidemia, unspecified: Secondary | ICD-10-CM

## 2022-10-27 DIAGNOSIS — D6959 Other secondary thrombocytopenia: Secondary | ICD-10-CM

## 2022-10-27 DIAGNOSIS — R410 Disorientation, unspecified: Secondary | ICD-10-CM

## 2022-10-28 ENCOUNTER — Telehealth (HOSPITAL_BASED_OUTPATIENT_CLINIC_OR_DEPARTMENT_OTHER): Payer: Self-pay | Admitting: Student in an Organized Health Care Education/Training Program

## 2022-10-28 ENCOUNTER — Other Ambulatory Visit (HOSPITAL_COMMUNITY): Payer: Self-pay | Admitting: Student in an Organized Health Care Education/Training Program

## 2022-10-28 DIAGNOSIS — K7460 Unspecified cirrhosis of liver: Secondary | ICD-10-CM

## 2022-10-28 NOTE — Telephone Encounter (Signed)
Called patient to schedule NPV, lvm with appointment date and time I did not mail out a letter as patient's address is [place]. I left contact information if the patient needed to call.

## 2022-11-05 ENCOUNTER — Inpatient Hospital Stay
Admission: RE | Admit: 2022-11-05 | Discharge: 2022-11-05 | Disposition: A | Discharge status: Home / Self Care | Payer: Medicare Other | Source: Ambulatory Visit | Attending: Student in an Organized Health Care Education/Training Program | Admitting: Student in an Organized Health Care Education/Training Program

## 2022-11-05 DIAGNOSIS — R410 Disorientation, unspecified: Secondary | ICD-10-CM

## 2022-11-05 DIAGNOSIS — E785 Hyperlipidemia, unspecified: Secondary | ICD-10-CM

## 2022-11-05 DIAGNOSIS — D6959 Other secondary thrombocytopenia: Secondary | ICD-10-CM

## 2022-11-05 DIAGNOSIS — K7460 Unspecified cirrhosis of liver: Secondary | ICD-10-CM | POA: Insufficient documentation

## 2022-11-05 LAB — FERRITIN: FERRITIN: 120 ng/mL (ref 20–300)

## 2022-11-05 LAB — CBC/DIFF - CLIENT CONSOLIDATED
BASOPHIL #: <0.1 10*3/uL (ref <=0.20)
BASOPHIL %: 1 %
EOSINOPHIL #: 0.13 10*3/uL (ref <=0.50)
EOSINOPHIL %: 4 %
HCT: 40.3 % (ref 38.9–52.0)
HGB: 14.1 g/dL (ref 13.4–17.5)
IMMATURE GRANULOCYTE #: <0.1 10*3/uL (ref <0.10)
IMMATURE GRANULOCYTE %: 0 % (ref 0.0–1.0)
LYMPHOCYTE #: 0.81 10*3/uL — ABNORMAL LOW (ref 1.00–4.80)
LYMPHOCYTE %: 25 %
MCH: 33.6 pg — ABNORMAL HIGH (ref 26.0–32.0)
MCHC: 35 g/dL (ref 31.0–35.5)
MCV: 96 fL (ref 78.0–100.0)
MONOCYTE #: 0.31 10*3/uL (ref 0.20–1.10)
MONOCYTE %: 10 %
NEUTROPHIL #: 1.99 10*3/uL (ref 1.50–7.70)
NEUTROPHIL %: 60 %
PLATELETS: 95 10*3/uL — ABNORMAL LOW (ref 150–400)
RBC: 4.2 10*6/uL — ABNORMAL LOW (ref 4.50–6.10)
RDW-CV: 13.9 % (ref 11.5–15.5)
WBC: 3.3 10*3/uL — ABNORMAL LOW (ref 3.7–11.0)

## 2022-11-05 LAB — CBC WITH DIFF
BASOPHIL #: <0.1 10*3/uL (ref <=0.20)
BASOPHIL %: 1 %
EOSINOPHIL #: 0.13 10*3/uL (ref <=0.50)
EOSINOPHIL %: 4 %
HCT: 40.3 % (ref 38.9–52.0)
HGB: 14.1 g/dL (ref 13.4–17.5)
IMMATURE GRANULOCYTE #: <0.1 10*3/uL (ref <0.10)
IMMATURE GRANULOCYTE %: 0 % (ref 0.0–1.0)
LYMPHOCYTE #: 0.81 10*3/uL — ABNORMAL LOW (ref 1.00–4.80)
LYMPHOCYTE %: 25 %
MCH: 33.6 pg — ABNORMAL HIGH (ref 26.0–32.0)
MCHC: 35 g/dL (ref 31.0–35.5)
MCV: 96 fL (ref 78.0–100.0)
MONOCYTE #: 0.31 10*3/uL (ref 0.20–1.10)
MONOCYTE %: 10 %
NEUTROPHIL #: 1.99 10*3/uL (ref 1.50–7.70)
NEUTROPHIL %: 60 %
PLATELETS: 95 10*3/uL — ABNORMAL LOW (ref 150–400)
RBC: 4.2 10*6/uL — ABNORMAL LOW (ref 4.50–6.10)
RDW-CV: 13.9 % (ref 11.5–15.5)
WBC: 3.3 10*3/uL — ABNORMAL LOW (ref 3.7–11.0)

## 2022-11-05 LAB — HEPATITIS B SURFACE ANTIGEN: HBV SURFACE ANTIGEN QUALITATIVE: NEGATIVE

## 2022-11-05 LAB — LIPID PANEL
CHOL/HDL RATIO: 2.8
CHOLESTEROL: 137 mg/dL (ref 100–200)
HDL CHOL: 49 mg/dL — ABNORMAL LOW (ref >=50)
LDL CALC: 73 mg/dL (ref <100)
NON-HDL: 88 mg/dL (ref <=190)
TRIGLYCERIDES: 74 mg/dL (ref <150)
VLDL CALC: 11 mg/dL (ref <30)

## 2022-11-05 LAB — HEPATITIS B CORE ANTIBODY: HBV CORE TOTAL ANTIBODIES: NEGATIVE

## 2022-11-05 LAB — AMMONIA: AMMONIA: 140 umol/L — ABNORMAL HIGH (ref <=70)

## 2022-11-05 LAB — IRON: IRON: 120 ug/dL (ref 55–175)

## 2022-11-09 ENCOUNTER — Other Ambulatory Visit: Payer: Self-pay

## 2022-11-11 ENCOUNTER — Other Ambulatory Visit (HOSPITAL_COMMUNITY)
Admission: RE | Admit: 2022-11-11 | Discharge: 2022-11-11 | Disposition: A | Discharge status: Home / Self Care | Payer: Medicare Other | Source: Ambulatory Visit | Attending: Student in an Organized Health Care Education/Training Program | Admitting: Student in an Organized Health Care Education/Training Program

## 2022-11-11 ENCOUNTER — Ambulatory Visit
Payer: Medicare Other | Attending: Student in an Organized Health Care Education/Training Program | Admitting: Student in an Organized Health Care Education/Training Program

## 2022-11-11 ENCOUNTER — Other Ambulatory Visit (HOSPITAL_COMMUNITY): Payer: Self-pay | Admitting: Student in an Organized Health Care Education/Training Program

## 2022-11-11 DIAGNOSIS — D696 Thrombocytopenia, unspecified: Secondary | ICD-10-CM

## 2022-11-11 DIAGNOSIS — K760 Fatty (change of) liver, not elsewhere classified: Secondary | ICD-10-CM

## 2022-11-11 DIAGNOSIS — I10 Essential (primary) hypertension: Secondary | ICD-10-CM

## 2022-11-11 DIAGNOSIS — E785 Hyperlipidemia, unspecified: Secondary | ICD-10-CM

## 2022-11-11 DIAGNOSIS — K7460 Unspecified cirrhosis of liver: Secondary | ICD-10-CM

## 2022-11-11 DIAGNOSIS — Z125 Encounter for screening for malignant neoplasm of prostate: Secondary | ICD-10-CM

## 2022-11-11 LAB — HEPATITIS C ANTIBODY SCREEN WITH REFLEX TO HCV PCR: HCV ANTIBODY QUALITATIVE: NEGATIVE

## 2022-11-11 LAB — HEPATIC FUNCTION PANEL
ALBUMIN: 3.4 g/dL (ref 3.4–4.8)
ALKALINE PHOSPHATASE: 60 U/L (ref 45–115)
ALT (SGPT): 16 U/L (ref 10–55)
AST (SGOT): 49 U/L — ABNORMAL HIGH (ref 8–45)
BILIRUBIN DIRECT: 0.7 mg/dL — ABNORMAL HIGH (ref 0.1–0.4)
BILIRUBIN TOTAL: 1.9 mg/dL — ABNORMAL HIGH (ref 0.3–1.3)
PROTEIN TOTAL: 6.3 g/dL (ref 6.0–8.0)

## 2022-11-11 LAB — HGA1C (HEMOGLOBIN A1C WITH EST AVG GLUCOSE)
ESTIMATED AVERAGE GLUCOSE: 123 mg/dL
HEMOGLOBIN A1C: 5.9 % (ref 4.3–6.1)

## 2022-11-11 LAB — CBC WITH DIFF
BASOPHIL #: <0.1 10*3/uL (ref <=0.20)
BASOPHIL %: 1 %
EOSINOPHIL #: 0.11 10*3/uL (ref <=0.50)
EOSINOPHIL %: 3 %
HCT: 39 % (ref 38.9–52.0)
HGB: 13.8 g/dL (ref 13.4–17.5)
IMMATURE GRANULOCYTE #: <0.1 10*3/uL (ref <0.10)
IMMATURE GRANULOCYTE %: 0 % (ref 0.0–1.0)
LYMPHOCYTE #: 0.84 10*3/uL — ABNORMAL LOW (ref 1.00–4.80)
LYMPHOCYTE %: 25 %
MCH: 33.7 pg — ABNORMAL HIGH (ref 26.0–32.0)
MCHC: 35.4 g/dL (ref 31.0–35.5)
MCV: 95.4 fL (ref 78.0–100.0)
MONOCYTE #: 0.37 10*3/uL (ref 0.20–1.10)
MONOCYTE %: 11 %
MPV: 12.1 fL (ref 8.7–12.5)
NEUTROPHIL #: 2.07 10*3/uL (ref 1.50–7.70)
NEUTROPHIL %: 60 %
PLATELETS: 80 10*3/uL — ABNORMAL LOW (ref 150–400)
RBC: 4.09 10*6/uL — ABNORMAL LOW (ref 4.50–6.10)
RDW-CV: 13.6 % (ref 11.5–15.5)
WBC: 3.4 10*3/uL — ABNORMAL LOW (ref 3.7–11.0)

## 2022-11-11 LAB — BASIC METABOLIC PANEL, FASTING
ANION GAP: 7 mmol/L (ref 4–13)
BUN/CREA RATIO: 19 (ref 6–22)
BUN: 14 mg/dL (ref 8–25)
CALCIUM: 8.6 mg/dL (ref 8.6–10.3)
CHLORIDE: 108 mmol/L (ref 96–111)
CO2 TOTAL: 22 mmol/L — ABNORMAL LOW (ref 23–31)
CREATININE: 0.75 mg/dL (ref 0.75–1.35)
ESTIMATED GFR - MALE: >90 mL/min/BSA (ref >=60)
GLUCOSE: 95 mg/dL (ref 70–99)
POTASSIUM: 4.1 mmol/L (ref 3.5–5.1)
SODIUM: 137 mmol/L (ref 136–145)

## 2022-11-11 LAB — LIPID PANEL
CHOL/HDL RATIO: 2.8
CHOLESTEROL: 136 mg/dL (ref 100–200)
HDL CHOL: 49 mg/dL — ABNORMAL LOW (ref >=50)
LDL CALC: 71 mg/dL (ref <100)
NON-HDL: 87 mg/dL (ref <=190)
TRIGLYCERIDES: 80 mg/dL (ref <150)
VLDL CALC: 12 mg/dL (ref <30)

## 2022-11-11 LAB — CBC/DIFF - CLIENT CONSOLIDATED
BASOPHIL #: <0.1 10*3/uL (ref <=0.20)
BASOPHIL %: 1 %
EOSINOPHIL #: 0.11 10*3/uL (ref <=0.50)
EOSINOPHIL %: 3 %
HCT: 39 % (ref 38.9–52.0)
HGB: 13.8 g/dL (ref 13.4–17.5)
IMMATURE GRANULOCYTE #: <0.1 10*3/uL (ref <0.10)
IMMATURE GRANULOCYTE %: 0 % (ref 0.0–1.0)
LYMPHOCYTE #: 0.84 10*3/uL — ABNORMAL LOW (ref 1.00–4.80)
LYMPHOCYTE %: 25 %
MCH: 33.7 pg — ABNORMAL HIGH (ref 26.0–32.0)
MCHC: 35.4 g/dL (ref 31.0–35.5)
MCV: 95.4 fL (ref 78.0–100.0)
MONOCYTE #: 0.37 10*3/uL (ref 0.20–1.10)
MONOCYTE %: 11 %
MPV: 12.1 fL (ref 8.7–12.5)
NEUTROPHIL #: 2.07 10*3/uL (ref 1.50–7.70)
NEUTROPHIL %: 60 %
PLATELETS: 80 10*3/uL — ABNORMAL LOW (ref 150–400)
RBC: 4.09 10*6/uL — ABNORMAL LOW (ref 4.50–6.10)
RDW-CV: 13.6 % (ref 11.5–15.5)
WBC: 3.4 10*3/uL — ABNORMAL LOW (ref 3.7–11.0)

## 2022-11-11 LAB — HEPATITIS B SURFACE ANTIBODY: HBV SURFACE ANTIBODY QUANTITATIVE: 60 m[IU]/mL — ABNORMAL HIGH (ref <8)

## 2022-11-11 LAB — PSA SCREENING: PSA: 0.14 ng/mL (ref <=4.00)

## 2022-11-11 LAB — PT/INR
INR: 1.34 — ABNORMAL HIGH (ref 0.80–1.20)
PROTHROMBIN TIME: 15 seconds — ABNORMAL HIGH (ref 9.2–13.2)

## 2022-11-11 LAB — HEPATITIS A (HAV) IGG ANTIBODY: HAV IGG ANTIBODY: REACTIVE — AB

## 2022-12-07 ENCOUNTER — Other Ambulatory Visit: Payer: Self-pay

## 2022-12-07 ENCOUNTER — Other Ambulatory Visit (HOSPITAL_BASED_OUTPATIENT_CLINIC_OR_DEPARTMENT_OTHER): Payer: Self-pay | Admitting: Student in an Organized Health Care Education/Training Program

## 2022-12-07 ENCOUNTER — Encounter (HOSPITAL_BASED_OUTPATIENT_CLINIC_OR_DEPARTMENT_OTHER): Payer: Self-pay | Admitting: Student in an Organized Health Care Education/Training Program

## 2022-12-07 ENCOUNTER — Other Ambulatory Visit (HOSPITAL_COMMUNITY): Payer: Medicare Other

## 2022-12-07 ENCOUNTER — Ambulatory Visit
Payer: Medicare Other | Attending: Student in an Organized Health Care Education/Training Program | Admitting: Student in an Organized Health Care Education/Training Program

## 2022-12-07 VITALS — BP 137/66 | HR 70 | Temp 98.3°F | Resp 16 | Ht 68.0 in | Wt 224.9 lb

## 2022-12-07 DIAGNOSIS — Z7722 Contact with and (suspected) exposure to environmental tobacco smoke (acute) (chronic): Secondary | ICD-10-CM | POA: Insufficient documentation

## 2022-12-07 DIAGNOSIS — K760 Fatty (change of) liver, not elsewhere classified: Secondary | ICD-10-CM

## 2022-12-07 DIAGNOSIS — D696 Thrombocytopenia, unspecified: Secondary | ICD-10-CM

## 2022-12-07 DIAGNOSIS — K7460 Unspecified cirrhosis of liver: Secondary | ICD-10-CM

## 2022-12-07 DIAGNOSIS — K7682 Hepatic encephalopathy: Secondary | ICD-10-CM

## 2022-12-07 DIAGNOSIS — Z1211 Encounter for screening for malignant neoplasm of colon: Secondary | ICD-10-CM

## 2022-12-07 DIAGNOSIS — K219 Gastro-esophageal reflux disease without esophagitis: Secondary | ICD-10-CM

## 2022-12-07 DIAGNOSIS — E559 Vitamin D deficiency, unspecified: Secondary | ICD-10-CM

## 2022-12-07 DIAGNOSIS — G20A1 Parkinson's disease without dyskinesia, without mention of fluctuations: Secondary | ICD-10-CM

## 2022-12-07 DIAGNOSIS — N400 Enlarged prostate without lower urinary tract symptoms: Secondary | ICD-10-CM

## 2022-12-07 DIAGNOSIS — I8500 Esophageal varices without bleeding: Secondary | ICD-10-CM

## 2022-12-07 LAB — CBC
HCT: 40.3 % (ref 38.9–52.0)
HGB: 13.7 g/dL (ref 13.4–17.5)
MCH: 32.7 pg — ABNORMAL HIGH (ref 26.0–32.0)
MCHC: 34 g/dL (ref 31.0–35.5)
MCV: 96.2 fL (ref 78.0–100.0)
MPV: 12.4 fL (ref 8.7–12.5)
PLATELETS: 79 10*3/uL — ABNORMAL LOW (ref 150–400)
RBC: 4.19 10*6/uL — ABNORMAL LOW (ref 4.50–6.10)
RDW-CV: 13.8 % (ref 11.5–15.5)
WBC: 3.9 10*3/uL (ref 3.7–11.0)

## 2022-12-07 LAB — COMPREHENSIVE METABOLIC PANEL, NON-FASTING
ALBUMIN: 3.3 g/dL — ABNORMAL LOW (ref 3.4–4.8)
ALKALINE PHOSPHATASE: 66 U/L (ref 45–115)
ALT (SGPT): 14 U/L (ref 10–55)
ANION GAP: 6 mmol/L (ref 4–13)
AST (SGOT): 47 U/L — ABNORMAL HIGH (ref 8–45)
BILIRUBIN TOTAL: 2.3 mg/dL — ABNORMAL HIGH (ref 0.3–1.3)
BUN/CREA RATIO: 18 (ref 6–22)
BUN: 13 mg/dL (ref 8–25)
CALCIUM: 9.1 mg/dL (ref 8.6–10.3)
CHLORIDE: 112 mmol/L — ABNORMAL HIGH (ref 96–111)
CO2 TOTAL: 22 mmol/L — ABNORMAL LOW (ref 23–31)
CREATININE: 0.73 mg/dL — ABNORMAL LOW (ref 0.75–1.35)
ESTIMATED GFR - MALE: >90 mL/min/BSA (ref >=60)
GLUCOSE: 98 mg/dL (ref 65–125)
POTASSIUM: 3.7 mmol/L (ref 3.5–5.1)
PROTEIN TOTAL: 6.5 g/dL (ref 6.0–8.0)
SODIUM: 140 mmol/L (ref 136–145)

## 2022-12-07 LAB — IMMUNOGLOBULIN A (IGA), SERUM: IMMUNOGLOBULIN A (IGA): 241 mg/dL (ref 101–645)

## 2022-12-07 LAB — ALPHA FETOPROTEIN (AFP) TUMOR MARKER: AFP TUMOR MARKER: 3 ng/mL (ref <=9)

## 2022-12-07 LAB — ALPHA-1-ANTITRYPSIN: ALPHA 1 ANTITRYPSIN: 125 mg/dL (ref 90–200)

## 2022-12-07 LAB — IRON TRANSFERRIN AND TIBC
IRON (TRANSFERRIN) SATURATION: 38 % (ref 15–50)
IRON: 140 ug/dL (ref 55–175)
TOTAL IRON BINDING CAPACITY: 371 ug/dL (ref 224–476)
TRANSFERRIN: 265 mg/dL (ref 160–340)

## 2022-12-07 LAB — FERRITIN: FERRITIN: 111 ng/mL (ref 20–300)

## 2022-12-07 LAB — CERULOPLASMIN: CERULOPLASMIN: 25 mg/dL (ref 18–45)

## 2022-12-07 LAB — VITAMIN D 25 TOTAL: VITAMIN D 25, TOTAL: 17.8 ng/mL — ABNORMAL LOW (ref 30.0–100.0)

## 2022-12-07 LAB — PT/INR: INR: 1.26 — ABNORMAL HIGH (ref 0.80–1.10)

## 2022-12-07 MED ORDER — ERGOCALCIFEROL (VITAMIN D2) 1,250 MCG (50,000 UNIT) CAPSULE
50000.0000 [IU] | ORAL_CAPSULE | ORAL | 0 refills | Status: AC
Start: 2022-12-07 — End: 2023-03-30

## 2022-12-07 MED ORDER — LACTULOSE 10 GRAM/15 ML ORAL SOLUTION
30.0000 mL | Freq: Three times a day (TID) | ORAL | 11 refills | Status: DC
Start: 2022-12-07 — End: 2023-04-30

## 2022-12-07 MED ORDER — BISACODYL 5 MG TABLET
4.0000 | ORAL_TABLET | Freq: Once | ORAL | 0 refills | Status: AC
Start: 2022-12-07 — End: 2022-12-07

## 2022-12-07 MED ORDER — CARVEDILOL 6.25 MG TABLET
6.2500 mg | ORAL_TABLET | Freq: Two times a day (BID) | ORAL | 4 refills | Status: DC
Start: 2022-12-07 — End: 2023-05-29

## 2022-12-07 MED ORDER — PEG 3350-ELECTROLYTES 236 GRAM-22.74 GRAM-6.74 GRAM-5.86 GRAM SOLUTION
4.0000 L | Freq: Once | ORAL | 0 refills | Status: AC
Start: 2022-12-07 — End: 2022-12-07

## 2022-12-07 NOTE — Nursing Note (Signed)
New patient here for cirrhosis of the liver. Patient denies complaints for this visit.

## 2022-12-07 NOTE — Progress Notes (Signed)
[facility]                                                      Department of Gastroenterology  [place], [place] [address]    Date:   12/07/2022  Name: Jacob Santiago  Age: 67 y.o.    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, thrombocytopenia      Clinic visit   Feb-12-24   =================  Patient is here for new visit   Patient has history of fatty liver disease, nonalcoholic  Denies any current or previous alcohol drinking  No family history of liver disease or GI cancer  Was diagnosed with liver cirrhosis around 10 years ago, he was following with previous GI doctor who is retired now  Patient lives 3 hours from here  Last EGD long time ago showed varices, no banding, no report available  Colonoscopy in 2020, denies any previous polyps, no family history of colon cancer, no report available, patient due for surveillance    Has thrombocytopenia thrombocytopenia,   Heartburn controlled with Protonix  Patient taking meloxicam as needed that was stopped recently  Patient on losartan, instructed to stop  Patient taking carvedilol 6.125 mg b.i.d.    No previous ascites or paracentesis, no previous variceal bleeding  Has history of elevated ammonia and hepatic encephalopathy, he ran out of lactulose    The patient denies any  difficulty with swallowing, abdominal pain, nausea, vomiting, abdominal bloating, constipation, diarrhea,  rectal bleeding, melena, hematemesis, unintentional weight loss or other complaints.        Review of Systems  See HPI.  No chest pain or sob  No fever or chills     Past Medical History: He  has a past medical history of BPH (benign prostatic hyperplasia), Esophageal reflux, Hepatic cirrhosis (CMS HCC), Nonalcoholic fatty liver disease, Parkinson's disease, and Thrombocytopenia, unspecified (CMS [place]).      Past Surgical History: He  has no past surgical history on file.    Social History: His  reports that he has never smoked. He has been exposed to tobacco smoke. He has never used smokeless tobacco., ,     Family History: He family history is not on file.    Allergies: He has No Known Allergies.    Home Medication:   Current Outpatient Medications   Medication Sig    carbidopa-levodopa (SINEMET CR) 50-200 mg Oral Tablet Sustained Release     carbidopa-Levodopa (SINEMET) 25-100 mg Oral Tablet TAKE 3 TABLETS BY MOUTH THREE TIMES DAILY    carvediloL (COREG) 6.25 mg Oral Tablet Take 1 tablet twice a day by oral route.    LOSARTAN POTASSIUM, BULK, N/A 25 mg    pantoprazole (PROTONIX) 40 mg Oral Tablet, Delayed Release (  E.C.) Take 1 tablet every day by oral route as needed.    tamsulosin (FLOMAX) 0.4 mg Oral Capsule Take 1 capsule every day by oral route.       Examination:  BP 137/66   Pulse 70   Temp 36.8 C (98.3 F) (Temporal)   Resp 16   Ht 1.727 m (5' 8"$ )   Wt 102 kg (224 lb 13.9 oz)   SpO2 97%   BMI 34.19 kg/m        Wt Readings from Last 3 Encounters:   12/07/22 102 kg (224 lb 13.9 oz)       General: Resting comfortably, no acute distress.  Eyes: Conjunctiva normal, sclera non-icteric.  HENT: Atraumatic and normocephalic.   Neck:  Supple.   Lungs: Breathing comfortably.  No respiratory distress.  Abdomen: soft lax, not tender   Extremities: No cyanosis or edema  Neurologic: Alert, Oriented, Grossly normal  Psychiatric: Appears normal    Data/Chart/Labs/Imaging reviewed:  -CBC January 2024 WBC 3.4,  hemoglobin 13.8, platelets 80  -INR January 1024 was 1.34  -BMP January 2024 unremarkable  -liver panel January 2024 total bilirubin 1.9, AST 49, otherwise unremarkable  -ammonia level January 2024 140  -A1c January 2024 5.9  -hepatitis panel in January 2024 showed positive hepatitis-A IgG, negative hepatitis-B surface antigen, negative hepatitis-c antibody, positive hepatitis-B surface antibody  -modified barium swallow in May 2022 showed no hiatal hernia, no evidence of gastroesophageal reflux disease, few tubular filling defect in the lower 3rd of the esophagus compatible with esophageal varices, mild esophageal dysmotility  _________________________________________________________________________  Assessment and Plan  Jacob Santiago is a 67 y.o. 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 diagnosed 10 years ago with hepatic encephalopathy and recently elevated ammonia level, currently on lactulose, esophageal varices  without history of bleeding, no previous ascites, MELD-Na is 12   Gastroesophageal reflux disease controlled with Protonix  History of Parkinson disease on levodopa carbidopa  BPH  Chronic thrombocytopenia  Colon cancer screening due, last colonoscopy in 2020 was without polyps, no report available, no family history of colon cancer    My recommendations are as follows:    -I personally reviewed previous GI workup/procedures/labs and relevant imaging as stated above  Will schedule EGD for variceal screening  Will schedule surveillance colonoscopy  Requested patient to send [place] the report of previous procedures  Start [place] screening with ultrasound and AFP every 6 months  Patient is immune to hepatitis-A and B  -Will order comprehensive screening tests for alpha-1 antitrypsin, ceruloplasmin, mitochondrial antibody, smooth muscle antibody, tTG IgA, total IgA level, iron studies, viral hepatitis panel.  Will check vitamin-D  Check CBC, CMP,  INR  Will give refill for lactulose t.i.d., titrate to 3 bowel movements per day  Recommended to stop losartan in the setting of cirrhosis  Continue carvedilol 6.25 mg p.o. b.i.d. for primary prophylaxis of variceal bleeding          Education Regarding Cirrhosis  - The etiology and natural history of cirrhosis was discussed with the patient.  This included the risk of progression to complications of end stage liver disease.  - Good liver care was discussed with the patient.  This includes avoiding hepatotoxic substances.  Specifically, no more than 2g of tylenol should be consumed a day.        Cirrhosis Guidelines:     Tylenol (acetaminophen) not to exceed 2 gms or 4 extra strength tablets in a 24  hour period.  Check all medications - many combination products contain acetaminophen.  If possible, avoid all aspirin and nonsteroidal products (Aleve, Motrin, Celebrex, etc.)  No herbal products.  No raw seafood or hamburger.  Avoid skin contact with sea water or beach sand in warm months.  Complete abstinence from all alcohol and illegal drugs.  No smoking.  Multivitamin and calcium (1000-1500 mg) with vitamin D daily.  2 gram sodium diet.  This means avoid very salty foods such as canned soup and pickled goods.  Do not use salt substitute, but Mrs. Dash and lemon juice are safe.  Cirrhosis complications include liver failure and liver cancer.  You should have regular medical check ups and screening for liver cancer (blood test and scan) every three to six months.  Notify us immediately if you have any of the following  Eyes turn yellow  You become confused  Your belly starts to swell  You vomit blood (you should go to the nearest emergency room)            Discussed with patient the consent for EGD/Colonoscopy. Alternative to endoscopy were also discussed. The risks/benefits and complications including but not limited to anesthesia, bleeding, dental injury, aspiration, CV/pulmonary risk, perforation, infection,  missed lesions, need for surgery & remote possibility of death were discussed with patient who understand and agreed to proceed.       The patient was given the opportunity to ask questions and those questions were appropriately answered. The patient agreed with the treatment plan and is encouraged to call with any additional questions or concerns.     Return to the clinic in 6 months    Clenton Pare, [place] - Gastroenterology  [place],   [id number]    Note: A portion of this documentation was generated using MMODAL (voice recognition software) and may contain syntax/voice recognition errors.

## 2022-12-08 ENCOUNTER — Encounter (HOSPITAL_BASED_OUTPATIENT_CLINIC_OR_DEPARTMENT_OTHER): Payer: Self-pay | Admitting: Student in an Organized Health Care Education/Training Program

## 2022-12-09 ENCOUNTER — Telehealth (HOSPITAL_BASED_OUTPATIENT_CLINIC_OR_DEPARTMENT_OTHER): Payer: Self-pay | Admitting: Student in an Organized Health Care Education/Training Program

## 2022-12-09 LAB — TISSUE TRANSGLUTAMINASE (TTG) ANTIBODY, IGA, SERUM
TISSUE TRANSGLUTAMINASE ANTIBODIES IGA QUALITATIVE: NEGATIVE
TISSUE TRANSGLUTAMINASE ANTIBODIES IGA QUANTITATIVE: 1.6 U/mL (ref <15.0)

## 2022-12-09 LAB — MITOCHONDRIAL ANTIBODIES (M2), SERUM: MITOCHONDRIA M2 ANTIBODY (IGG), EIA: <=20 U (ref <=20.0)

## 2022-12-09 NOTE — Telephone Encounter (Signed)
Left voicemail message returning patients call. Contact information left on voicemail for patient to return call.              [name], [place] 12/09/2022 11:31

## 2022-12-10 ENCOUNTER — Telehealth (HOSPITAL_BASED_OUTPATIENT_CLINIC_OR_DEPARTMENT_OTHER): Payer: Self-pay

## 2022-12-10 ENCOUNTER — Encounter (HOSPITAL_BASED_OUTPATIENT_CLINIC_OR_DEPARTMENT_OTHER): Payer: Self-pay

## 2022-12-10 NOTE — Telephone Encounter (Signed)
Called and spoke with patient and his wife (speaker). Advised per [facility] the recent labs which showed:     Vitamin-D deficiency, will send replacement with 50000 units weekly for 12 weeks   Normal creatinine   Slightly elevated bilirubin and liver enzymes secondary to cirrhosis   INR 1.2   Low platelet count secondary to cirrhosis normal hemoglobin and white blood cell count   Normal AFP tumor marker   Normal iron studies     I will see Jacob Santiago in the clinic again as previously scheduled to further discuss about the results in-detail. I will formulate further treatment plan at the time.   Patient and wife voiced understanding.  Advised I would also send to them via MyChart.per their request.  [name], LPN      VITAMIN D 25 TOTAL  Order: [id number]  Status: Final result       Visible to patient: Yes (not seen)       Dx: Vitamin D deficiency; Nonalcoholic fa...    1 Result Note      Component  Ref Range & Units 3 d ago   VITAMIN D 25, TOTAL  30.0 - 100.0 ng/mL 17.8 Low    Comment: <20 ng/mL, consistent with vitamin D deficiency  21-29 ng/mL, vitamin D insufficiency  30-100 ng/mL, vitamin D replete  >100 ng/mL, potentially symptomatic vitamin D toxicity  Deficiency states reflect increased risk of bone disease.   Resulting Agency [facility]              Specimen Collected: 12/07/22 10:36 Last Resulted: 12/07/22 12:37             COMPREHENSIVE METABOLIC PANEL, NON-FASTING  Order: [id number]  Status: Final result       Visible to patient: Yes (not seen)       Dx: Nonalcoholic fatty liver disease; Hep...    1 Result Note         Component  Ref Range & Units 3 d ago  (12/07/22) 4 wk ago  (11/11/22) 4 wk ago  (11/11/22) 1 yr ago  (11/06/21)   SODIUM  136 - 145 mmol/L 140  137 137   POTASSIUM  3.5 - 5.1 mmol/L 3.7  4.1 3.7   CHLORIDE  96 - 111 mmol/L 112 High   108 106   CO2 TOTAL  23 - 31 mmol/L 22 Low   22 Low  24   ANION GAP  4 - 13 mmol/L 6  7 7   $ BUN  8 - 25 mg/dL 13  14 13   $ CREATININE  0.75 - 1.35 mg/dL 0.73 Low    0.75 0.79   BUN/CREA RATIO  6 - 22 18  19 16   $ ALBUMIN  3.4 - 4.8 g/dL 3.3 Low  3.4  3.4   CALCIUM  8.6 - 10.3 mg/dL 9.1  8.6 CM 8.1 Low  R   Comment: Gadolinium-containing contrast can interfere with calcium measurement.   GLUCOSE  65 - 125 mg/dL 98  95 R 140 High    ALKALINE PHOSPHATASE  45 - 115 U/L 66 60  54   ALT (SGPT)  10 - 55 U/L 14 16  13   $ AST (SGOT)  8 - 45 U/L 47 High  49 High   62 High    BILIRUBIN TOTAL  0.3 - 1.3 mg/dL 2.3 High  1.9 High  CM  1.8 High  CM   Comment: Naproxen therapy can falsely elevate total bilirubin levels.   PROTEIN  TOTAL  6.0 - 8.0 g/dL 6.5 6.3  6.0 R   ESTIMATED GFR - MALE  >=60 mL/min/BSA >90  >90 CM    Comment: Estimated Glomerular Filtration Rate (eGFR) is calculated using the gender-dependent CKD-EPI (2021) equation, intended for patients 17 years of age and older.    If patient sex is not documented, or if patient sex and gender are incongruent, eGFR results calculated for both male and male will be reported. Recommend correlation and careful interpretation of patient history, keeping in mind that serum creatinine is influenced by hormone therapy.    Stage, GFR, Classification  G1, 90, Normal or High  G2, 60-89, Mildly decreased  G3a, 45-59, Mildly to moderately decreased  G3b, 30-44, Moderately to severely decreased  G4, 15-29, Severely decreased  G5, <15, Kidney failure  In the absence of kidney damage, neither G1 or G2 fulfill criteria for CKD per KDIGO.   Resulting Agency [place] [facility]              Specimen Collected: 12/07/22 10:36 Last Resulted: 12/07/22 12:28             PT/INR  Order: [id number]  Status: Final result       Visible to patient: Yes (not seen)       Dx: Nonalcoholic fatty liver disease; Hep...    1 Result Note        Component  Ref Range & Units 3 d ago 4 wk ago 1 yr ago   INR  0.80 - 1.10 1.26 High  1.34 High  R, CM 1.38 High  R, CM   Resulting Agency [place] [facility]              Narrative  Performed by: [facility] [address]  Coumadin therapy INR  range for Conventional Anticoagulation is 2.0 to 3.0 and for Intensive Anticoagulation 2.5 to 3.5.      Specimen Collected: 12/07/22 10:36 Last Resulted: 12/07/22 12:01             CBC  Order: [id number]  Status: Final result       Visible to patient: Yes (not seen)       Dx: Nonalcoholic fatty liver disease; Hep...    1 Result Note            Component  Ref Range & Units 3 d ago  (12/07/22) 4 wk ago  (11/11/22) 4 wk ago  (11/11/22) 1 mo ago  (11/05/22) 1 mo ago  (11/05/22) 1 yr ago  (11/06/21) 1 yr ago  (11/06/21)   WBC  3.7 - 11.0 x10^3/uL 3.9 3.4 Low  3.4 Low  3.3 Low  3.3 Low  2.5 Low  2.5 Low    RBC  4.50 - 6.10 x10^6/uL 4.19 Low  4.09 Low  4.09 Low  4.20 Low  4.20 Low  3.92 Low  3.92 Low    HGB  13.4 - 17.5 g/dL 13.7 13.8 13.8 14.1 14.1 13.2 Low  13.2 Low    HCT  38.9 - 52.0 % 40.3 39.0 39.0 40.3 40.3 38.6 Low  38.6 Low    MCV  78.0 - 100.0 fL 96.2 95.4 95.4 96.0 96.0 98.5 98.5   MCH  26.0 - 32.0 pg 32.7 High  33.7 High  33.7 High  33.6 High  33.6 High  33.7 High  33.7 High    MCHC  31.0 - 35.5 g/dL 34.0 35.4 35.4 35.0 35.0 34.2 34.2   RDW-CV  11.5 -  15.5 % 13.8 13.6 13.6 13.9 13.9 13.3 13.3   PLATELETS  150 - 400 x10^3/uL 79 Low  80 Low  80 Low  95 Low  95 Low  55 Low  55 Low    MPV  8.7 - 12.5 fL 12.4 12.1 12.1   11.9 11.9   Resulting Agency [place] [facility]              Specimen Collected: 12/07/22 10:36 Last Resulted: 12/07/22 11:51             ALPHA FETOPROTEIN (AFP) TUMOR MARKER  Order: [id number]  Status: Final result       Visible to patient: Yes (not seen)       Dx: Nonalcoholic fatty liver disease; Hep...    1 Result Note      Component  Ref Range & Units 3 d ago   AFP TUMOR MARKER  <=9 ng/mL 3   Comment: AFP Methodology = Chemiluminescent immunoassay performed on Abbott Alinity    Patient results determined by assays using different manufacturers or methods may not be comparable.   Resulting Agency [facility]              Specimen Collected: 12/07/22 10:36 Last Resulted: 12/07/22  13:05             IMMUNOGLOBULIN A (IGA), SERUM  Order: [id number]  Status: Final result       Visible to patient: Yes (not seen)       Dx: Nonalcoholic fatty liver disease; Hep...    1 Result Note      Component  Ref Range & Units 3 d ago   IMMUNOGLOBULIN A (IGA)  101 - 645 mg/dL 241   Resulting Agency [facility]              Specimen Collected: 12/07/22 10:36 Last Resulted: 12/07/22 12:53             FERRITIN  Order: [id number]  Status: Final result       Visible to patient: Yes (not seen)       Dx: Nonalcoholic fatty liver disease; Hep...    1 Result Note       Component  Ref Range & Units 3 d ago 1 mo ago   FERRITIN  20 - 300 ng/mL 111 120 CM   Comment: Circulating ferritin <10 ng/mL suggests iron deficiency anemia.   Resulting Agency [place] [facility]              Specimen Collected: 12/07/22 10:36 Last Resulted: 12/07/22 12:34             IRON TRANSFERRIN AND TIBC  Order: [id number]  Status: Final result       Visible to patient: Yes (not seen)       Dx: Nonalcoholic fatty liver disease; Hep...    1 Result Note       Component  Ref Range & Units 3 d ago 1 mo ago   TOTAL IRON BINDING CAPACITY  224 - 476 ug/dL 371    IRON (TRANSFERRIN) SATURATION  15 - 50 % 38    IRON  55 - 175 ug/dL 140 120   TRANSFERRIN  160 - 340 mg/dL 265    Resulting Agency [place] [facility]              Narrative  Performed by: [place]  In hereditary hemochromatosis, serum iron is usually >150 ug/dL and % saturation  is >60%. In advanced iron overload, % saturation is often >90%.      Specimen Collected: 12/07/22 10:36 Last Resulted: 12/07/22 12:28

## 2022-12-15 ENCOUNTER — Telehealth (HOSPITAL_BASED_OUTPATIENT_CLINIC_OR_DEPARTMENT_OTHER): Payer: Self-pay | Admitting: Student in an Organized Health Care Education/Training Program

## 2022-12-15 NOTE — Telephone Encounter (Signed)
Returned patients call. All questions were answered. Patient will call back if she has any more questions or concerns.           [name], [place] 12/15/2022 14:18

## 2022-12-17 LAB — SMOOTH MUSCLE ANTIBODIES, SERUM: SMOOTH MUSCLE AB SCREEN, S: NEGATIVE

## 2022-12-21 ENCOUNTER — Telehealth (HOSPITAL_BASED_OUTPATIENT_CLINIC_OR_DEPARTMENT_OTHER): Payer: Self-pay

## 2022-12-21 NOTE — Telephone Encounter (Signed)
Someone (couldn't make out name) called from [facility] [place] and left voice message. Requested copy of progress notes and labs from patient's visit 12/07/2022. Copies faxed to [phone]) as requested.   Copy of fax verification sheet sent to be scanned to media.  [name], LPN

## 2022-12-22 ENCOUNTER — Encounter (HOSPITAL_BASED_OUTPATIENT_CLINIC_OR_DEPARTMENT_OTHER): Payer: Self-pay | Admitting: Student in an Organized Health Care Education/Training Program

## 2022-12-25 ENCOUNTER — Other Ambulatory Visit (HOSPITAL_COMMUNITY): Payer: Self-pay | Admitting: Student in an Organized Health Care Education/Training Program

## 2022-12-25 DIAGNOSIS — K760 Fatty (change of) liver, not elsewhere classified: Secondary | ICD-10-CM

## 2023-01-18 ENCOUNTER — Other Ambulatory Visit (HOSPITAL_BASED_OUTPATIENT_CLINIC_OR_DEPARTMENT_OTHER): Payer: Self-pay | Admitting: Student in an Organized Health Care Education/Training Program

## 2023-01-18 ENCOUNTER — Encounter (HOSPITAL_BASED_OUTPATIENT_CLINIC_OR_DEPARTMENT_OTHER): Payer: Self-pay

## 2023-01-18 ENCOUNTER — Inpatient Hospital Stay
Admission: RE | Admit: 2023-01-18 | Discharge: 2023-01-18 | Disposition: A | Discharge status: Home / Self Care | Payer: Medicare Other | Source: Ambulatory Visit | Attending: Student in an Organized Health Care Education/Training Program | Admitting: Student in an Organized Health Care Education/Training Program

## 2023-01-18 ENCOUNTER — Telehealth (HOSPITAL_BASED_OUTPATIENT_CLINIC_OR_DEPARTMENT_OTHER): Payer: Self-pay

## 2023-01-18 ENCOUNTER — Other Ambulatory Visit: Payer: Self-pay

## 2023-01-18 DIAGNOSIS — K7460 Unspecified cirrhosis of liver: Secondary | ICD-10-CM

## 2023-01-18 DIAGNOSIS — K760 Fatty (change of) liver, not elsewhere classified: Secondary | ICD-10-CM | POA: Insufficient documentation

## 2023-01-18 NOTE — Telephone Encounter (Signed)
Called patient, left voice message. Advised calling regarding results from recent Abdominal Ultrasound. Advised I would send results via MyChart for patient to review. Left call back information for any  questions.  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 I will formulate further treatment plan at the time.    Please let me know if Mr.Hsu has additional questions.    Thanks!    Clenton Pare, M.D.  [facility]  [place], [place]  [id number]          US ABDOMEN COMPLETE (ADULT)  Order: [id number]   Status: Final result       Visible to patient: Yes (not seen)       Dx: Nonalcoholic fatty liver disease; Hep...    1 Result Note  Details    Reading Physician Reading Date Result Priority   Garen Lah, MD  [phone] 01/18/2023 Routine     Narrative & Impression  Jehiel Petion     PROCEDURE DESCRIPTION: US ABDOMEN COMPLETE (ADULT)     CLINICAL INDICATION: [id number]: Nonalcoholic fatty liver disease  K74.60: Hepatic cirrhosis, unspecified hepatic cirrhosis type, unspecified whether ascites present (CMS [place])     COMPARISON: No prior studies were compared.        FINDINGS: The spleen is enlarged with a volume of 780.9. 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 No hydronephrosis is noted. No abnormal dilation of the abdominal aorta or proximal common iliac arteries. Pancreas is partially obscured.     There is coarsened echogenicity of the liver which is normal in size. No flow is identified within the portal vein. No gallbladder wall thickening. Some sludge is noted within the gallbladder and  the gallbladder is mildly distended. No common bile duct dilation.     IMPRESSION:  1. No flow identified within the portal vein.  2. Cirrhotic liver with variceal formation.  3. Splenomegaly                    Radiologist location ID: [id number]              Specimen Collected: 01/18/23 08:38 Last Resulted: 01/18/23 08:42

## 2023-01-19 ENCOUNTER — Inpatient Hospital Stay
Admission: RE | Admit: 2023-01-19 | Discharge: 2023-01-19 | Disposition: A | Discharge status: Home / Self Care | Payer: Medicare Other | Source: Ambulatory Visit | Attending: Student in an Organized Health Care Education/Training Program | Admitting: Student in an Organized Health Care Education/Training Program

## 2023-01-19 DIAGNOSIS — K760 Fatty (change of) liver, not elsewhere classified: Secondary | ICD-10-CM | POA: Insufficient documentation

## 2023-01-19 LAB — COMPREHENSIVE METABOLIC PANEL, NON-FASTING
ALBUMIN: 3.2 g/dL — ABNORMAL LOW (ref 3.4–4.8)
ALKALINE PHOSPHATASE: 75 U/L (ref 45–115)
ALT (SGPT): 10 U/L (ref 10–55)
ANION GAP: 6 mmol/L (ref 4–13)
AST (SGOT): 48 U/L — ABNORMAL HIGH (ref 8–45)
BILIRUBIN TOTAL: 1.3 mg/dL (ref 0.3–1.3)
BUN/CREA RATIO: 12 (ref 6–22)
BUN: 9 mg/dL (ref 8–25)
CALCIUM: 8.8 mg/dL (ref 8.6–10.3)
CHLORIDE: 111 mmol/L (ref 96–111)
CO2 TOTAL: 23 mmol/L (ref 23–31)
CREATININE: 0.74 mg/dL — ABNORMAL LOW (ref 0.75–1.35)
ESTIMATED GFR - MALE: >90 mL/min/BSA (ref >=60)
GLUCOSE: 121 mg/dL (ref 65–125)
POTASSIUM: 3.9 mmol/L (ref 3.5–5.1)
PROTEIN TOTAL: 6.2 g/dL (ref 5.6–7.6)
SODIUM: 140 mmol/L (ref 136–145)

## 2023-01-19 LAB — LIPID PANEL
CHOL/HDL RATIO: 3.4
CHOLESTEROL: 162 mg/dL (ref 100–200)
HDL CHOL: 47 mg/dL — ABNORMAL LOW (ref >=50)
LDL CALC: 95 mg/dL (ref <100)
NON-HDL: 115 mg/dL (ref <=190)
TRIGLYCERIDES: 113 mg/dL (ref <150)
VLDL CALC: 18 mg/dL (ref <30)

## 2023-01-19 LAB — PT/INR
INR: 1.21 — ABNORMAL HIGH (ref 0.80–1.20)
PROTHROMBIN TIME: 13.6 seconds — ABNORMAL HIGH (ref 9.2–13.2)

## 2023-01-19 LAB — CBC/DIFF - CLIENT CONSOLIDATED
BASOPHIL #: <0.1 10*3/uL (ref <=0.20)
BASOPHIL %: 1 %
EOSINOPHIL #: <0.1 10*3/uL (ref <=0.50)
EOSINOPHIL %: 3 %
HCT: 39.3 % (ref 38.9–52.0)
HGB: 13.4 g/dL (ref 13.4–17.5)
IMMATURE GRANULOCYTE #: <0.1 10*3/uL (ref <0.10)
IMMATURE GRANULOCYTE %: 0 % (ref 0.0–1.0)
LYMPHOCYTE #: 0.55 10*3/uL — ABNORMAL LOW (ref 1.00–4.80)
LYMPHOCYTE %: 23 %
MCH: 33.7 pg — ABNORMAL HIGH (ref 26.0–32.0)
MCHC: 34.1 g/dL (ref 31.0–35.5)
MCV: 98.7 fL (ref 78.0–100.0)
MONOCYTE #: 0.25 10*3/uL (ref 0.20–1.10)
MONOCYTE %: 10 %
MPV: 11.9 fL (ref 8.7–12.5)
NEUTROPHIL #: 1.51 10*3/uL (ref 1.50–7.70)
NEUTROPHIL %: 63 %
PLATELETS: 71 10*3/uL — ABNORMAL LOW (ref 150–400)
RBC: 3.98 10*6/uL — ABNORMAL LOW (ref 4.50–6.10)
RDW-CV: 14 % (ref 11.5–15.5)
WBC: 2.4 10*3/uL — ABNORMAL LOW (ref 3.7–11.0)

## 2023-01-19 LAB — CBC WITH DIFF
BASOPHIL #: <0.1 10*3/uL (ref <=0.20)
BASOPHIL %: 1 %
EOSINOPHIL #: <0.1 10*3/uL (ref <=0.50)
EOSINOPHIL %: 3 %
HCT: 39.3 % (ref 38.9–52.0)
HGB: 13.4 g/dL (ref 13.4–17.5)
IMMATURE GRANULOCYTE #: <0.1 10*3/uL (ref <0.10)
IMMATURE GRANULOCYTE %: 0 % (ref 0.0–1.0)
LYMPHOCYTE #: 0.55 10*3/uL — ABNORMAL LOW (ref 1.00–4.80)
LYMPHOCYTE %: 23 %
MCH: 33.7 pg — ABNORMAL HIGH (ref 26.0–32.0)
MCHC: 34.1 g/dL (ref 31.0–35.5)
MCV: 98.7 fL (ref 78.0–100.0)
MONOCYTE #: 0.25 10*3/uL (ref 0.20–1.10)
MONOCYTE %: 10 %
MPV: 11.9 fL (ref 8.7–12.5)
NEUTROPHIL #: 1.51 10*3/uL (ref 1.50–7.70)
NEUTROPHIL %: 63 %
PLATELETS: 71 10*3/uL — ABNORMAL LOW (ref 150–400)
RBC: 3.98 10*6/uL — ABNORMAL LOW (ref 4.50–6.10)
RDW-CV: 14 % (ref 11.5–15.5)
WBC: 2.4 10*3/uL — ABNORMAL LOW (ref 3.7–11.0)

## 2023-02-10 ENCOUNTER — Other Ambulatory Visit: Payer: Self-pay | Admitting: Neurology

## 2023-02-10 DIAGNOSIS — G20A1 Parkinson's disease without dyskinesia, without mention of fluctuations: Secondary | ICD-10-CM

## 2023-02-11 ENCOUNTER — Telehealth: Payer: Self-pay | Admitting: Neurology

## 2023-02-11 ENCOUNTER — Other Ambulatory Visit: Payer: Self-pay

## 2023-02-11 DIAGNOSIS — G20A2 Parkinson's disease without dyskinesia, with fluctuations: Secondary | ICD-10-CM

## 2023-02-11 DIAGNOSIS — G20A1 Parkinson's disease without dyskinesia, without mention of fluctuations: Secondary | ICD-10-CM

## 2023-02-11 MED ORDER — CARBIDOPA-LEVODOPA 25-100 MG PO TABS
ORAL_TABLET | ORAL | 0 refills | Status: AC
Start: 2023-02-11 — End: ?

## 2023-02-11 NOTE — Telephone Encounter (Signed)
Medication has been sent called patient and also sent patient a my chart message

## 2023-02-11 NOTE — Telephone Encounter (Signed)
1. Which medications need refilled? (List name and dosage, if known) carbidopa-levodopa 25-100mg  - wants a short supply maybe 5 days worth. The mail order will take too long to get to him before he leaves to go to [place]  2. Which pharmacy/location is medication to be sent to? (include street and city if local pharmacy) [facility], [place] - the only one in that town

## 2023-03-04 ENCOUNTER — Other Ambulatory Visit: Payer: Self-pay

## 2023-03-05 ENCOUNTER — Other Ambulatory Visit: Payer: Self-pay

## 2023-03-05 ENCOUNTER — Inpatient Hospital Stay
Admission: RE | Admit: 2023-03-05 | Discharge: 2023-03-05 | Disposition: A | Discharge status: Home / Self Care | Payer: Medicare Other | Source: Ambulatory Visit | Attending: Student in an Organized Health Care Education/Training Program | Admitting: Student in an Organized Health Care Education/Training Program

## 2023-03-05 ENCOUNTER — Other Ambulatory Visit (HOSPITAL_BASED_OUTPATIENT_CLINIC_OR_DEPARTMENT_OTHER): Payer: Self-pay | Admitting: Student in an Organized Health Care Education/Training Program

## 2023-03-05 DIAGNOSIS — K7460 Unspecified cirrhosis of liver: Secondary | ICD-10-CM | POA: Insufficient documentation

## 2023-03-05 LAB — POC ISTAT CREATININE (RESULT): CREATININE, POC: 0.5 mg/dL (ref 0.00–1.70)

## 2023-03-05 MED ORDER — DIATRIZOATE MEGLUMINE-DIATRIZOATE SODIUM 66 %-10 % ORAL SOLUTION
15.0000 mL | Freq: Once | ORAL | Status: AC | PRN
Start: 2023-03-05 — End: 2023-03-05
  Administered 2023-03-05: 15 mL via ORAL

## 2023-03-05 MED ORDER — SODIUM CHLORIDE 0.9 % INJECTION SOLUTION
10.0000 mL | INTRAMUSCULAR | Status: AC
Start: 2023-03-05 — End: 2023-03-05
  Administered 2023-03-05: 10 mL via INTRAVENOUS

## 2023-03-05 MED ORDER — IOPAMIDOL 300 MG IODINE/ML (61 %) INTRAVENOUS SOLUTION
100.0000 mL | INTRAVENOUS | Status: AC
Start: 2023-03-05 — End: 2023-03-05
  Administered 2023-03-05: 100 mL via INTRAVENOUS

## 2023-03-06 ENCOUNTER — Other Ambulatory Visit (HOSPITAL_BASED_OUTPATIENT_CLINIC_OR_DEPARTMENT_OTHER): Payer: Self-pay | Admitting: Student in an Organized Health Care Education/Training Program

## 2023-03-06 DIAGNOSIS — R911 Solitary pulmonary nodule: Secondary | ICD-10-CM

## 2023-03-06 DIAGNOSIS — K7460 Unspecified cirrhosis of liver: Secondary | ICD-10-CM

## 2023-03-09 ENCOUNTER — Encounter (HOSPITAL_BASED_OUTPATIENT_CLINIC_OR_DEPARTMENT_OTHER): Payer: Self-pay

## 2023-03-10 NOTE — H&P (Deleted)
[facility] Department of Neurosurgery      Operated by [employer]  History and Physical  Date:  03/10/2023  Name: Jacob Santiago  MRN: [id number]  Age:  67 y.o.  Referring Physician: No referring provider defined for this encounter.    PCP:  Carlyle Dolly, MD    Handedness: {NS HANDEDNESS (AMB):[id number]}  Marital Status: {MARITAL STATUS (AMB):18919:x}   Occupation: ***    Mr. Fromm is referred by ***.     Mr. Jewart is here with ***.     History Obtained from:  Patient, Family, and medical chart    CC:  No chief complaint on file.      HPI: Dr. Bennett Scrape had the pleasure of seeing Kayon Broner in the [facility] Movement Disorders clinic as a new patient for surgical evaluation. He has a past medical history of Parkinson's Disease.    Pertinent medical hx:    Surgical hx:     Neurologist: Dr. Daisey Must   Neurosurgeon: Dr. Marland Kitchen    Onset: ***  Location/laterality: ***  Provocation: ***  Alleviating: ***  Impact on ADL's/QOL: ***     Medical treatments:  ***      Tremor ROS:     Motor:  Resting tremor: ***  Rigidity: ***  Freezing: ***  Bradykinesia: ***  Postural instability/increase in falls: ***    Cognitive:   Word finding difficulty: ***  Changes in level of consciousness, cognition or memory: ***    Psychologically:  REM sleep behavior: ***  Vivid dreams: ***  Impulse control: ***  Depression: ***  Visual hallucinations: ***    Dysautonomic:  Orthostatic hypotension: ***  Constipation: ***  Temperature dysregulation: ***  Anosmia: ***      Family history of tremor: ***    Review of Systems  {Ros - complete:30496}    Past Medical History:    Past Medical History:   Diagnosis Date    BPH (benign prostatic hyperplasia)     Esophageal reflux     Hepatic cirrhosis (CMS HCC)     Nonalcoholic fatty liver disease     Parkinson's disease     Thrombocytopenia, unspecified (CMS HCC)            Medications:   No outpatient medications have been marked as taking for the 03/11/23 encounter  (Appointment) with Don Perking, MD.       Allergies: No Known Allergies    Family History:   Family Medical History:    None           Surgical History: {Past Surgical History Negative:25851}        Social History:    Social History     Socioeconomic History    Marital status: Married   Tobacco Use    Smoking status: Never     Passive exposure: Past    Smokeless tobacco: Never         PHYSICAL EXAM:    Constitutional:  There were no vitals taken for this visit.      General:   Well developed  NAD  Eyes:   PERLLA  EOMI   ENT:   No otorrhea or rhinorrhea.   Overall, good dentition.   Cardiovascular:   Lower extremities warm and well perfused. 2+ posterior tibial and radial pulses bilaterally.  Lungs:   Respiratory rate unlabored.   GI/GU:  Abdomen is soft   Psychiatric:  Attention, mood, and concentration all appropriate.   Integumentary:  no rashes or bruising.    Incision ***  Musculoskeletal:  Muscle strength (upper extremities): ***  Muscle strength (lower extremities): ***  Tenderness ***  ROM ***  Neurologic:   Mental Status: A&Ox3, follows commands.   Knowledge: Appropriate  Language: No aphasia  Speech: No dysarthria  Cranial Nerves:  2 No Visual Defect on Confrontation; Pupils round, equal, reactive to light  3,4,6Extraocular Movements Intact; no nystagmus  5 Facial Sensation Intact  7 No facial asymmetry  8 Intact hearing  9,10 Palate symmetric  11 Good shoulder shrug  12 Tongue Midline  Sensory: Intact to light touch.   Gait:    - *** to rise from chair without the use of arms.   -stride - ***  -pace -  ***  -base - ***  -posture -***   - ***tandem gait  Motor:   - Tremor: ***   - Vocal tremor ***  - Head/chin tremor ***   - Tone: ***   - Finger taps ***    - Foot stomps ***    - Coordination ***  (finger to nose testing)    Deep Tendon Reflexes: 1-2+ throughout  - Hoffman: ***  - Clonus ***    Data reviewed  Images and labs have been reviewed with cosigning physician and compared to previous  as available.     Discussion with other providers:   ***    Medicare DBS National Coverage Determination:    PARKINSON'S DISEASE:  A. The patients diagnosis of PD is based on the presence of at least two cardinal of PD (Bradykinesia, rigidity, tremor): {YES/NA/NO:50082923}  B. The patient has been diagnosed with advanced idiopathic PD as determined by UPDRS part III motor subscale with an on/off score of ***      C. The patient symptoms respond to L- dopa with clearly defined "on" periods.  {YES/NA/NO:39128}  D. The patient experiences persistent and disabling symptoms of Parkinson's disease or medication side effects despite optimal medical therapy. {YES/NA/NO:39128}  E. The patient demonstrates the necessary willingness and ability to cooperate during conscious operative procedure, as well as during post-surgical evaluations, adjustments of medications and stimulator settings {YES/NA/NO:39128}    ESSENTIAL TREMOR:   A. The patient has a diagnosis of essential tremor based on postural or kinetic tremors {YES/NA/NO:39128}  B. Using the TETRAS grading scale, the patient has been diagnosed with at least a grade 3 tremor that is causing significant limitation in daily activity despite optimal medical therapies. {YES/NA/NO:39128}  C. The patient demonstrates the necessary willingness and ability to cooperate during conscious operative procedure, as well as during post-surgical evaluations, adjustments of medications and stimulator settings {YES/NA/NO:39128}                 Assessment:    No diagnosis found.    Treatment Plan      On the day of the encounter, a total of  *** minutes was spent on this patient encounter including review of historical information, examination, documentation and post-visit activities. The time documented excludes procedural time.    {Mid-level cosignature requirements:21248}    Fransisco Hertz, PA-C 03/10/2023, 18:28    With Dr. Bennett Scrape

## 2023-03-11 ENCOUNTER — Other Ambulatory Visit: Payer: Self-pay

## 2023-03-11 ENCOUNTER — Ambulatory Visit (HOSPITAL_BASED_OUTPATIENT_CLINIC_OR_DEPARTMENT_OTHER): Payer: Medicare Other | Admitting: Neurological Surgery

## 2023-03-11 ENCOUNTER — Encounter (INDEPENDENT_AMBULATORY_CARE_PROVIDER_SITE_OTHER): Payer: Self-pay | Admitting: Neurology

## 2023-03-11 ENCOUNTER — Ambulatory Visit: Payer: Medicare Other | Attending: Student in an Organized Health Care Education/Training Program | Admitting: Neurology

## 2023-03-11 ENCOUNTER — Encounter (INDEPENDENT_AMBULATORY_CARE_PROVIDER_SITE_OTHER): Payer: Self-pay | Admitting: Neurological Surgery

## 2023-03-11 VITALS — BP 131/77 | HR 81 | Temp 97.4°F | Wt 224.4 lb

## 2023-03-11 DIAGNOSIS — Z029 Encounter for administrative examinations, unspecified: Secondary | ICD-10-CM

## 2023-03-11 DIAGNOSIS — G20A1 Parkinson's disease without dyskinesia, without mention of fluctuations: Secondary | ICD-10-CM | POA: Insufficient documentation

## 2023-03-11 MED ORDER — CARBIDOPA ER 50 MG-LEVODOPA 200 MG TABLET,EXTENDED RELEASE
ORAL_TABLET | ORAL | 3 refills | Status: AC
Start: 2023-03-11 — End: ?

## 2023-03-11 NOTE — Patient Instructions (Addendum)
Medication Regimen  Medication 6am 10am 2pm 6pm 9pm   Sinemet 25-100 mg IR 2 tablets 2 tablets 2 tablets 2 tablets    Sinemet 50-200 mg CR 1 tablet - - - 2 tablets
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
## 2023-03-23 ENCOUNTER — Other Ambulatory Visit: Payer: Self-pay

## 2023-03-23 ENCOUNTER — Ambulatory Visit: Payer: Medicare Other | Attending: Pulmonary Disease | Admitting: Pulmonary Disease

## 2023-03-23 ENCOUNTER — Encounter (HOSPITAL_BASED_OUTPATIENT_CLINIC_OR_DEPARTMENT_OTHER): Payer: Self-pay | Admitting: Pulmonary Disease

## 2023-03-23 DIAGNOSIS — R911 Solitary pulmonary nodule: Secondary | ICD-10-CM | POA: Insufficient documentation

## 2023-03-23 NOTE — Progress Notes (Signed)
The patient did not appear for their appointment/or scheduled appointment was cancelled.  This office visit opened in error.

## 2023-03-24 ENCOUNTER — Telehealth (INDEPENDENT_AMBULATORY_CARE_PROVIDER_SITE_OTHER): Payer: Self-pay

## 2023-03-24 ENCOUNTER — Encounter (INDEPENDENT_AMBULATORY_CARE_PROVIDER_SITE_OTHER): Payer: Self-pay

## 2023-03-24 NOTE — Telephone Encounter (Signed)
03/24/23 Lvm asking for call back to schedule  MSM

## 2023-03-24 NOTE — Progress Notes (Signed)
[facility]  [address], [address]  [place] [address]  Phone: [phone]  Fax: [phone]    NAME: Jacob Santiago  DOB:  1956-10-06  AGE:  67 y.o.  MRN:  [id number]  APPT:  03/23/2023 12:00 PM EDT    Primary Care Physician: Carlyle Dolly, MD   Referring Physician: Joslyn Devon, MD  [address]  [address]  [place],  [place] [address]    Reason for Referral:  Pulmonary nodule      History of Present Illness    A very pleasant 67 year old gentleman who presents to the [facility] for evaluation of incidental pulmonary nodule.    Patient carries a history of Parkinson's disease as well as NASH related cirrhosis of the liver.  He underwent a CT of the abdomen and pelvis on 03/05/2023 which revealed a partially solid/partially ground-glass roughly 10 mm nodule in what appears to be the peripheral lingula.  No prior CT for comparison views.    He is scheduled to have a formal chest CT on 04/22/2023 which has been previously ordered by his GI physician  Clinically he is asymptomatic as relates to this nodule not surprisingly.    The patient does describe episodes of dysphagia in the context of his Parkinson's disease.    He has a never smoker.      Past Medical History:   Past Medical History:   Diagnosis Date    BPH (benign prostatic hyperplasia)     Esophageal reflux     Hepatic cirrhosis (CMS HCC)     Nonalcoholic fatty liver disease     Parkinson's disease (CMS HCC)     Thrombocytopenia, unspecified (CMS HCC)            Social history:   Social History     Tobacco Use   Smoking Status Never    Passive exposure: Past   Smokeless Tobacco Never       Family Medical History:    None           Allergies:  Nirmatrelvir-ritonavir and Simvastatin    Medications:  Current Outpatient Medications   Medication Sig    carbidopa-levodopa (SINEMET CR) 50-200 mg Oral Tablet Sustained Release 1 tablet qAM and 2 tablets qPM    carbidopa-Levodopa (SINEMET) 25-100 mg Oral Tablet TAKE 3 TABLETS BY  MOUTH THREE TIMES DAILY    carvediloL (COREG) 6.25 mg Oral Tablet Take 1 Tablet (6.25 mg total) by mouth Twice daily for 90 days    lactulose (ENULOSE) 10 gram/15 mL Oral Solution Take 30 mL by mouth Three times a day Indications: impaired brain function due to liver disease    pantoprazole (PROTONIX) 40 mg Oral Tablet, Delayed Release (E.C.) Take 1 tablet every day by oral route as needed.    tamsulosin (FLOMAX) 0.4 mg Oral Capsule Take 1 capsule every day by oral route.       PHYSICAL EXAM:   BP 99/67   Pulse 70   Temp 36.9 C (98.4 F)   Ht 1.727 m (5\' 8" )   Wt 101 kg (222 lb 14.2 oz)   SpO2 95%   BMI 33.89 kg/m         Gen: No distress  Respiratory: Symmetric chest expansion.    Neurological:  Awake and alert    DATA:  I personally reviewed CT abdomen from 03/05/2023       ASSESSMENT / PLAN:    * 1 cm LEFT LUNG (PERIPHERAL LINGULA)  SEMI SOLID-GROUND-GLASS NODULE    (03/24/2023):  An incidental finding on CT of the abdomen.  He is a never smoker.  He does have risk for aspiration pneumonitis in the context of dysphagia related to Parkinson's disease.  He is scheduled to have a formal CT chest on 04/22/2023.  This has already been ordered and scheduled as per his GI physician.  Based upon findings of the formal chest CT we will determine whether biopsy is needed moving forward versus serial surveillance.  Risk of malignancy is low but not zero in this never smoker.  Further recommendations post formal chest CT.  Otherwise follow up in 3 months.      Return to clinic in 3 months for follow-up or sooner if any issues arise.    45 minutes spent on the day of evaluation including pre visit preparation, face-to-face time and EMR documentation.    Dorene Ar, MD, FCCP  PULMONARY / CCM / SLEEP MEDICINE  [facility]

## 2023-03-30 ENCOUNTER — Encounter (HOSPITAL_COMMUNITY): Payer: Self-pay | Admitting: Student in an Organized Health Care Education/Training Program

## 2023-03-31 ENCOUNTER — Other Ambulatory Visit (HOSPITAL_BASED_OUTPATIENT_CLINIC_OR_DEPARTMENT_OTHER): Payer: Self-pay | Admitting: Student in an Organized Health Care Education/Training Program

## 2023-03-31 MED ORDER — PEG 3350-ELECTROLYTES 236 GRAM-22.74 GRAM-6.74 GRAM-5.86 GRAM SOLUTION
4.0000 L | Freq: Once | ORAL | 0 refills | Status: DC
Start: 2023-03-31 — End: 2023-04-06

## 2023-03-31 MED ORDER — BISACODYL 5 MG TABLET,DELAYED RELEASE
DELAYED_RELEASE_TABLET | ORAL | 0 refills | Status: DC
Start: 2023-03-31 — End: 2024-04-05

## 2023-04-01 ENCOUNTER — Telehealth (INDEPENDENT_AMBULATORY_CARE_PROVIDER_SITE_OTHER): Payer: Self-pay

## 2023-04-01 NOTE — Telephone Encounter (Signed)
04/01/23 Lvm asking for call back to schedule MSM

## 2023-04-06 ENCOUNTER — Ambulatory Visit: Payer: Medicare Other | Admitting: Neurology

## 2023-04-06 ENCOUNTER — Encounter (HOSPITAL_COMMUNITY)
Admission: RE | Disposition: A | Discharge status: Home / Self Care | Payer: Self-pay | Source: Ambulatory Visit | Attending: Student in an Organized Health Care Education/Training Program

## 2023-04-06 ENCOUNTER — Ambulatory Visit (HOSPITAL_COMMUNITY): Payer: Medicare Other | Admitting: Certified Registered"

## 2023-04-06 ENCOUNTER — Other Ambulatory Visit: Payer: Self-pay

## 2023-04-06 ENCOUNTER — Encounter (HOSPITAL_COMMUNITY): Payer: Self-pay | Admitting: Student in an Organized Health Care Education/Training Program

## 2023-04-06 ENCOUNTER — Ambulatory Visit (HOSPITAL_BASED_OUTPATIENT_CLINIC_OR_DEPARTMENT_OTHER): Payer: Medicare Other | Admitting: Certified Registered"

## 2023-04-06 ENCOUNTER — Inpatient Hospital Stay
Admission: RE | Admit: 2023-04-06 | Discharge: 2023-04-06 | Disposition: A | Discharge status: Home / Self Care | Payer: Medicare Other | Source: Ambulatory Visit | Attending: Student in an Organized Health Care Education/Training Program | Admitting: Student in an Organized Health Care Education/Training Program

## 2023-04-06 DIAGNOSIS — I868 Varicose veins of other specified sites: Secondary | ICD-10-CM | POA: Insufficient documentation

## 2023-04-06 DIAGNOSIS — K644 Residual hemorrhoidal skin tags: Secondary | ICD-10-CM

## 2023-04-06 DIAGNOSIS — K641 Second degree hemorrhoids: Secondary | ICD-10-CM

## 2023-04-06 DIAGNOSIS — I8510 Secondary esophageal varices without bleeding: Secondary | ICD-10-CM | POA: Insufficient documentation

## 2023-04-06 DIAGNOSIS — K635 Polyp of colon: Secondary | ICD-10-CM

## 2023-04-06 DIAGNOSIS — G20A1 Parkinson's disease without dyskinesia, without mention of fluctuations: Secondary | ICD-10-CM | POA: Insufficient documentation

## 2023-04-06 DIAGNOSIS — R911 Solitary pulmonary nodule: Secondary | ICD-10-CM | POA: Insufficient documentation

## 2023-04-06 DIAGNOSIS — I10 Essential (primary) hypertension: Secondary | ICD-10-CM | POA: Insufficient documentation

## 2023-04-06 DIAGNOSIS — K219 Gastro-esophageal reflux disease without esophagitis: Secondary | ICD-10-CM

## 2023-04-06 DIAGNOSIS — G4730 Sleep apnea, unspecified: Secondary | ICD-10-CM | POA: Insufficient documentation

## 2023-04-06 DIAGNOSIS — K3189 Other diseases of stomach and duodenum: Secondary | ICD-10-CM

## 2023-04-06 DIAGNOSIS — N400 Enlarged prostate without lower urinary tract symptoms: Secondary | ICD-10-CM | POA: Insufficient documentation

## 2023-04-06 DIAGNOSIS — K766 Portal hypertension: Secondary | ICD-10-CM

## 2023-04-06 DIAGNOSIS — Z1211 Encounter for screening for malignant neoplasm of colon: Secondary | ICD-10-CM

## 2023-04-06 DIAGNOSIS — Z79899 Other long term (current) drug therapy: Secondary | ICD-10-CM | POA: Insufficient documentation

## 2023-04-06 DIAGNOSIS — K7460 Unspecified cirrhosis of liver: Secondary | ICD-10-CM | POA: Insufficient documentation

## 2023-04-06 HISTORY — DX: Essential (primary) hypertension: I10

## 2023-04-06 HISTORY — DX: Sleep apnea, unspecified: G47.30

## 2023-04-06 HISTORY — DX: Dependence on other enabling machines and devices: Z99.89

## 2023-04-06 HISTORY — PX: COLONOSCOPY: WVUENDOPRO10

## 2023-04-06 SURGERY — GASTROSCOPY
Anesthesia: General | Wound class: Clean Contaminated Wounds-The respiratory, GI, Genital, or urinary

## 2023-04-06 MED ORDER — LACTATED RINGERS INTRAVENOUS SOLUTION
INTRAVENOUS | Status: DC
Start: 2023-04-06 — End: 2023-04-06

## 2023-04-06 MED ORDER — SODIUM CHLORIDE 0.9 % (FLUSH) INJECTION SYRINGE
3.0000 mL | INJECTION | INTRAMUSCULAR | Status: DC | PRN
Start: 2023-04-06 — End: 2023-04-06
  Administered 2023-04-06: 3 mL

## 2023-04-06 MED ORDER — LIDOCAINE (PF) 100 MG/5 ML (2 %) INTRAVENOUS SYRINGE
INJECTION | Freq: Once | INTRAVENOUS | Status: DC | PRN
Start: 2023-04-06 — End: 2023-04-06
  Administered 2023-04-06: 100 mg via INTRAVENOUS

## 2023-04-06 MED ORDER — PROPOFOL 10 MG/ML IV BOLUS
INJECTION | INTRAVENOUS | Status: AC
Start: 2023-04-06 — End: 2023-04-06
  Filled 2023-04-06: qty 20

## 2023-04-06 MED ORDER — PROPOFOL 10 MG/ML IV BOLUS
INJECTION | Freq: Once | INTRAVENOUS | Status: DC | PRN
Start: 2023-04-06 — End: 2023-04-06
  Administered 2023-04-06: 100 mg via INTRAVENOUS

## 2023-04-06 MED ORDER — PROPOFOL 10 MG/ML IV - CHI
INTRAVENOUS | Status: DC | PRN
Start: 2023-04-06 — End: 2023-04-06
  Administered 2023-04-06: 0 ug/kg/min via INTRAVENOUS
  Administered 2023-04-06: 150 ug/kg/min via INTRAVENOUS

## 2023-04-06 MED ORDER — SODIUM CHLORIDE 0.9 % (FLUSH) INJECTION SYRINGE
3.0000 mL | INJECTION | Freq: Three times a day (TID) | INTRAMUSCULAR | Status: DC
Start: 2023-04-06 — End: 2023-04-06

## 2023-04-06 MED ORDER — SIMETHICONE 40 MG/0.6 ML ORAL DROPS,SUSPENSION
Freq: Once | ORAL | Status: DC | PRN
Start: 2023-04-06 — End: 2023-04-06
  Administered 2023-04-06: 40 mg via ORAL

## 2023-04-06 SURGICAL SUPPLY — 22 items
BLOCK BITE 60FR ADULT STRAP FLXB SH LF  DISP (ENDOSCOPIC SUPPLIES) ×1 IMPLANT
BRUSH CYTO 1.5MM 140CM BLT TIP ERG HNDL ATO STP SHEATH CLBR STRL DISP PTFE BRONCHSCP (ENDOSCOPIC SUPPLIES) IMPLANT
CATH ELHMST INJ GLD PROBE 7FR 25GA .24MM 210CM BIPOLAR RND DIST TIP STD CONN INTGR DISP 2.8MM MN WRK (ENDOSCOPIC SUPPLIES) IMPLANT
CLIP HMST MR CONDITIONAL BRD CATH ROT CONTROL KNOB NO SHEATH RSL 360 235CM 2.8MM 11MM OPN (ENDOSCOPIC SUPPLIES) IMPLANT
FORCEPS BIOPSY HOT 240CM 2.2MM RJ 4 +2.8MM DISP (ENDOSCOPIC SUPPLIES)
FORCEPS BIOPSY HOT 240CM 2.2MM RJ 4 +2.8MM DISPO (ENDOSCOPIC SUPPLIES) IMPLANT
FORCEPS BIOPSY LRG CPC NEEDLE 240CM 2.2MM RJ 3 DISP ORNG (ENDOSCOPIC SUPPLIES) ×2 IMPLANT
FORCEPS BIOPSY NEEDLE 160CM 1.8MM RJ 4 DISP YW 2MM WRK CHNL GSPED (MED SURG SUPPLIES) IMPLANT
FORCEPS BIOPSY NEEDLE 240CM RJ 4 JMB (ENDOSCOPIC SUPPLIES) IMPLANT
GW ENDOSCOPIC .035IN 260CM DREAMWIRE ANG RX EGLD NITINOL BIL STRL DISP (ENDOSCOPIC SUPPLIES) IMPLANT
KIT RM TURNOVER STPC DISP (DRAPE/PACKS/SHEETS/OR TOWEL) ×1 IMPLANT
LIGATOR 2.8MM 8.6-11.5MM SSS7 ESOPH 1 STNG MLT BAND HNDL STRL DISP ENDOS HMSTS LF (ENDOSCOPIC SUPPLIES) IMPLANT
MARKER ENDOS PMT ENDOMARK DIL INDIA INK 10ML STRL (MED SURG SUPPLIES) IMPLANT
MARKER ENDOS SPOT EX PERM IND DRK SYRG 5ML (MED SURG SUPPLIES) IMPLANT
NEEDLE SCLRTX 25GA 2.3MM BVL STRL DISP STAR CATH INTJCT 4MM 240CM (ENDOSCOPIC SUPPLIES) IMPLANT
NET SPEC RETR 230CM 2.5MM RTHNT STD SHEATH 6X3CM NONST LF  DISP (ENDOSCOPIC SUPPLIES) IMPLANT
PROBE ESURG 220CM 2.3MM FIAPC FLXB STR FIRE STRL DISP (SURGICAL CUTTING SUPPLIES) IMPLANT
SNARE 230CM 2.5MM 3CM PLPK ROTR RTHNT ENDOS NONST LF  DISP (ENDOSCOPIC SUPPLIES) IMPLANT
SNARE MED OVAL 240CM 2.4MM CAPTIVATR STF ENDOS PLYP 27MM DISP (ENDOSCOPIC SUPPLIES) IMPLANT
SOCK CAN MEDIVAC ASCP SPECI CNVRT NONST LF (MED SURG SUPPLIES) IMPLANT
TRAP LL CONN REM SCRN INSTREAM SPECI POLYPROP DISP (SURGICAL INSTRUMENTS) IMPLANT
TRAY GASTRIC LAV 36IN 34FR ARGYLE EDLICH MONOJECT LRG PVC 4 EYE CLS END GRAD SYRG TRNSPR 140CC NONST (MED SURG SUPPLIES) IMPLANT

## 2023-04-06 NOTE — H&P (Addendum)
[facility]  Operating Room H&P/Update    Name: Jacob Santiago  Age: 67 y.o.    Primary Care Provider:  Carlyle Dolly, MD    HPI:    Mr.Jacob Santiago is a pleasent 67 y.o. 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 There is no change in interval history from prior visit.     Review of Systems:  Constitutional: Denies fevers, chills  Cardiovascular: Denies chest pain  Neurological: Denies seizures    Past Medical History  Past Medical History:   Diagnosis Date    BPH (benign prostatic hyperplasia)     CPAP (continuous positive airway pressure) dependence     Esophageal reflux     Essential hypertension     Hepatic cirrhosis (CMS HCC)     Nonalcoholic fatty liver disease     Parkinson's disease (CMS HCC)     Sleep apnea     Thrombocytopenia, unspecified (CMS HCC)          Past Surgical History:   Procedure Laterality Date    HX COLONOSCOPY      HX HAND SURGERY      HX HERNIA REPAIR Right     mesh         Family Medical History:    None         Social History     Socioeconomic History    Marital status: Married   Tobacco Use    Smoking status: Never     Passive exposure: Past    Smokeless tobacco: Never   Vaping Use    Vaping status: Never Used   Substance and Sexual Activity    Alcohol use: Never    Drug use: Never     @MEDSTAKING @  Allergies    Allergen Reactions    Nirmatrelvir-Ritonavir Rash    Simvastatin Nausea/ Vomiting     Caused numbness       Examination:  BP 120/76   Pulse 66   Temp 36.2 C (97.2 F)   Resp 18   Ht 1.727 m (5\' 8" )   Wt 97.1 kg (214 lb)   SpO2 97%   BMI 32.54 kg/m        Wt Readings from Last 2 Encounters:   04/06/23 97.1 kg (214 lb)   03/23/23 101 kg (222 lb 14.2 oz)     Head: Atraumatic and normocephalic  Eyes: Conjunctiva clear, sclera non-icteric  Neck:  Supple  Lungs: Normal reparatory effort bilaterally  Cardiovascular: Regular rate  Neurologic: Alert,  Grossly normal  Psychiatric: Appears normal    CBC Results Coags Results   No results for input(s): "WBC", "HGB", "HCT", "PLTCNT", "BANDS" in the last 72 hours.    Invalid input(s): "PLATELETCOUNT" No results for input(s): "INR", "PROTHROMTME", "APTT" in the last 72 hours.    Invalid input(s): "PTT", "PT", "CREACTPROTIE"   BMP Results Other Chemistries Results  No results for input(s): "SODIUM", "POTASSIUM", "CHLORIDE", "CO2", "BUN", "CREATININE", "GFR", "ANIONGAP" in the last 72 hours. No results for input(s): "CALCIUM", "ALBUMIN", "MAGNESIUM", "PHOSPHORUS" in the last 72 hours.   Liver/Pancreas Enzyme Results Blood Gas     No results for input(s): "TOTALPROTEIN", "ALBUMIN", "PREALBUMIN", "AST", "ALT", "ALKPHOS", "LDH", "AMYLASE", "LIPASE" in the last 72 hours.    Invalid input(s): "GGT" No results found for this encounter     Assessment  Jacob Santiago is a 67 y.o. male is seen in the endoscopy unit for endoscopic procedures.    Plan  Informed consent was obtained.    Discussed with patient the consent for EGD/Colonoscopy. Alternative to endoscopy were also discussed. The risks/benefits and complications including but not limited to anesthesia, bleeding, dental injury, aspiration, CV/pulmonary risk, perforation, infection, missed lesions, need for surgery & remote possibility of death were discussed with patient who understand and agreed to proceed.       Proceed to  endoscopy procedure as previously planned.     Joslyn Devon, MD  [facility] - Gastroenterology  [place], [place]  [address]

## 2023-04-06 NOTE — Discharge Instructions (Signed)
Discharge instructions reviewed with patient. Handout given.     Hospital Operator [phone]    Signs of infection can include but are not limited to:  -Fever, Chills  -Nausea, Vomiting  -Severe pain not relieved by prescribed medication(s)  -Redness,swelling, warmth of surgical site  -Abnormal or foul smelling drainage or discharge.     Circulation issues can include but are not limited to:  - New onset numbness, tingling, pain, swelling  - Coldness of extremity   - Change in color    Return to the ER if any of the above or below occur:  - New onset shortness of breath   - Chest pain  - Bleeding

## 2023-04-06 NOTE — Anesthesia Postprocedure Evaluation (Signed)
Anesthesia Post Op Evaluation    Patient: Jacob Santiago  Procedure(s):  GASTROSCOPY  COLONOSCOPY    Last Vitals:Temperature: 36.2 C (97.2 F) (04/06/23 1152)  Heart Rate: 60 (04/06/23 1429)  BP (Non-Invasive): 112/63 (04/06/23 1429)  Respiratory Rate: 16 (04/06/23 1429)  SpO2: 100 % (04/06/23 1429)    No notable events documented.    Patient is sufficiently recovered from the effects of anesthesia to participate in the evaluation and has returned to their pre-procedure level.  Patient location during evaluation: bedside       Patient participation: complete - patient participated  Level of consciousness: awake and alert and responsive to verbal stimuli    Pain score: 0  Pain management: adequate  Airway patency: patent    Anesthetic complications: no  Cardiovascular status: acceptable  Respiratory status: acceptable  Hydration status: acceptable  Patient post-procedure temperature: Pt Normothermic   PONV Status: Absent

## 2023-04-06 NOTE — Anesthesia Preprocedure Evaluation (Addendum)
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 One of these dilated varices appears to be retroaortic and connects with the IVC, GERD and liver disease (cirrhosis)        Endo/Other    obesity,      Neuro/Psych/MS    neuromuscular disease (parkinson's)     Cancer    negative hematology/oncology ROS,                     Physical Assessment      Airway       Mallampati: III    TM distance: <3 FB    Neck ROM: limited  Mouth Opening: fair.            Dental           (+) caps           Pulmonary    Comment: Lung nodule  Breath sounds clear to auscultation  (-) no rhonchi, no decreased breath sounds, no wheezes, no rales and no stridor     Cardiovascular    Rhythm: regular  Rate: Normal  (-) no friction rub, carotid bruit is not present, no peripheral edema and no murmur     Other findings              Plan  ASA 3     Planned anesthesia type: general     total intravenous anesthesia                    Intravenous induction     Anesthesia issues/risks discussed are: Dental Injuries, Eye /Visual Loss, Post-op Intubation/Ventilation, Nerve Injuries, PONV, Post-op Cognitive Dysfunction, Post-op Agitation/Tantrum, Post-op Pain Management, Cardiac Events/MI, Blood Loss, Stroke, Intraoperative Awareness/ Recall, Aspiration, Difficult Airway and Sore Throat.  Anesthetic plan and risks discussed with patient  signed consent obtained          Patient's NPO status is appropriate for Anesthesia.           Plan discussed with CRNA.

## 2023-04-07 ENCOUNTER — Encounter (HOSPITAL_BASED_OUTPATIENT_CLINIC_OR_DEPARTMENT_OTHER): Payer: Self-pay

## 2023-04-07 ENCOUNTER — Telehealth (HOSPITAL_BASED_OUTPATIENT_CLINIC_OR_DEPARTMENT_OTHER): Payer: Self-pay

## 2023-04-07 NOTE — Telephone Encounter (Signed)
Called patient and left voice message. Advised calling regarding recent US results.   Requested patient return call. Left call back information.   I am sending results via MyChart for patient to review.  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 I will formulate further treatment plan at the time.     Please let me know if Mr.Murcia has additional questions.     Thanks!     Joslyn Devon, M.D.  [facility] - Gastroenterology  [place], [place]  [address]       US ABDOMEN COMPLETE (ADULT)  Order: [id number]   Status: Edited Result - FINAL       Visible to patient: Yes (seen)       Dx: Nonalcoholic fatty liver disease; Hep...    1 Result Note  Details    Reading Physician Reading Date Result Priority   Doreen Beam, MD  [phone] 01/18/2023 Routine     Addenda  ADDENDUM:Stones and sludge noted within the gallbladder, which is mildly distended. No wall thickening. If there is any concern for acute cholecystitis, recommend HIDA scan.              Radiologist location ID: [id number]     Signed by Doreen Beam, MD on 01/19/2023 08:34  Narrative & Impression  Renly Broadhead     PROCEDURE DESCRIPTION: US ABDOMEN COMPLETE (ADULT)     CLINICAL INDICATION: K76.0: Nonalcoholic fatty liver disease  O53.66: Hepatic cirrhosis, unspecified hepatic cirrhosis type, unspecified whether ascites present (CMS HCC)     COMPARISON: No prior studies were compared.        FINDINGS: The spleen is enlarged with a volume of 780.9. 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 No hydronephrosis is noted. No abnormal dilation of the  abdominal aorta or proximal common iliac arteries. Pancreas is partially obscured.     There is coarsened echogenicity of the liver which is normal in size. No flow is identified within the portal vein. No gallbladder wall thickening. Some sludge is noted within the gallbladder and the gallbladder is mildly distended. No common bile duct dilation.     IMPRESSION:  1. No flow identified within the portal vein.  2. Cirrhotic liver with variceal formation.  3. Splenomegaly                    Radiologist location ID: [id number]              Specimen Collected: 01/18/23 08:38 Last Resulted: 01/19/23 08:34

## 2023-04-08 LAB — SURGICAL PATHOLOGY SPECIMEN

## 2023-04-22 ENCOUNTER — Inpatient Hospital Stay
Admission: RE | Admit: 2023-04-22 | Discharge: 2023-04-22 | Disposition: A | Discharge status: Home / Self Care | Payer: Medicare Other | Source: Ambulatory Visit | Attending: Student in an Organized Health Care Education/Training Program | Admitting: Student in an Organized Health Care Education/Training Program

## 2023-04-22 ENCOUNTER — Other Ambulatory Visit: Payer: Self-pay

## 2023-04-22 ENCOUNTER — Other Ambulatory Visit (HOSPITAL_BASED_OUTPATIENT_CLINIC_OR_DEPARTMENT_OTHER): Payer: Self-pay | Admitting: Student in an Organized Health Care Education/Training Program

## 2023-04-22 DIAGNOSIS — R911 Solitary pulmonary nodule: Secondary | ICD-10-CM | POA: Insufficient documentation

## 2023-04-22 LAB — POC ISTAT CREATININE (RESULT): CREATININE, POC: 0.6 mg/dL (ref 0.00–1.70)

## 2023-04-22 MED ORDER — IOPAMIDOL 300 MG IODINE/ML (61 %) INTRAVENOUS SOLUTION
100.0000 mL | INTRAVENOUS | Status: AC
Start: 2023-04-22 — End: 2023-04-22
  Administered 2023-04-22: 100 mL via INTRAVENOUS

## 2023-04-23 ENCOUNTER — Ambulatory Visit: Payer: Medicare Other | Admitting: Neurology

## 2023-04-30 ENCOUNTER — Other Ambulatory Visit (HOSPITAL_BASED_OUTPATIENT_CLINIC_OR_DEPARTMENT_OTHER): Payer: Self-pay | Admitting: Student in an Organized Health Care Education/Training Program

## 2023-04-30 DIAGNOSIS — K7682 Hepatic encephalopathy (CMS HCC): Secondary | ICD-10-CM

## 2023-05-01 MED ORDER — LACTULOSE 10 GRAM/15 ML ORAL SOLUTION
30.0000 mL | Freq: Three times a day (TID) | ORAL | 2 refills | Status: DC
Start: 2023-05-01 — End: 2024-03-06

## 2023-05-07 ENCOUNTER — Other Ambulatory Visit: Payer: Self-pay | Admitting: Neurology

## 2023-05-07 DIAGNOSIS — G20A1 Parkinson's disease without dyskinesia, without mention of fluctuations: Secondary | ICD-10-CM

## 2023-05-07 DIAGNOSIS — G20A2 Parkinson's disease without dyskinesia, with fluctuations: Secondary | ICD-10-CM

## 2023-05-14 ENCOUNTER — Other Ambulatory Visit (HOSPITAL_BASED_OUTPATIENT_CLINIC_OR_DEPARTMENT_OTHER): Payer: Self-pay | Admitting: Student in an Organized Health Care Education/Training Program

## 2023-05-14 DIAGNOSIS — R911 Solitary pulmonary nodule: Secondary | ICD-10-CM

## 2023-05-18 ENCOUNTER — Telehealth (HOSPITAL_BASED_OUTPATIENT_CLINIC_OR_DEPARTMENT_OTHER): Payer: Self-pay

## 2023-05-18 ENCOUNTER — Other Ambulatory Visit: Payer: Self-pay

## 2023-05-18 ENCOUNTER — Ambulatory Visit
Payer: Medicare Other | Attending: Student in an Organized Health Care Education/Training Program | Admitting: Pulmonary Disease

## 2023-05-18 ENCOUNTER — Encounter (INDEPENDENT_AMBULATORY_CARE_PROVIDER_SITE_OTHER): Payer: Self-pay

## 2023-05-18 ENCOUNTER — Telehealth (INDEPENDENT_AMBULATORY_CARE_PROVIDER_SITE_OTHER): Payer: Self-pay

## 2023-05-18 VITALS — BP 143/76 | HR 53 | Temp 97.5°F | Ht 68.0 in | Wt 224.0 lb

## 2023-05-18 DIAGNOSIS — R911 Solitary pulmonary nodule: Secondary | ICD-10-CM | POA: Insufficient documentation

## 2023-05-18 NOTE — Progress Notes (Signed)
[facility]  [address]  [place] [address]  Phone: [phone]  Fax: [phone]    NAME: Jacob Santiago  DOB:  Aug 27, 1956  AGE:  67 y.o.  MRN:  [id number]  APPT:  05/18/2023  2:20 PM EDT    Primary Care Physician: Carlyle Dolly, MD   Referring Physician: Joslyn Devon, MD  [address]  [address]  [place],  [place] [address]    Reason for Referral:  Pulmonary nodule      History of Present Illness    A very pleasant 67 year old gentleman who presents to the [facility] for evaluation of incidental pulmonary nodule.    Patient carries a history of Parkinson's disease as well as NASH related cirrhosis of the liver.  He underwent a CT of the abdomen and pelvis on 03/05/2023 which revealed a partially solid/partially ground-glass roughly 10 mm nodule in what appears to be the peripheral lingula.  No prior CT for comparison views.    He is scheduled to have a formal chest CT on 04/22/2023 which has been previously ordered by his GI physician  Clinically he is asymptomatic as relates to this nodule not surprisingly.    The patient does describe episodes of dysphagia in the context of his Parkinson's disease.    He has a never smoker.  =======================  Interval history:  Presents for follow up   Generally feeling pretty well    Past Medical History:   Past Medical History:   Diagnosis Date    BPH (benign prostatic hyperplasia)     CPAP (continuous positive airway pressure) dependence     Esophageal reflux     Essential hypertension     Hepatic cirrhosis (CMS HCC)     Nonalcoholic fatty liver disease     Parkinson's disease (CMS HCC)     Sleep apnea     Thrombocytopenia, unspecified (CMS HCC)            Social history:   Social History     Tobacco Use   Smoking Status Never    Passive exposure: Past   Smokeless Tobacco Never       Family Medical History:    None           Allergies:  Nirmatrelvir-ritonavir and Simvastatin    Medications:  Current Outpatient Medications    Medication Sig    bisacodyL (DULCOLAX) 5 mg Oral Tablet, Delayed Release (E.C.) TAKE ALL FOUR TABLETS AT 5 PM EVENING PRIOR TO PROCEDURE    carbidopa-levodopa (SINEMET CR) 50-200 mg Oral Tablet Sustained Release 1 tablet qAM and 2 tablets qPM    carbidopa-Levodopa (SINEMET) 25-100 mg Oral Tablet TAKE 3 TABLETS BY MOUTH THREE TIMES DAILY    carvediloL (COREG) 6.25 mg Oral Tablet Take 1 Tablet (6.25 mg total) by mouth Twice daily for 90 days    lactulose (ENULOSE) 10 gram/15 mL Oral Solution Take 30 mL by mouth Three times a day Indications: impaired brain function due to liver disease    pantoprazole (PROTONIX) 40 mg Oral Tablet, Delayed Release (E.C.) Take 1 tablet every day by oral route as needed.    tamsulosin (FLOMAX) 0.4 mg Oral Capsule Take 1 capsule every day by oral route.       PHYSICAL EXAM:   There were no vitals taken for this visit.        Gen: No distress  Respiratory: Symmetric chest expansion.    Neurological:  Awake and alert  DATA:  I personally reviewed CT abdomen from 03/05/2023       ASSESSMENT / PLAN:    * 1 cm LEFT LUNG (PERIPHERAL LINGULA) SEMI SOLID-GROUND-GLASS NODULE    (03/24/2023):  An incidental finding on CT of the abdomen.  He is a never smoker.  He does have risk for aspiration pneumonitis in the context of dysphagia related to Parkinson's disease.  He is scheduled to have a formal CT chest on 04/22/2023.  This has already been ordered and scheduled as per his GI physician.  Based upon findings of the formal chest CT we will determine whether biopsy is needed moving forward versus serial surveillance.  Risk of malignancy is low but not zero in this never smoker.  Further recommendations post formal chest CT.  Otherwise follow up in 3 months.    (05/18/2023):  Patient returns after follow up CT chest on 04/22/2023.  I reviewed the CT scan side-by-side with the patient and his wife in addition to comparison to the prior lung windows of CT abdomen of 03/05/2023.  The semi solid/semi  ground-glass nodule is unchanged in the left upper lung zone region over the course of approximately 6 weeks.  Today in the office we discussed the nature of semi solid/ground-glass nodules.  We discussed the fact that this nodule could represent inflammation in which case it was likely to resolve over the next couple of months.  We also discussed the possibility that this nodule could represent cancer at an early stage.  The solid component of this lesion is very small at 5 mm.  Radiology report recommends consideration of PET scan.  I discussed with the patient and his wife today in the office a number of potential options as relates to evaluation moving forward.  From a more conservative standpoint and at the very least would suggest repeat unenhanced CT chest in 3 months for follow-up.  More aggressive approach would include PET scan at this point in time.  Given the small nature of the solid component in the ground-glass nature of the remaining component it was likely that the PET scan would be of limited utility.  At the most aggressive option I discussed referral for CT-guided needle biopsy.  I do not believe that this lesion would be easily amenable by robotic bronchoscopy because of it is very peripheral location and ground-glass nature.  Following discussion and shared decision-making between the patient and his wife, the patient decided to proceed with unenhanced CT chest in 3 months for follow-up.  I will place an order for the study and I will see him in the office in 3 months.  If he has any questions or concerns or other items in the meanwhile I have asked him to please call me.    Return to clinic in 3 months for follow-up or sooner if any issues arise.    30 minutes spent on the day of evaluation including pre visit preparation, face-to-face time and EMR documentation.    Dorene Ar, MD, FCCP  PULMONARY / CCM / SLEEP MEDICINE  [facility]

## 2023-05-18 NOTE — Telephone Encounter (Signed)
Patient was called and message was left , as well as MyChart message was sent to contact the Neuropsychology Clinic to schedule their referred evaluation.     [facility]  [facility]   [address]  , [place]  [address]  Tel: [phone]

## 2023-05-18 NOTE — Telephone Encounter (Signed)
I called to give patient his recent CT results per Dr. Lind Covert, no answer, message left for him to call clinic when he gets the message  Oletha Cruel, LPN    the recent CT which showed:     Moderately suspicious left upper lobe nodule   Will give referral to Pulmonary for further evaluation  (order noted to be in chart)

## 2023-05-19 ENCOUNTER — Encounter (HOSPITAL_BASED_OUTPATIENT_CLINIC_OR_DEPARTMENT_OTHER): Payer: Self-pay

## 2023-05-19 ENCOUNTER — Telehealth (HOSPITAL_BASED_OUTPATIENT_CLINIC_OR_DEPARTMENT_OTHER): Payer: Self-pay

## 2023-05-19 NOTE — Telephone Encounter (Signed)
I attempted to call patient again with the CT results, ML on his google voicemail to call clinic, I then attempted to call 2nd # on his profile, no answer, noted last login on MyChart was 05-18-23  Message sent via MyChart of CT results and recommendations  Joelle Roswell, LPN    the recent CT which showed:     Moderately suspicious left upper lobe nodule   Will give referral to Pulmonary for further evaluation

## 2023-05-19 NOTE — Telephone Encounter (Signed)
I received a phone call from patient and his spouse, I made them aware of the CT results and pulmonary referral  They voiced understanding and told me that they had an appt. With Dr. Raynelle Fanning yesterday.  Oletha Cruel, LPN

## 2023-05-25 ENCOUNTER — Encounter (INDEPENDENT_AMBULATORY_CARE_PROVIDER_SITE_OTHER): Payer: Self-pay

## 2023-05-26 ENCOUNTER — Ambulatory Visit (INDEPENDENT_AMBULATORY_CARE_PROVIDER_SITE_OTHER): Payer: Self-pay | Admitting: NURSE PRACTITIONER

## 2023-05-26 MED ORDER — CARBIDOPA 25 MG-LEVODOPA 100 MG TABLET
2.0000 | ORAL_TABLET | Freq: Four times a day (QID) | ORAL | 3 refills | Status: DC
Start: 2023-05-26 — End: 2023-06-29

## 2023-05-27 ENCOUNTER — Encounter (HOSPITAL_BASED_OUTPATIENT_CLINIC_OR_DEPARTMENT_OTHER): Payer: Self-pay | Admitting: Student in an Organized Health Care Education/Training Program

## 2023-05-27 ENCOUNTER — Other Ambulatory Visit: Payer: Self-pay

## 2023-05-27 ENCOUNTER — Ambulatory Visit
Payer: Medicare Other | Attending: Student in an Organized Health Care Education/Training Program | Admitting: Student in an Organized Health Care Education/Training Program

## 2023-05-27 VITALS — BP 119/65 | HR 76 | Temp 98.1°F | Resp 16 | Ht 68.0 in | Wt 220.0 lb

## 2023-05-27 DIAGNOSIS — K7469 Other cirrhosis of liver: Secondary | ICD-10-CM

## 2023-05-27 DIAGNOSIS — E559 Vitamin D deficiency, unspecified: Secondary | ICD-10-CM | POA: Insufficient documentation

## 2023-05-27 DIAGNOSIS — K7682 Hepatic encephalopathy (CMS HCC): Secondary | ICD-10-CM

## 2023-05-27 DIAGNOSIS — D696 Thrombocytopenia, unspecified: Secondary | ICD-10-CM

## 2023-05-27 DIAGNOSIS — K7460 Unspecified cirrhosis of liver: Secondary | ICD-10-CM | POA: Insufficient documentation

## 2023-05-27 DIAGNOSIS — K760 Fatty (change of) liver, not elsewhere classified: Secondary | ICD-10-CM | POA: Insufficient documentation

## 2023-05-27 DIAGNOSIS — K219 Gastro-esophageal reflux disease without esophagitis: Secondary | ICD-10-CM

## 2023-05-27 DIAGNOSIS — I8500 Esophageal varices without bleeding: Secondary | ICD-10-CM | POA: Insufficient documentation

## 2023-05-27 MED ORDER — ERGOCALCIFEROL (VITAMIN D2) 1,250 MCG (50,000 UNIT) CAPSULE
50000.0000 [IU] | ORAL_CAPSULE | ORAL | 0 refills | Status: DC
Start: 2023-05-27 — End: 2024-01-05

## 2023-05-27 NOTE — Progress Notes (Signed)
[facility]                                                      Department of Gastroenterology  [address]  [place], [place] [address]    Date:   05/27/2023  Name: Jacob Santiago  Age: 67 y.o.    Referring Provider:    No referring provider defined for this encounter.    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, thrombocytopenia      Clinic visit   Feb-12-24   =================  Patient is here for new visit   Patient has history of fatty liver disease, nonalcoholic  Denies any current or previous alcohol drinking  No family history of liver disease or GI cancer  Was diagnosed with liver cirrhosis around 10 years ago, he was following with previous GI doctor who is retired now  Patient lives 3 hours from here  Last EGD long time ago showed varices, no banding, no report available  Colonoscopy in 2020, denies any previous polyps, no family history of colon cancer, no report available, patient due for surveillance    Has thrombocytopenia thrombocytopenia,   Heartburn controlled with Protonix  Patient taking meloxicam as needed that was stopped recently  Patient on losartan, instructed to stop  Patient taking carvedilol 6.125 mg b.i.d.    No previous ascites or paracentesis, no previous variceal bleeding  Has history of elevated ammonia and hepatic encephalopathy, he ran out of lactulose    The patient denies any  difficulty with swallowing, abdominal pain, nausea, vomiting, abdominal bloating,  constipation, diarrhea, rectal bleeding, melena, hematemesis, unintentional weight loss or other complaints.        Clinic visit  August-1-24    =================  Patient is here for follow up   Needs refill for Carafate alone  Heartburn controlled with Protonix  No signs of bleeding, no recurrence of encephalopathy, no signs of ascites  Underwent EGD colonoscopy June 2024 showed grade 1 esophageal varices not amenable for banding, no esophagitis, moderate portal hypertensive gastropathy, no gastric varices, unremarkable stomach biopsy negative for H pylori    Colonoscopy with good prep showed hyperplastic polyps, nonbleeding internal and external hemorrhoids, recommended to repeat after 7 years    Chronic liver disease workup February 2024 showed unremarkable alpha-1 antitrypsin, ceruloplasmin, mitochondrial antibody, smooth muscle antibody, iron studies, acute hepatitis panel, celiac disease screening.  Patient immune to hepatitis-A and B    Ultrasound March 2024 showed no liver lesion, evidence of splenomegaly, cirrhosis AFP February 2024 unremarkable    The patient denies any  difficulty with swallowing, abdominal pain, nausea, vomiting, abdominal bloating, constipation, diarrhea, rectal bleeding, melena, hematemesis, unintentional weight loss or other complaints.        Review of Systems  See HPI.  No chest pain or sob  No fever or chills     Past Medical History:  He  has a past medical history of BPH (benign prostatic hyperplasia), CPAP (continuous positive airway pressure) dependence, Esophageal reflux, Essential hypertension, Hepatic cirrhosis (CMS HCC), Nonalcoholic fatty liver disease, Parkinson's disease (CMS HCC), Sleep apnea, and Thrombocytopenia, unspecified (CMS HCC).      Past Surgical History: He  has a past surgical history that includes hx hernia repair (Right); hx hand surgery; hx colonoscopy; and colonoscopy (04/06/2023).    Social History: His  reports that he has never smoked. He has been  exposed to tobacco smoke. He has never used smokeless tobacco. He reports that he does not drink alcohol and does not use drugs., ,     Family History: He family history is not on file.    Allergies: He is allergic to nirmatrelvir-ritonavir and simvastatin.    Home Medication:   Current Outpatient Medications   Medication Sig    bisacodyL (DULCOLAX) 5 mg Oral Tablet, Delayed Release (E.C.) TAKE ALL FOUR TABLETS AT 5 PM EVENING PRIOR TO PROCEDURE    carbidopa-levodopa (SINEMET CR) 50-200 mg Oral Tablet Sustained Release 1 tablet qAM and 2 tablets qPM    carbidopa-Levodopa (SINEMET) 25-100 mg Oral Tablet Take 2 Tablets by mouth Four times a day    carvediloL (COREG) 6.25 mg Oral Tablet Take 1 Tablet (6.25 mg total) by mouth Twice daily for 90 days    lactulose (ENULOSE) 10 gram/15 mL Oral Solution Take 30 mL by mouth Three times a day Indications: impaired brain function due to liver disease    pantoprazole (PROTONIX) 40 mg Oral Tablet, Delayed Release (E.C.) Take 1 tablet every day by oral route as needed.    tamsulosin (FLOMAX) 0.4 mg Oral Capsule Take 1 capsule every day by oral route.       Examination:  BP 119/65   Pulse 76   Temp 36.7 C (98.1 F) (Temporal)   Resp 16   Ht 1.727 m (5\' 8" )   Wt 99.8 kg (220 lb 0.3 oz)   SpO2 95%   BMI 33.45 kg/m        Wt Readings from Last 3 Encounters:   05/27/23 99.8 kg (220 lb 0.3 oz)   05/18/23 102 kg (223 lb 15.8 oz)   04/06/23 97.1 kg (214 lb)       General: Resting comfortably, no acute distress.  Eyes: Conjunctiva normal, sclera non-icteric.  HENT: Atraumatic and normocephalic.   Neck:  Supple.   Lungs: Breathing comfortably.  No respiratory distress.  Abdomen: soft lax, not tender   Extremities: No cyanosis or edema  Neurologic: Alert, Oriented, Grossly normal  Psychiatric: Appears normal    Data/Chart/Labs/Imaging reviewed:  -CBC January 2024 WBC 3.4, hemoglobin 13.8, platelets 80  -INR January 1024 was 1.34  -BMP January 2024 unremarkable  -liver panel January  2024 total bilirubin 1.9, AST 49, otherwise unremarkable  -ammonia level January 2024 140  -A1c January 2024 5.9  -hepatitis panel in January 2024 showed positive hepatitis-A IgG, negative hepatitis-B surface antigen, negative hepatitis-c antibody, positive hepatitis-B surface antibody  -modified barium swallow in May 2022 showed no hiatal hernia, no evidence of gastroesophageal reflux disease, few tubular filling defect in the lower 3rd of the esophagus compatible with esophageal varices, mild esophageal dysmotility    -EGD April 06, 2023 showed grade 1 esophageal varices in the distal esophagus without stigmata, not amenable for banding.  Regular Z-line at 40 cm, no esophagitis.  Moderate portal hypertensive gastropathy, no gastric varices, normal retroflexion, normal bulb and 2nd portion of duodenum, unremarkable  stomach biopsy negative for H pylori    -colonoscopy June 11th 2024 with adequate prep showed nonthrombosed external hemorrhoids on perianal exam, 5 mm polyp was removed, nonbleeding internal and external hemorrhoids, pathology showed hyperplastic polyp, recommended to repeat colonoscopy after 7 years      -Chronic liver disease workup February 2024 showed unremarkable alpha-1 antitrypsin, ceruloplasmin, mitochondrial antibody, smooth muscle antibody, iron studies, acute hepatitis panel, celiac disease screening.  Patient immune to hepatitis-A and B  _________________________________________________________________________  Assessment and Plan  Jacob Santiago is a 67 y.o. 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  Colon cancer screening due, last colonoscopy in 2020 was without polyps, no report available, no family history of colon cancer repeated colonoscopy with good prep June 2024 with small hyperplastic polyp removed, next colonoscopy due June 2031  Vitamin-D deficiency        My recommendations are as follows:    -I personally reviewed previous GI workup/procedures/labs and relevant imaging as stated above  Repeat EGD June 2025 for surveillance  Next colonoscopy due June 2031  Immune to hepatitis-A and B  Continue HCC screening with ultrasound and AFP every 6 months, ordered  Unremarkable screening tests for alpha-1 antitrypsin, ceruloplasmin, mitochondrial antibody, smooth muscle antibody, tTG IgA, total IgA level, iron studies, viral hepatitis panel.  Will start vitamin-D 31540 units weekly for 12 weeks, repeat level next visit  Check CBC, CMP, INR  Will continue lactulose t.i.d., titrate to 3 bowel movements per day  Recommended to stop losartan in the setting of cirrhosis  Continue carvedilol 6.25 mg p.o. b.i.d. for primary prophylaxis of variceal bleeding          Education Regarding Cirrhosis  - The etiology and natural history of cirrhosis was discussed with the patient.  This included the risk of progression to complications of end stage liver disease.  - Good liver care was discussed with the patient.  This includes avoiding hepatotoxic substances.  Specifically, no more than 2g of tylenol should be consumed a day.        Cirrhosis Guidelines:     Tylenol (acetaminophen) not to exceed 2 gms or 4 extra strength tablets in a 24 hour period.  Check all medications - many combination products contain acetaminophen.  If possible, avoid all aspirin and nonsteroidal products (Aleve, Motrin, Celebrex, etc.)  No herbal products.  No raw seafood or hamburger.  Avoid skin contact with sea water or beach  sand in warm months.  Complete abstinence from all alcohol and illegal drugs.  No smoking.  Multivitamin and calcium (1000-1500 mg) with vitamin D daily.  2 gram sodium diet.  This means avoid very salty foods such as canned soup and pickled goods.  Do not use salt substitute, but Mrs. Dash and lemon juice are safe.  Cirrhosis complications include liver failure and liver cancer.  You should have regular medical check ups and screening for liver cancer (blood test and scan) every three to six months.  Notify us immediately if you have any of the following  Eyes turn yellow  You become confused  Your belly starts to swell  You vomit blood (you should go to the nearest emergency room)          The patient was given the opportunity to ask questions and those questions were appropriately answered. The patient agreed with the treatment plan and is encouraged to call with any additional questions or concerns.     Return to the clinic in 6 months    Joslyn Devon, MD   [facility] - Gastroenterology  [place], [place]  [address]    Note: A portion of this documentation was generated using MMODAL (voice recognition software) and may contain syntax/voice recognition errors.

## 2023-05-27 NOTE — Nursing Note (Signed)
Return patient s/p coloscopy from 04/06/2023 with no complaints for this visit.

## 2023-05-29 MED ORDER — CARVEDILOL 6.25 MG TABLET
6.2500 mg | ORAL_TABLET | Freq: Two times a day (BID) | ORAL | 4 refills | Status: DC
Start: 2023-05-29 — End: 2024-07-24

## 2023-06-15 ENCOUNTER — Ambulatory Visit: Payer: Medicare Other | Attending: Clinical Neuropsychologist | Admitting: Clinical Neuropsychologist

## 2023-06-15 ENCOUNTER — Other Ambulatory Visit: Payer: Self-pay

## 2023-06-15 DIAGNOSIS — F32A Depression, unspecified: Secondary | ICD-10-CM

## 2023-06-15 DIAGNOSIS — G4700 Insomnia, unspecified: Secondary | ICD-10-CM

## 2023-06-15 DIAGNOSIS — G20A1 Parkinson's disease without dyskinesia, without mention of fluctuations (CMS HCC): Secondary | ICD-10-CM

## 2023-06-15 DIAGNOSIS — F0280 Dementia in other diseases classified elsewhere without behavioral disturbance: Secondary | ICD-10-CM

## 2023-06-15 NOTE — Neuropsychology Assessment (Signed)
NEUROPSYCHOLOGICAL EVALUATION    PATIENT NAME: [facility] NUMBER: [id number]  DATE OF SERVICE: 06/15/2023  DATE OF BIRTH: [birth year]  Time in:  0800                  Time out:   1205    CPT/CODE:  28413 = 1 unit - Clinical Interview (Interview 0800/0915)  [phone] = 1 unit - Test Administration/Data Gathering (30-minute code - ARB - 0927/1205 + 40 minutes scoring)  96139 = 6 units- Add on units of Test Administration/Data Gathering (30-minute code - Technician)  [phone] = 1 unit - Neuropsychology Testing Evaluation Physicist, medical) Services (Medical Record Review 35 minutes, Report writing 120 minutes)  96133 = 2 units - Add on Neuropsychology Testing Evaluation Physicist, medical) Services (60-minute increments)    REFERRAL INFORMATION:  Mr. Mendy Klapper is a 67 y.o. right-handed, white, cisgender, man, with 16 years of education (B.A. 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 Of note, medical record review revealed a " PMH significant for esophageal varices, non-alcoholic fatty liver cirrhosis, BPH." Medical records did not include a previous neuropsychological evaluation.     Collateral Sources of Information:   Raiyan Dorry - wife    PRESENTING CONCERNS:  PHYSICAL HISTORY  PD Hx:   On Wednesday (06/09/2023), he lost the sense of where he was in the house. Every hour, he felt lost. He did not have a clue what was going on. He then fell on the floor and his wife could not get him up. They ended up calling the EMTs. ED did a workup and found that his CO2 levels were elevated. They believe this is because he had not worn his CPAP for 5 days before this episode. Of note, he also forgot to take his extended release sinemet that evening.   Neurology (03/11/2023):   "His first symptom was difficulty walking. This first symptom started three years  ago [2021]. At his work, he was noticed to be walking slowly. Has been using the walking stick for 1.5 years. Retired from his job in October 2020. 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 No other symptoms while at work other than the walking difficulties."   "Has not had any actual falls but has had some near-falls, typically multiple times a day. He does say his back hurts a lot. Denies vertigo/lightheadedness. He does endorse freezing, this has been more recent in the last 3-4 weeks, and this contributes to the near falls. Seems to be worse in the afternoons. Also endorses festination in the last couple of weeks but has had stutter steps for a while. PT was recommended and this seemed to help. He does use a recumbent bike - he uses this every day- rides for 1 mile (10 minutes). His last PT session was the end of Feb. He then went on a trip to [place] (they had a new grandbaby in March) and he never returned back to his baseline prior to the [place] trip."  "He has a rare tremor in his bilateral hands, it is at rest. He has a lot of trouble with buttoning buttons, he is starting to need more help with getting dressed and chooses clothing that is easier for him to wear (like t-shirts and sweatshirts). No toe curling or cramping. He does not have a bed partner (wife chose not  to sleep in same bed because She kicks a lot). He does endorse vivid dreams but not clear RBD. He does have trouble turning over in bed at night. No change in sense of smell. No obvious constipation but is on lactulose for the liver disease."   Sinemet responsive. Wearing off 30 minutes before next dose.   They are reportedly not interested in DBS at this time.   Incontinence:   No depends but urge incontinence. They need to get him a urinal. They have to make frequent stops while driving.   Constipation.   Vision/Hearing:   Blended bifocals.   Bilateral hearing.   Taste/Smell:   Denied.     COGNITIVE HISTORY:    Onset:   Wife:   7 - 8 years started an insidious decline.   She began to notice that he was completing less projects in the home and they could get into arguments over incomplete projects.   More rapid decline around the time of his retirement (08/2019) per his wife.   PT:  Onset around retirement (08/2019). He is still involved with employee training.   He endorses some cognitive decline. 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   Memory:   PT:   Endorses more trouble with his memory in the last year or two, which his wife confirms.    Episode this week in which he was totally out of it. On Wednesday (06/09/2023), fall + ED did a workup and found that his CO2 levels were elevated. They believe this is because he had not worn his CPAP for 5 days before this episode. Of note, they felt he had returned to baseline or they were going to cancel his neuropsychological evaluation.   Wednesday evening he was acting a little atypical (2030) going up the stairs but having trouble (i.e., leaning heavily on the railing). Wife stayed downstairs to help care for the her mother. At 1130, he needed help getting to the bathroom. [phone] called for her twice. Significant movement difficulties. 0200 could barely get him to move or follow logical commands. He ended on the floor.   No UTI. C02 was all they found. He had also forgotten to taken his extended release sinemet.   They did not keep him overnight.   His wife notes that he has improved significantly since that time.   Attention:   Trouble with his attention and concentration for the past few years.   Feels less sharp.   Language:   WF - longer term difficulty. Last 2 - 3 years. His daughter has encouraged him to write a journal due to his language difficulties.   Loses his train of thought more easily while speaking.   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   Slowed thinking does not help.    Visuospatial:  7 - 8 years ago; started having problems completing projects. He also endorses difficulty with visuospatial projects.   Processing Speed:   Slowed thinking x 2- 3 years.   Behaviors:   Denied.     EMOTIONAL HISTORY:  Depression:   He notes that his depression is up. His local doctor offered to prescribe him medications. He does not like the idea of depression medication.   Some loss of identity after retirement.   Watches TV a lot.   Anxiety:   Worries about normal family stuff. Does not interfere with life.  Irritability/agitation:   Denied, but maybe some marital stress due to his changes.    Impulse Control: Denied  Psychosis/mania: Denied  Trauma/Abuse:   Denied.   Suicidal/Homicidal ideation: When questioned directly, the patient denied current or historical suicidal or homicidal ideation, plan, or intent.   Psychiatric Hospitalizations: Denied  Psychiatrist:   Not very interested in psychotropic medications.   Talk Therapy:   Marital therapy. In the past.   Family Therapy. In the past.   He does not have a therapist but he is interested.   Sleep:   2130 - 0100/0200. Waking up frequently for bathroom. He is up for the day at 0200.   Napping during the day 60 - 120 minutes x1/day..   Back to wearing his CPAP again. He did a 5 day period 1 week ago in which he did not wear it and build up CO2 that lead to an ED workup. They did not keep him over night at that time.    Appetite: denied.   Pain: The patient denied debilitating levels of pain.     ADLS/IADLS:  Neurology (02/2023): "Still does the taxes, pays the bills (but does a lot on automatic pay). He is no longer driving, stopped about 2 years ago. Wife did not want him to drive anymore after he went around a curb too quickly. His son also does not want him to be driving."    ADLS: Functional Description: IADLS: Functional Description:   Bathing Independent; has grips.  Shopping Independent; he helps with grocery shopping.    Selecting  Appropriate Attire  Independent Basic Housework/Maintenance  Independent   Putting On Clothes  Independent; wife helps with his socks and buttons on shirts.  IT consultant,  Independent Managing Transportation/Driving Independent; family prefers he not drive.    Oral Hygiene Independent Cooking/Preparing Meals  Independent   Toileting/Maintaining Continence  #1: Independent  #2: Independent Managing Medications Independent; but he is missing some doses. He has a phone program to remind him to take his meds. 4x/week misses a dose. His wife is about to put his schedule on her phone.    Walking/Transferring  Walks with a right handed cane and rarely uses the wheel chair.  Managing Finances Independent   Climbing Stairs Independent, but with great effort. He would have to stop a lot and needs a hand rain.        MEDICAL HISTORY:  The patient denied a history of birth complications (Forceps used on his head during delivery), difficulty achieving their developmental milestones or childhood illnesses/surgeries.   The patient denied a history of ischemic/hemorrhagic stroke, traumatic brain injury, or seizures.  Fainted once when he was laughing too hard.     Past Medical History:   Diagnosis Date    BPH (benign prostatic hyperplasia)     CPAP (continuous positive airway pressure) dependence     Esophageal reflux     Essential hypertension     Hepatic cirrhosis (CMS HCC)     Nonalcoholic fatty liver disease     Parkinson's disease (CMS HCC)     Sleep apnea     Thrombocytopenia, unspecified (CMS HCC)      Past Surgical History:   Procedure Laterality Date    COLONOSCOPY  04/06/2023    HX COLONOSCOPY      HX HAND SURGERY      HX HERNIA REPAIR Right     mesh  Family medical history included:  Parkinson's disease - paternal grandfather (tremor); paternal uncle   The patient denied a family history of neurocognitive disorder or  dementia.    NEUROIMAGING:  Brain MRI w&w/o Contrast (02/15/2020): "IMPRESSION: 1. No acute intracranial abnormality. 2. Mild chronic small vessel ischemic disease and cerebral atrophy "    SUBSTANCE USE HISTORY:  Per patient:  Alcohol: Denied  Cannabis:  Denied  Cocaine/Crack:  Denied  Opiates/Opioids:  Denied  Amphetamines:  Denied  Barbiturates:  Denied  Hallucinogens:  Denied   IV Drug Use:  Denied  Prescription Medication Overuse: Denied  Tobacco Products: Denied  Caffeine:   Used to drink a lot of coffee. 4 - 5 cups/day.   Current: 1 cup of coffee.     MEDICATIONS:  Current Outpatient Medications   Medication Sig    bisacodyL (DULCOLAX) 5 mg Oral Tablet, Delayed Release (E.C.) TAKE ALL FOUR TABLETS AT 5 PM EVENING PRIOR TO PROCEDURE    carbidopa-levodopa (SINEMET CR) 50-200 mg Oral Tablet Sustained Release 1 tablet qAM and 2 tablets qPM    carbidopa-Levodopa (SINEMET) 25-100 mg Oral Tablet Take 2 Tablets by mouth Four times a day    carvediloL (COREG) 6.25 mg Oral Tablet Take 1 Tablet (6.25 mg total) by mouth Twice daily for 90 days    ergocalciferol, vitamin D2, (VITAMIN D) 1,250 mcg (50,000 unit) Oral Capsule Take 1 Capsule (50,000 Units total) by mouth Every 7 days for 123 doses Indications: low vitamin D levels    lactulose (ENULOSE) 10 gram/15 mL Oral Solution Take 30 mL by mouth Three times a day Indications: impaired brain function due to liver disease    pantoprazole (PROTONIX) 40 mg Oral Tablet, Delayed Release (E.C.) Take 1 tablet every day by oral route as needed.    tamsulosin (FLOMAX) 0.4 mg Oral Capsule Take 1 capsule every day by oral route.     SOCIAL HISTORY:  Per patient:  Education:   16 years of formal education (standard high school diploma + B.A. in [employer]).  Denied a history of learning difficulties, attention problems, or behavioral problems.   Military service: denied.   Occupation:   32 years at company. 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   Took extra education in addition to B.A.   Relationships:   Married (1982) 42 years  2 children (92M and 31) - family farmer and M.S. Nurse, mental health and pharm company  "In his free time he enjoys fishing but hasn't gotten to do that recently. They are now living in [place] with the patient's mother-in-law. Patient lives with wife and mother-in-law. He mostly stays in the house and does not leave very often. He has been told he shouldn't be out and about by himself. He is concerned about his memory because of his liver issues causing high ammonia levels."    BEHAVIORAL OBSERVATIONS:  Orientation: Alert and grossly oriented to person, place, time, and situation   Grooming & hygiene: Intact  Gait:   Wheelchair for speed purposed. He moves around cautiously with a cane. Unsteady narrow base. Short stride length. Minimal arm swing.   Motor functioning:   Slow  Mild dyskinesia   Speech:   Slow  Voice is soft and raspy.   At times, loses his train of thought.   Receptive language: No difficulties understanding conversation  Affect: Stable  Mood: Calm, relaxed  Social comportment: Normal eye  contact and range of facial expression  Thought process:  Logical and goal-directed, but prone to tangents.   Thought content: Appropriate to topic of discussion  Vision: Corrected  Hearing:   Bilateral hearing aids.     NEUROBEHAVIORAL STATUS EXAM FINDINGS:  Mild action/intention tremor during finger-to-nose.  Mild dyskinesia.  Stimulus bound starting during rapid alternating hand and finger movements.  Passive movement evidenced mild paratonia, bilaterally.  Glabellar tap did not extinguish.  Drawing of ramp arts evidenced multiple perseverations.  Administrator evidenced constructional difficulties.  Rote auditory encoding was 3/3 with 2/3 at short delayed free recall that benefitted from forced choice cuing.  Long delayed free recall and extended delayed free recall were 3/3.  The  patient evidenced 1 self corrected error during complex command across midline.  The patient experienced difficulty with scissors bilaterally during praxis assessment.      2020 Neuropsychology Consensus  QUALITATIVE DESCRIPTOR GUIDELINES:  Standard Score  Percentile   Score Label  >130   >98    Exceptionally high score  120-129  91-97    Above average score  110-119  75-90   High average score  90-109   25-74   Average score  80-89   9-24   Low average score  70-79   2-8   Below average score  <70   <2   Exceptionally low score    TEST RESULTS:    EXAM A: 06/15/2023              Age: 66, Ethn: C, Ed: 16, DH: R     Raw SS %tile Interp   COGNITIVE STATUS                   Montreal Cognitive Assessment   21/30   MCI            PREMORBID FUNCTIONING                   BARONA ESTIMATED FSIQ    113 81 High Avg   Word Reading (WRAT-4) BLUE 64 107 68 Avg            ATTENTION & WORKING MEMORY                  Digit Span (WAIS-IV)   14 4 2  Below Avg   Forward   8 8 25  Avg   Backward   5 6 9  Low Avg   Sequencing   1 2 0.8 Excep Low            VISUAL-SPATIAL                  Judgment of Line Orientation* RBANS 15 92 30 WNL   Matrix Reasoning (WAIS-IV)   9 7 16  Low Avg   Rey-O (Copy Only)*   25.5 8 <=1 Excep Low            LANGUAGE                  [place] Naming Test*   59 120 91 WNL   FAS   7 48 <0.1 Excep Low   Animal Naming   11 68 2 Excep Low   Fruit/ Vegetable   9               MEMORY & LEARNING                  [place] Verbal Learning Task-II SHORT  Trial 1   3 Z -3.0 0.3 Excep Low   Trial 2   5 Z -1.5 6 Below Avg   Trial 3   6 Z -1.0 16 Low Avg   Trial 4   6 Z -1.0 16 Low Avg   Total Recall   20 T 38 12 Low Avg   Short Delay Free Recall   5 Z -1.0 16 Low Avg   Long Delay Free Recall   4 Z -1.0 16 Low Avg   Long Delay Cued Recall   5 Z -0.5 30 Avg   Recognition Discriminability   2.8 Z 0.5 70 Avg   Forced Choice Recognition   100%                    Brief Visuospatial Memory Test-R  FM-1       Total Recall   11 72 3  Below Avg   Delayed Recall   3 67 1 Excep Low   Discrimination Index*   5  [>16] Avg   Copy   11               EXECUTIVE FUNCTIONING                   Trail Making Test: M         TMT-M 1 (Part-A)   80'' 60 0.8 Excep Low   TMT-M 2   63''      TMT-M 3   75''      TMT-M 4   66''      TMT-M 5   63''      TMT-M Delay   82''      Trail Making Test: Part-B   244'' 64 1 Excep Low                 [place] Card Sorting Test STD       Categories Completed*   0  [<1] Sev Imp   Failures to Maintain Set*   0  [>16] Avg   Total Errors   92 64 1 Excep Low   Perseverative Responses   112 55 0.3 Excep Low                 Stroop Color-Word Test         Word Score   48 T<20 0.1 Excep Low   Color Score   35 T 17 <0.1 Excep Low   Color-Word Score   19 T 32 4 Below Avg   Interference Score    T 50 50 Avg            MOOD & PERSONALITY                  GAI   7   Min   Geriatric Depression Scale   7   Mild           PDQ-39         Mobility   60 48  -   ADL   42 45  -   Emotional   29 44  -   Stigma   38 51  -   Social   33 54  -   Cognition   44 49  -   Communication   33 48  -   Body Discomfort   67 56  -  Functional Activities Questionnaire   12   Imp            Notes:         *Tests with non-normally distributed normative data             IMPRESSIONS:  Mr. Ayhan Hemp is a 67 y.o. right-handed, white, cisgender, man, with 16 years of education (B.A. 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 Of note, medical record review revealed a " PMH significant for esophageal varices, non-alcoholic fatty liver cirrhosis, BPH." Medical records did not include a previous neuropsychological evaluation.     Mr. Stalvey' estimated level of premorbid function was in the average to high average range, his profile revealed problems with working memory, complex  visual-spatial planning, higher level language abilities, auditory learning, visual learning and delayed recall learning with benefit from recognition, processing speed, and executive function.  The patient's processing speed, working memory, and executive function difficulties were the most prominent characteristic of his profile and align with the subcortical changes associated with Parkinson's disease. Of note, these problem areas align with the patient and his family' s reported areas of decline (i.e., incomplete projects, losing his train of thought mid-sentence, WF, trouble with multi-tasking, STM problems, and slowed thinking). 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    The discrepancy between onset periods described by the patient's wife (2017-2018) and the patient (2020) makes speculation about liver involvement possible. While he has a classic pattern of decline for PD, his wife felt she started noticing cognitive decline years before his PD diagnosis, and the current decline is relatively quick for PD alone, so it could be that his liver problems are contributing to worsening cognitive dysfunction. In general, the cognitive profiles are similar. With that said, modifiable factors included a recent episode of increased CO2 due to lack of compliance with his CPAP one week before his assessment, which could also contribute to subcortical/cortical cognitive problems. Of additoinal note, he is experiencing mild symptoms of major depressive disorder.     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      Follow-up with their neurologist was recommended.      2.      Mr. Rohal is encouraged to seek individual therapy services. While availability may be limited, contacting the main scheduling line, [phone], is the best option for receiving services with [facility]. The patient indicated he is not interested in psychiatric services at this time.      3.      To compensate for general cognitive difficulties, the following recommendations may help maximize the patient's functioning:  a. Build in extra time to complete tasks.   b. Increased structure in their environment.  c. Breaking lengthier and more time-consuming tasks into smaller segments, completing each one before moving onto the next.  d. Minimizing competing stimuli/distractions in order to maximize their attentional capacity.   e. Write down  and organize information to-be-remembered.  f.  Utilize a daily planner/calendar. Create daily, medium range (weeks - months), and long range (yearly or more) goals with behaviorally observable steps that will display their successful completion.    g. Ask for repetition of important information and repeat it back to ensure completeness and accuracy. Have the patient repeat back important information to determine their level of understanding and comprehension.      4.      While the patient's current difficulties in everyday life were relatively minimal, the progressive nature of their difficulties was concerning for a progressive neurocognitive disorder.   It was recommended that the patient and their family put their care wishes into writing in the anticipation of possible decline and the need for increasing supervision at all levels was necessary.  [email or web address]  Someday this could include monitoring for medication compliance,  as well as assistance with general health and nutrition.  Supervision regarding their daily routine could become necessary.  Monitoring for changes in ADLS/IADLS was counseled.  We recommend supervision with regard to financial, medical, and legal decisions.  We recommend DPOA and Guardianship issues be resolved.     5.      Healthy diet, physical activity, and stress management strategies are recommended, as healthy lifestyle factors are important for physical, emotional, and cognitive wellbeing. Healthy diet with many vegetables, lean proteins, and healthy fats is recommended. Approximately 30 minutes of low-impact physical exercise five days per week is recommended (if approved by medical providers), as physical activity is protective against cognitive changes over time and can help with stress management, improving mood, and improving sleep. Finally, mediation and/or yoga can help to reduce stress. Yoga and meditation videos are available on YouTube. The patient may search the App Store on iPhone or Android devices for apps such as Headspace or Calm that provide guided meditation and progressive muscle relaxation exercises.     6.      The patient is encouraged to visit [email or web address] to find support groups and exercise classes for people with Parkinson's disease in their local area.      7.      Additionally, [place] offers boxing for those with Parkinson's Disease: [email or web address]      9.      Recommend following up with his pulmonologist re: sleep apnea. Has a lung nodule that they are keeping an eye on.     10.   The importance of CPAP compliance was stressed. This patient may benefit from individual therapy aimed at improving their sleep. Raeanne Gathers, Ph.D. is one option for a referral for Cognitive Behavioral Therapy for Insomnia (CBT-I) and her office can be reached at  [phone]. Additionally, the patient reported sleep difficulties and was encouraged to follow up with their medical providers. The patient may also benefit from utilizing the following sleep hygiene techniques on a regular basis:  Developing a routine (i.e. only get into bed when it was time to sleep)  Avoid stimulants such as caffeine, nicotine, and alcohol too close to bedtime  Food can be disruptive right before sleep; stay away from large meals close to bedtime.  Do not watch T.V.; read, eat, or listen to music while in bed.     11.   Comprehensive feedback regarding the results of this neuropsychological evaluation will be provided for the patient as scheduled. A re-evaluation in 13 to 18 months was recommended.      The Neuropsychology Consult Team would like to thank you for allowing  [place] to participate in this patient's care. If you have questions after reading the results and impressions of the neuropsychological evaluation, please do not hesitate to contact this provider, Teena Irani. Assad Harbeson, Ph.D. (EXT. [phone]).      DICTATED REPORT:  The patient was informed of the purpose of the visit and the limits of confidentiality. Oral informed consent for assessment procedures was provided and agreed upon by the patient. Medical record review, clinical interview, and assessment were completed.     Teena Irani. Hilario Quarry, Ph.D., ABPP-CN  Board Certified Clinical Neuropsychologist  Associate Professor    [facility] Psychiatry

## 2023-06-29 ENCOUNTER — Other Ambulatory Visit (INDEPENDENT_AMBULATORY_CARE_PROVIDER_SITE_OTHER): Payer: Self-pay | Admitting: NURSE PRACTITIONER

## 2023-06-29 MED ORDER — CARBIDOPA 25 MG-LEVODOPA 100 MG TABLET
2.0000 | ORAL_TABLET | Freq: Four times a day (QID) | ORAL | 3 refills | Status: DC
Start: 2023-06-29 — End: 2024-02-21

## 2023-07-12 ENCOUNTER — Telehealth: Payer: Self-pay | Admitting: Adult Health

## 2023-07-12 NOTE — Telephone Encounter (Signed)
Pt's wife called for date pt got CPAP machine due to have moved and information was needed to obtain a new CPAP machine.

## 2023-07-15 ENCOUNTER — Other Ambulatory Visit (HOSPITAL_BASED_OUTPATIENT_CLINIC_OR_DEPARTMENT_OTHER): Payer: Self-pay | Admitting: Student in an Organized Health Care Education/Training Program

## 2023-07-15 ENCOUNTER — Other Ambulatory Visit: Payer: Self-pay

## 2023-07-15 ENCOUNTER — Ambulatory Visit
Admission: RE | Admit: 2023-07-15 | Discharge: 2023-07-15 | Disposition: A | Discharge status: Home / Self Care | Payer: Medicare Other | Source: Ambulatory Visit | Attending: Student in an Organized Health Care Education/Training Program | Admitting: Student in an Organized Health Care Education/Training Program

## 2023-07-15 DIAGNOSIS — K760 Fatty (change of) liver, not elsewhere classified: Secondary | ICD-10-CM

## 2023-07-15 DIAGNOSIS — K7460 Unspecified cirrhosis of liver: Secondary | ICD-10-CM

## 2023-07-15 DIAGNOSIS — I8500 Esophageal varices without bleeding: Secondary | ICD-10-CM

## 2023-07-15 DIAGNOSIS — E559 Vitamin D deficiency, unspecified: Secondary | ICD-10-CM

## 2023-07-22 ENCOUNTER — Ambulatory Visit: Payer: Medicare Other | Attending: Clinical Neuropsychologist | Admitting: Clinical Neuropsychologist

## 2023-07-22 DIAGNOSIS — K760 Fatty (change of) liver, not elsewhere classified: Secondary | ICD-10-CM | POA: Insufficient documentation

## 2023-07-22 DIAGNOSIS — G4700 Insomnia, unspecified: Secondary | ICD-10-CM | POA: Insufficient documentation

## 2023-07-22 DIAGNOSIS — F0280 Dementia in other diseases classified elsewhere without behavioral disturbance: Secondary | ICD-10-CM

## 2023-07-22 DIAGNOSIS — F02818 Dementia in other diseases classified elsewhere, unspecified severity, with other behavioral disturbance: Secondary | ICD-10-CM | POA: Insufficient documentation

## 2023-07-22 DIAGNOSIS — G20A1 Parkinson's disease without dyskinesia, without mention of fluctuations: Secondary | ICD-10-CM | POA: Insufficient documentation

## 2023-07-22 DIAGNOSIS — F0283 Dementia in other diseases classified elsewhere, unspecified severity, with mood disturbance: Secondary | ICD-10-CM | POA: Insufficient documentation

## 2023-07-22 DIAGNOSIS — F32A Depression, unspecified: Secondary | ICD-10-CM

## 2023-07-22 NOTE — Progress Notes (Signed)
TELEMEDICINE DOCUMENTATION:    Patient Location:  [address] [place] [address]     Patient/family aware of provider location: Yes  Patient/family consent for telemedicine:  Yes  Examination observed and performed by:  DMS, Ph.D.    Enzo Bi FEEDBACK NOTE  PATIENT NAME: Jacob Santiago  MRN: [id number]  DOB: 04/22/1956  DOS: 07/22/2023  TIME IN/OUT: 1000/1055    Service Provider:  Teena Irani. Hilario Quarry, PhD Clinical Neuropsychologist  CPT [phone] - 1 unit Interactive Feedback Session - Neuropsych    S: The patient was phoned because they were interested in gaining a better understanding of the neuropsychological evaluation and cognitive, emotional, and behavioral functioning.     OCharna Elizabeth was accompanied by his wife [name] - wife). The patient was informed of the purpose of the visit and the limits of confidentiality. Oral informed consent for feedback procedures was provided and agreed upon by the patient. The patient was alert and grossly oriented to person, place, time, and situation. The patient was casually dressed and appropriately groomed. Mood was euthymic and affect was flat. There was no evidence of hallucinations or delusions. Attention and concentration were adequate. Eye contact was appropriate. Uncorrected hearing were adequate for feedback purposes. Speech was unremarkable.  Comprehension appeared intact. Thought processes were clear and logical. Judgment and insight appeared to be intact. Lethality risk and protective factors remained unchanged. When explicitly asked, the patient denied current ideation or intent to engage in self- or other- directed violence.    A: [name] was provided telehealth feedback regarding their neuropsychological evaluation. The patient was informed about their neuropsychological profile, and asked to provide their understanding of our findings. This author discussed with [name], in detail, the specific findings in each of the cognitive  domains, their overall pattern of performance, and the recommendations that were generated based on the results.     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 The patient was provided a copy of this author's business card and instructed to call with questions, should they arise, after they have ample time to review their entire neuropsychological evaluation. They received their report digitally. A referral for individual talk therapy was placed.     Thank you for allowing [place] to participate in this patient's care. If you have questions after reading the results and impressions of the neuropsychological evaluation, please do not hesitate to contact this provider, Teena Irani. Maury Groninger, Ph.D. (EXT. [phone]). I reviewed the problem list only as it pertains to his her cognitive and psychiatric diagnosis.    Teena Irani. Hilario Quarry, Ph.D.  Assistant Professor  [employer] [facility] and Psychiatry

## 2023-07-26 ENCOUNTER — Telehealth (HOSPITAL_COMMUNITY): Payer: Self-pay

## 2023-07-26 NOTE — Telephone Encounter (Signed)
Patient has been placed on the therapy wait list. Patient was made aware of the wait list process.  Domenick Bookbinder, CASE MANAGER  07/26/2023, 13:16

## 2023-08-04 ENCOUNTER — Telehealth (HOSPITAL_BASED_OUTPATIENT_CLINIC_OR_DEPARTMENT_OTHER): Payer: Self-pay | Admitting: Pulmonary Disease

## 2023-08-04 NOTE — Telephone Encounter (Signed)
L/m for patient to make him awre of his CT chest its on 08/18/2023 @545pm 

## 2023-08-18 ENCOUNTER — Ambulatory Visit (HOSPITAL_COMMUNITY): Payer: Self-pay

## 2023-08-24 ENCOUNTER — Ambulatory Visit (HOSPITAL_BASED_OUTPATIENT_CLINIC_OR_DEPARTMENT_OTHER): Payer: Self-pay | Admitting: Pulmonary Disease

## 2023-09-02 ENCOUNTER — Encounter (INDEPENDENT_AMBULATORY_CARE_PROVIDER_SITE_OTHER): Payer: Self-pay

## 2023-09-05 ENCOUNTER — Ambulatory Visit (HOSPITAL_COMMUNITY): Payer: Self-pay

## 2023-09-06 ENCOUNTER — Ambulatory Visit
Admission: RE | Admit: 2023-09-06 | Discharge: 2023-09-06 | Disposition: A | Discharge status: Home / Self Care | Payer: Medicare Other | Source: Ambulatory Visit | Attending: Student in an Organized Health Care Education/Training Program | Admitting: Student in an Organized Health Care Education/Training Program

## 2023-09-06 ENCOUNTER — Other Ambulatory Visit: Payer: Self-pay

## 2023-09-06 ENCOUNTER — Other Ambulatory Visit (HOSPITAL_BASED_OUTPATIENT_CLINIC_OR_DEPARTMENT_OTHER): Payer: Self-pay | Admitting: Student in an Organized Health Care Education/Training Program

## 2023-09-06 DIAGNOSIS — K7460 Unspecified cirrhosis of liver: Secondary | ICD-10-CM

## 2023-09-15 ENCOUNTER — Ambulatory Visit (INDEPENDENT_AMBULATORY_CARE_PROVIDER_SITE_OTHER): Payer: Self-pay | Admitting: Neurology

## 2023-09-15 NOTE — Telephone Encounter (Signed)
Call received from patient's wife, Consuella Lose. She says that they received a notification that his 11/22 appt with Dr Daisey Must was rescheduled 12/13 and that Teri's current Sinemet regimen is not helping enough.   Told Consuella Lose can schedule sooner appt this week with Jenny Reichmann APRN to discuss medication changes if they would like. Consuella Lose said she needs to wait and see if they get a big snow storm tomorrow. Told her can ask [employer] if she would do a MyChart video message and call her back tomorrow. Consuella Lose requests call before noon as she will be out in the afternoon.

## 2023-09-15 NOTE — Telephone Encounter (Signed)
Regarding: [employer]  ----- Message from Mariann Laster sent at 09/13/2023  4:17 PM EST -----  Copied From CRM [id number].  Sonn, [place] called to request a prescription refill.       Per Consuella Lose - med is not lasting long enough, wants a call back regarding this    carbidopa-levodopa (SINEMET CR) 50-200 mg Oral Tablet Sustained Release  carbidopa-Levodopa (SINEMET) 25-100 mg Oral Table         [facility] - [place], [place] - [address]    [address] [facility] [place] [address]    Phone: [phone] Fax: [phone]    Hours: Not open 24 hours

## 2023-09-16 ENCOUNTER — Encounter (INDEPENDENT_AMBULATORY_CARE_PROVIDER_SITE_OTHER): Payer: Self-pay
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
## 2023-09-17 ENCOUNTER — Ambulatory Visit: Payer: Medicare Other | Attending: NURSE PRACTITIONER | Admitting: NURSE PRACTITIONER

## 2023-09-17 ENCOUNTER — Encounter (INDEPENDENT_AMBULATORY_CARE_PROVIDER_SITE_OTHER): Payer: Self-pay | Admitting: NURSE PRACTITIONER

## 2023-09-17 ENCOUNTER — Ambulatory Visit (INDEPENDENT_AMBULATORY_CARE_PROVIDER_SITE_OTHER): Payer: Self-pay | Admitting: Neurology

## 2023-09-17 DIAGNOSIS — G20A1 Parkinson's disease without dyskinesia, without mention of fluctuations (CMS HCC): Secondary | ICD-10-CM

## 2023-09-17 DIAGNOSIS — R498 Other voice and resonance disorders: Secondary | ICD-10-CM

## 2023-09-17 DIAGNOSIS — R1310 Dysphagia, unspecified: Secondary | ICD-10-CM

## 2023-09-29 HISTORY — PX: HX LOBECTOMY: SHX167

## 2023-10-08 ENCOUNTER — Ambulatory Visit (INDEPENDENT_AMBULATORY_CARE_PROVIDER_SITE_OTHER): Payer: Self-pay | Admitting: Neurology

## 2023-10-15 ENCOUNTER — Telehealth (HOSPITAL_BASED_OUTPATIENT_CLINIC_OR_DEPARTMENT_OTHER): Payer: Self-pay | Admitting: Pulmonary Disease

## 2023-10-15 NOTE — Telephone Encounter (Signed)
I spoke with the patient and wife via telephone.  I had placed a call because the patient failed to obtain a follow up CT chest in October as per our prior visit.  Patient wife told me that he ended up seeing Dr. 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  They were pleased with the outcome.  I am pleased that this area was treated.  He will continue to follow the lead of Dr. Dia Sitter in this regard.

## 2023-11-12 ENCOUNTER — Ambulatory Visit (HOSPITAL_COMMUNITY): Payer: Self-pay

## 2023-11-22 ENCOUNTER — Ambulatory Visit (HOSPITAL_BASED_OUTPATIENT_CLINIC_OR_DEPARTMENT_OTHER): Payer: Medicare Other | Admitting: Student in an Organized Health Care Education/Training Program

## 2023-11-24 ENCOUNTER — Encounter (INDEPENDENT_AMBULATORY_CARE_PROVIDER_SITE_OTHER): Payer: Self-pay | Admitting: Neurology

## 2023-12-25 ENCOUNTER — Other Ambulatory Visit (INDEPENDENT_AMBULATORY_CARE_PROVIDER_SITE_OTHER): Payer: Self-pay | Admitting: Neurology

## 2024-01-05 ENCOUNTER — Other Ambulatory Visit (HOSPITAL_BASED_OUTPATIENT_CLINIC_OR_DEPARTMENT_OTHER): Payer: Self-pay | Admitting: Student in an Organized Health Care Education/Training Program

## 2024-01-05 DIAGNOSIS — E559 Vitamin D deficiency, unspecified: Secondary | ICD-10-CM

## 2024-01-05 MED ORDER — ERGOCALCIFEROL (VITAMIN D2) 1,250 MCG (50,000 UNIT) CAPSULE
50000.0000 [IU] | ORAL_CAPSULE | ORAL | 0 refills | Status: DC
Start: 2024-01-05 — End: 2024-01-11

## 2024-01-11 ENCOUNTER — Other Ambulatory Visit (HOSPITAL_BASED_OUTPATIENT_CLINIC_OR_DEPARTMENT_OTHER): Payer: Self-pay | Admitting: Student in an Organized Health Care Education/Training Program

## 2024-01-11 ENCOUNTER — Telehealth (HOSPITAL_BASED_OUTPATIENT_CLINIC_OR_DEPARTMENT_OTHER): Payer: Self-pay | Admitting: Student in an Organized Health Care Education/Training Program

## 2024-01-11 DIAGNOSIS — E559 Vitamin D deficiency, unspecified: Secondary | ICD-10-CM

## 2024-01-11 MED ORDER — ERGOCALCIFEROL (VITAMIN D2) 1,250 MCG (50,000 UNIT) CAPSULE
50000.0000 [IU] | ORAL_CAPSULE | ORAL | 0 refills | Status: AC
Start: 2024-01-11 — End: 2026-05-15

## 2024-01-12 ENCOUNTER — Ambulatory Visit
Admission: RE | Admit: 2024-01-12 | Discharge: 2024-01-12 | Disposition: A | Discharge status: Home / Self Care | Payer: Self-pay | Source: Ambulatory Visit | Attending: Student in an Organized Health Care Education/Training Program | Admitting: Student in an Organized Health Care Education/Training Program

## 2024-01-12 ENCOUNTER — Other Ambulatory Visit: Payer: Self-pay

## 2024-01-12 ENCOUNTER — Ambulatory Visit (INDEPENDENT_AMBULATORY_CARE_PROVIDER_SITE_OTHER): Payer: Self-pay

## 2024-01-12 DIAGNOSIS — K7460 Unspecified cirrhosis of liver: Secondary | ICD-10-CM | POA: Insufficient documentation

## 2024-01-15 ENCOUNTER — Ambulatory Visit (HOSPITAL_BASED_OUTPATIENT_CLINIC_OR_DEPARTMENT_OTHER): Payer: Self-pay | Admitting: Student in an Organized Health Care Education/Training Program

## 2024-01-15 ENCOUNTER — Other Ambulatory Visit (HOSPITAL_BASED_OUTPATIENT_CLINIC_OR_DEPARTMENT_OTHER): Payer: Self-pay | Admitting: Student in an Organized Health Care Education/Training Program

## 2024-01-15 DIAGNOSIS — K7460 Unspecified cirrhosis of liver: Secondary | ICD-10-CM

## 2024-01-17 ENCOUNTER — Telehealth (HOSPITAL_BASED_OUTPATIENT_CLINIC_OR_DEPARTMENT_OTHER): Payer: Self-pay | Admitting: Student in an Organized Health Care Education/Training Program

## 2024-01-17 NOTE — Telephone Encounter (Signed)
 Left voicemail message for patient regarding his ultrasound results and further testing that has been scheduled. Results are as follows:      1. Cirrhosis.  No liver lesion  2.Portal vein flow detected. Can not exclude thrombosis or severely diminished flow secondary to portal hypertension.  3.Cholelithiasis and probable gallbladder sludge without evidence of acute cholecystitis.     Will order CT abdomen pelvis with venogram for evaluation to rule out portal vein thrombosis, please schedule as soon as possible:  scheduled for 01/18/2024 at 10:00 am     Will order ultrasound after 6 months for surveillance:  scheduled for 07/26/2024 at 10:45 am     I will see Jacob Santiago in the clinic again as previously scheduled to further discuss about the results in-detail. I will formulate further treatment plan at the time.    Contact information left on patients voicemail to return call if he has any questions or concerns. 01/17/2024 @ 8:12 am

## 2024-01-17 NOTE — Telephone Encounter (Signed)
 Patient and wife called back. Advised per Dr. Yvone Herd the recent ultrasound which showed:     1. Cirrhosis.  No liver lesion   2.Portal vein flow detected. Can not exclude thrombosis or severely diminished flow secondary to portal hypertension.   3.Cholelithiasis and probable gallbladder sludge without evidence of acute cholecystitis.     Will order CT abdomen pelvis with venogram for evaluation to rule out portal vein thrombosis, please schedule as soon as possible     Will order ultrasound after 6 months for surveillance     I will see Mr.Jacob Santiago in the clinic again as previously scheduled to further discuss about the results in-detail. I will formulate further treatment plan at the time.   Advised of CT Scan scheduled for 10:00 am tomorrow and importance of having it done because of possible clot. Also, advised if not done tomorrow the earliest they have will be in May.  Patient and wife voiced understanding and states they will be here for the CT Scan.  Agapito Horseman, LPN    US  ABDOMEN COMPLETE (ADULT)  Order: [id number]   Status: Final result       Dx: Hepatic cirrhosis, unspecified hepati...    Test Result Released: Yes (seen)    View Coordination for this Result       View Follow-Up Encounter  Details    Reading Physician Reading Date Result Priority   Therman Flatter, MD  [phone]     01/12/2024 Routine     Narrative & Impression  DRUE HARR  Male, 68 years old.     US  ABDOMEN COMPLETE (ADULT) performed on 01/12/2024 11:46 AM.     REASON FOR EXAM:  K74.60: Hepatic cirrhosis, unspecified hepatic cirrhosis type, unspecified whether ascites present (CMS HCC)     COMPARISON: RUQ ultrasound 07/15/2023     FINDINGS:   PANCREAS: The visualized portion of the pancreas is within normal limits.     LIVER: The liver is normal in size, with nodular contour and heterogeneous echotexture consistent with cirrhosis. No mass identified. The portal vein flow detected. Cannot exclude thrombosis or severely diminished  flow secondary to portal hypertension     BILARY TREE: No intrahepatic bile duct dilation.  Common bile duct measures within normal limits.     GALLBLADDER: Numerous stones and probable sludge present in the gallbladder. No wall thickening or pericholecystic fluid.     KIDNEYS: The right kidney is normal in echogenicity and size measuring 13.3 x 6.4 cm.  No evidence of hydronephrosis.  The left kidney is normal in echogenicity and size measuring 11.6 x 6.2 cm.  No evidence of hydronephrosis.     SPLEEN: Normal in appearance and size without evidence of mass.  .     IMPRESSION:     1.Cirrhosis.  2.Portal vein flow detected. Can not exclude thrombosis or severely diminished flow secondary to portal hypertension.  3.Cholelithiasis and probable gallbladder sludge without evidence of acute cholecystitis.           Radiologist location ID: [id number]           Exam Ended: 01/12/24 11:46 Last Resulted: 01/12/24 12:31

## 2024-01-18 ENCOUNTER — Other Ambulatory Visit: Payer: Self-pay

## 2024-01-18 ENCOUNTER — Ambulatory Visit (HOSPITAL_BASED_OUTPATIENT_CLINIC_OR_DEPARTMENT_OTHER): Payer: Self-pay | Admitting: Student in an Organized Health Care Education/Training Program

## 2024-01-18 ENCOUNTER — Ambulatory Visit
Admission: RE | Admit: 2024-01-18 | Discharge: 2024-01-18 | Disposition: A | Discharge status: Home / Self Care | Source: Ambulatory Visit | Attending: Student in an Organized Health Care Education/Training Program | Admitting: Student in an Organized Health Care Education/Training Program

## 2024-01-18 DIAGNOSIS — K7460 Unspecified cirrhosis of liver: Secondary | ICD-10-CM | POA: Insufficient documentation

## 2024-01-18 MED ORDER — IOPAMIDOL 370 MG IODINE/ML (76 %) INTRAVENOUS SOLUTION
110.0000 mL | INTRAVENOUS | Status: AC
Start: 2024-01-18 — End: 2024-01-18
  Administered 2024-01-18: 110 mL via INTRAVENOUS

## 2024-02-17 ENCOUNTER — Encounter (HOSPITAL_BASED_OUTPATIENT_CLINIC_OR_DEPARTMENT_OTHER): Payer: Self-pay

## 2024-02-17 ENCOUNTER — Telehealth (HOSPITAL_BASED_OUTPATIENT_CLINIC_OR_DEPARTMENT_OTHER): Payer: Self-pay
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
## 2024-02-17 NOTE — Telephone Encounter (Signed)
 MyChart message sent to patient with imaging results.

## 2024-02-18 ENCOUNTER — Ambulatory Visit: Payer: Self-pay | Attending: NURSE PRACTITIONER | Admitting: NURSE PRACTITIONER

## 2024-02-18 ENCOUNTER — Other Ambulatory Visit: Payer: Self-pay

## 2024-02-18 ENCOUNTER — Encounter (INDEPENDENT_AMBULATORY_CARE_PROVIDER_SITE_OTHER): Payer: Self-pay | Admitting: NURSE PRACTITIONER

## 2024-02-18 VITALS — BP 125/73 | HR 74 | Temp 97.0°F | Wt 226.8 lb

## 2024-02-18 DIAGNOSIS — R2689 Other abnormalities of gait and mobility: Secondary | ICD-10-CM | POA: Insufficient documentation

## 2024-02-18 DIAGNOSIS — Z8719 Personal history of other diseases of the digestive system: Secondary | ICD-10-CM

## 2024-02-18 DIAGNOSIS — Z8639 Personal history of other endocrine, nutritional and metabolic disease: Secondary | ICD-10-CM

## 2024-02-18 DIAGNOSIS — G20A2 Parkinson's disease without dyskinesia, with fluctuations: Secondary | ICD-10-CM | POA: Insufficient documentation

## 2024-02-18 NOTE — Patient Instructions (Addendum)
-   Start taking sinemet  25-100 mg 3 tablets at 6 am, 3 tablets 10 am, 4 tablets at 1-1:30 pm, 4 tablets 5 pm  - Potential side effects with the increase: lightheadedness/dizziness (because of lower blood pressure), nausea, hallucinations, dyskinesia (involuntary wiggly-like movements)    - If this increase doesn't work, we can always try adding Sinemet  CR during the day    - Continue Sinemet  CR 50-200 mg 1 tablet 6 am and 2 tablets nightly     - Can try melatonin 5 mg nightly to help with sleep and vivid dreams     - Physical therapy to help with freezing of gait

## 2024-02-21 ENCOUNTER — Other Ambulatory Visit (INDEPENDENT_AMBULATORY_CARE_PROVIDER_SITE_OTHER): Payer: Self-pay | Admitting: NURSE PRACTITIONER

## 2024-02-21 MED ORDER — CARBIDOPA 25 MG-LEVODOPA 100 MG TABLET
ORAL_TABLET | ORAL | 3 refills | Status: DC
Start: 2024-02-21 — End: 2024-05-17

## 2024-03-05 ENCOUNTER — Other Ambulatory Visit: Payer: Self-pay

## 2024-03-06 ENCOUNTER — Ambulatory Visit: Payer: Medicare Other

## 2024-03-06 ENCOUNTER — Encounter (HOSPITAL_BASED_OUTPATIENT_CLINIC_OR_DEPARTMENT_OTHER): Payer: Self-pay

## 2024-03-06 ENCOUNTER — Other Ambulatory Visit: Payer: Self-pay

## 2024-03-06 VITALS — BP 138/69 | HR 70 | Temp 97.5°F | Resp 16 | Ht 68.0 in | Wt 226.4 lb

## 2024-03-06 DIAGNOSIS — K7469 Other cirrhosis of liver: Secondary | ICD-10-CM | POA: Insufficient documentation

## 2024-03-06 DIAGNOSIS — D696 Thrombocytopenia, unspecified: Secondary | ICD-10-CM | POA: Insufficient documentation

## 2024-03-06 DIAGNOSIS — K766 Portal hypertension: Secondary | ICD-10-CM

## 2024-03-06 DIAGNOSIS — D6959 Other secondary thrombocytopenia: Secondary | ICD-10-CM

## 2024-03-06 DIAGNOSIS — K219 Gastro-esophageal reflux disease without esophagitis: Secondary | ICD-10-CM

## 2024-03-06 DIAGNOSIS — Z79899 Other long term (current) drug therapy: Secondary | ICD-10-CM

## 2024-03-06 DIAGNOSIS — I8510 Secondary esophageal varices without bleeding: Secondary | ICD-10-CM

## 2024-03-06 DIAGNOSIS — R161 Splenomegaly, not elsewhere classified: Secondary | ICD-10-CM

## 2024-03-06 DIAGNOSIS — K760 Fatty (change of) liver, not elsewhere classified: Secondary | ICD-10-CM | POA: Insufficient documentation

## 2024-03-06 DIAGNOSIS — E559 Vitamin D deficiency, unspecified: Secondary | ICD-10-CM | POA: Insufficient documentation

## 2024-03-06 DIAGNOSIS — I8500 Esophageal varices without bleeding: Secondary | ICD-10-CM | POA: Insufficient documentation

## 2024-03-06 DIAGNOSIS — K7682 Hepatic encephalopathy: Secondary | ICD-10-CM | POA: Insufficient documentation

## 2024-03-06 DIAGNOSIS — K7460 Unspecified cirrhosis of liver: Secondary | ICD-10-CM | POA: Insufficient documentation

## 2024-03-06 MED ORDER — PANTOPRAZOLE 40 MG TABLET,DELAYED RELEASE
40.0000 mg | DELAYED_RELEASE_TABLET | Freq: Every day | ORAL | 1 refills | Status: AC
Start: 2024-03-06 — End: 2024-09-02

## 2024-03-06 MED ORDER — LACTULOSE 10 GRAM/15 ML ORAL SOLUTION
30.0000 mL | Freq: Three times a day (TID) | ORAL | 1 refills | Status: AC
Start: 2024-03-06 — End: 2024-09-02

## 2024-03-06 NOTE — Nursing Note (Signed)
 Patient here for follow up with no complaints

## 2024-03-06 NOTE — Progress Notes (Signed)
 [facility]                                                      Department of Gastroenterology  [address]  [place], [place] [address]    Date:   03/06/2024  Name: Jacob Santiago  Age: 68 y.o.    Referring Provider:    Malen Scudder, MD  [address]  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, thrombocytopenia      Clinic visit   Feb-12-24   =================  Patient is here for new visit   Patient has history of fatty liver disease, nonalcoholic  Denies any current or previous alcohol drinking  No family history of liver disease or GI cancer  Was diagnosed with liver cirrhosis around 10 years ago, he was following with previous GI doctor who is retired now  Patient lives 3 hours from here  Last EGD long time ago showed varices, no banding, no report available  Colonoscopy in 2020, denies any previous polyps, no family history of colon cancer, no report available, patient due for surveillance    Has thrombocytopenia thrombocytopenia,   Heartburn controlled with Protonix  Patient taking meloxicam as needed that was stopped recently  Patient on losartan, instructed to stop  Patient taking carvedilol  6.125 mg b.i.d.    No previous ascites or paracentesis, no previous variceal bleeding  Has history of elevated ammonia and hepatic encephalopathy, he ran out of lactulose     The patient denies any  difficulty with swallowing, abdominal pain, nausea, vomiting,  abdominal bloating, constipation, diarrhea, rectal bleeding, melena, hematemesis, unintentional weight loss or other complaints.        Clinic visit  August-1-24    =================  Patient is here for follow up   Needs refill for Carafate alone  Heartburn controlled with Protonix  No signs of bleeding, no recurrence of encephalopathy, no signs of ascites  Underwent EGD colonoscopy June 2024 showed grade 1 esophageal varices not amenable for banding, no esophagitis, moderate portal hypertensive gastropathy, no gastric varices, unremarkable stomach biopsy negative for H pylori    Colonoscopy with good prep showed hyperplastic polyps, nonbleeding internal and external hemorrhoids, recommended to repeat after 7 years    Chronic liver disease workup February 2024 showed unremarkable alpha-1 antitrypsin, ceruloplasmin, mitochondrial antibody, smooth muscle antibody, iron studies, acute hepatitis panel, celiac disease screening.  Patient immune to hepatitis-A and B    Ultrasound March 2024 showed no liver lesion, evidence of splenomegaly, cirrhosis AFP February 2024 unremarkable    The patient denies any  difficulty with swallowing, abdominal pain, nausea, vomiting, abdominal bloating, constipation, diarrhea, rectal bleeding, melena, hematemesis, unintentional weight loss or other complaints.    Clinic Visit on 03/06/2024:  [name] is here today for follow-up.  Patient denies any GI symptoms or concerns at this  time.  Gastroesophageal reflux disease is well controlled with PPIs.  Most recent ultrasound in 12/2023 showed portal vein for detected but could not exclude thrombosis or severely diminished flow secondary to portal hypertension.  CT venogram on 01/18/2024 showed no evidence of portal vein thrombosis.    Review of Systems  See HPI.  No chest pain or sob  No fever or chills     Past Medical History: He  has a past medical history of BPH (benign prostatic hyperplasia), CPAP (continuous positive airway pressure)  dependence, Esophageal reflux, Essential hypertension, Hepatic cirrhosis, Nonalcoholic fatty liver disease, Parkinson's disease, Sleep apnea, and Thrombocytopenia, unspecified (CMS HCC).      Past Surgical History: He  has a past surgical history that includes hx hernia repair (Right); hx hand surgery; hx colonoscopy; and colonoscopy (04/06/2023).    Social History: His  reports that he has never smoked. He has been exposed to tobacco smoke. He has never used smokeless tobacco. He reports that he does not drink alcohol and does not use drugs., ,     Family History: He family history is not on file.    Allergies: He is allergic to nirmatrelvir-ritonavir and simvastatin.    Home Medication:   Current Outpatient Medications   Medication Sig    bisacodyL  (DULCOLAX) 5 mg Oral Tablet, Delayed Release (E.C.) TAKE ALL FOUR TABLETS AT 5 PM EVENING PRIOR TO PROCEDURE    carbidopa -levodopa  (SINEMET  CR) 50-200 mg Oral Tablet Sustained Release TAKE 1 TABLET EVERY MORNING AND TAKE 2 TABLETS EVERY EVENING    carbidopa -Levodopa  (SINEMET ) 25-100 mg Oral Tablet Take 3 tablets by mouth at 6 am, 3 tablets at 10 am, 4 tablets at 1-1:30 pm, and 4 tablets at 5 pm    carvediloL  (COREG ) 6.25 mg Oral Tablet Take 1 Tablet (6.25 mg total) by mouth Twice daily for 90 days    ergocalciferol , vitamin D2, (VITAMIN D ) 1,250 mcg (50,000 unit) Oral Capsule Take 1 Capsule (50,000 Units total) by mouth Every 7 days for 123 doses Indications: low vitamin D  levels    lactulose  (ENULOSE ) 10 gram/15 mL Oral Solution Take 30 mL by mouth Three times a day Indications: impaired brain function due to liver disease    MYRBETRIQ 25 mg Oral Tablet Sustained Release 24 hr TAKE ONE TABLET BY MOUTH EVERY DAY SWALLOWING WHOLE WITH WATER, DO NOT CRUSH CHEW AND OR DIVIDE    pantoprazole (PROTONIX) 40 mg Oral Tablet, Delayed Release (E.C.) Take 1 tablet every day by oral route as needed.    tamsulosin (FLOMAX) 0.4 mg Oral Capsule Take 1 capsule every day by oral route.        Examination:  BP 138/69   Pulse 70   Temp 36.4 C (97.5 F) (Temporal)   Resp 16   Ht 1.727 m (5\' 8" )   Wt 103 kg (226 lb 6.6 oz)   SpO2 97%   BMI 34.43 kg/m        Wt Readings from Last 3 Encounters:   03/06/24 103 kg (226 lb 6.6 oz)   02/18/24 103 kg (226 lb 12.8 oz)   05/27/23 99.8 kg (220 lb 0.3 oz)       General: Resting comfortably, no acute distress.  Eyes: Conjunctiva normal, sclera non-icteric.  HENT: Atraumatic and normocephalic.   Neck:  Supple.   Lungs: Breathing comfortably.  No respiratory distress.  Abdomen: soft lax, not tender   Extremities: No cyanosis or edema  Neurologic: Alert, Oriented, Grossly normal  Psychiatric: Appears normal  Data/Chart/Labs/Imaging reviewed:  -CBC January 2024 WBC 3.4, hemoglobin 13.8, platelets 80  -INR January 1024 was 1.34  -BMP January 2024 unremarkable  -liver panel January 2024 total bilirubin 1.9, AST 49, otherwise unremarkable  -ammonia level January 2024 140  -A1c January 2024 5.9  -hepatitis panel in January 2024 showed positive hepatitis-A IgG, negative hepatitis-B surface antigen, negative hepatitis-c antibody, positive hepatitis-B surface antibody  -modified barium swallow in May 2022 showed no hiatal hernia, no evidence of gastroesophageal reflux disease, few tubular filling defect in the lower 3rd of the esophagus compatible with esophageal varices, mild esophageal dysmotility    -EGD April 06, 2023 showed grade 1 esophageal varices in the distal esophagus without stigmata, not amenable for banding.  Regular Z-line at 40 cm, no esophagitis.  Moderate portal hypertensive gastropathy, no gastric varices, normal retroflexion, normal bulb and 2nd portion of duodenum, unremarkable stomach biopsy negative for H pylori    -colonoscopy June 11th 2024 with adequate prep showed nonthrombosed external hemorrhoids on perianal exam, 5 mm polyp was removed, nonbleeding internal and external hemorrhoids, pathology showed hyperplastic polyp, recommended to  repeat colonoscopy after 7 years    Abdominal ultrasound on 01/12/2024:    Cirrhosis.  Portal vein flow detected.  Cannot exclude thrombosis or severely diminished flow secondary to portal hypertension.  Cholelithiasis and probable gallbladder sludge without evidence of acute cholecystitis.    CT venogram AP on 01/18/2024:  Cirrhotic liver with large varices in the left upper and mid abdomen.  No evidence of portal vein thrombosis.  Cholelithiasis.    Labs on 01/19/2023:  Remarkable for WBC 2.4, platelet count 71, RBC 3.98, creatinine 0.74, albumin 3.2, AST 48    -Chronic liver disease workup February 2024 showed unremarkable alpha-1 antitrypsin, ceruloplasmin, mitochondrial antibody, smooth muscle antibody, iron studies, acute hepatitis panel, celiac disease screening.  Patient immune to hepatitis-A and B  _________________________________________________________________________  Assessment and Plan  Jacob Santiago is a 68 y.o. 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  Up-to-date for [facility] screening.  Most recent abdominal ultrasound on 01/12/2023 showed portal vein flow but could not exclude thrombosis or severely diminished flow secondary to portal hypertension.  CT venogram AP on 01/18/2024 showed no evidence of portal vein thrombosis.  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        My recommendations are as follows:    -I personally reviewed previous GI workup/procedures/labs and relevant imaging as stated above  Repeat EGD June 2025 for surveillance, ordered.  Next colonoscopy due June 2031  Immune to hepatitis-A and B  Continue HCC screening with ultrasound and AFP every 6 months, ordered.   Unremarkable screening tests for alpha-1 antitrypsin, ceruloplasmin, mitochondrial antibody, smooth muscle antibody, tTG IgA, total IgA level, iron studies, viral hepatitis panel.  Continue vitamin-D 50000 units weekly for 12 weeks, will repeat levels.  Ordered.  Check CBC, BMP, LFTs and INR  Will continue lactulose  t.i.d., titrate to 3 bowel movements per day.  Refilled.  Per patient losartan was discontinued in the setting of cirrhosis.  Avoid NSAIDs  Continue Protonix 40 mg p.o. daily.  Take it 30 minutes before breakfast.  Refilled.  Continue carvedilol  6.25 mg p.o. b.i.d. for primary prophylaxis of variceal bleeding  I have discussed the risks and benefits of EGD.  Risks include bleeding, infection, perforation and risk of anesthesia.  Patient verbalized understanding and is willing to move forward.    Education Regarding Cirrhosis  - The etiology and natural history of cirrhosis was discussed with the patient.  This included the risk of progression to complications of end stage liver disease.  - Good liver care was discussed with the patient.  This includes avoiding hepatotoxic substances.  Specifically, no more than 2g of tylenol should be consumed a day.      Cirrhosis Guidelines:     Tylenol (acetaminophen) not to exceed 2 gms or 4 extra strength tablets in a 24 hour period.  Check all medications - many combination products contain acetaminophen.  If possible, avoid all aspirin and nonsteroidal products (Aleve, Motrin, Celebrex, etc.)  No herbal products.  No raw  seafood or hamburger.  Avoid skin contact with sea water or beach sand in warm months.  Complete abstinence from all alcohol and illegal drugs.  No smoking.  Multivitamin and calcium (1000-1500 mg) with vitamin D  daily.  2 gram sodium diet.  This means avoid very salty foods such as canned soup and pickled goods.  Do not use salt substitute, but Mrs. Dash and lemon juice are safe.  Cirrhosis complications include liver failure and liver cancer.  You should have regular medical check ups and screening for liver cancer (blood test and scan) every three to six months.  Notify us  immediately if you have any of the following  Eyes turn yellow  You become confused  Your belly starts to swell  You vomit blood (you should go to the nearest emergency room)      The patient was given the opportunity to ask questions and those questions were appropriately answered. The patient agreed with the treatment plan and is encouraged to call with any additional questions or concerns.     Return to the clinic in 6 months    Orders Placed This Encounter    UPPER ENDOSCOPY ([place])    VITAMIN D  25 TOTAL    CBC/DIFF    HEPATIC FUNCTION PANEL    PT/INR    ALPHA FETOPROTEIN (AFP) TUMOR MARKER    BASIC METABOLIC PANEL    lactulose  (ENULOSE ) 10 gram/15 mL Oral Solution    pantoprazole (PROTONIX) 40 mg Oral Tablet, Delayed Release (E.C.)       Olive Better, APRN,NP-C  [facility] - Gastroenterology  [place], [place]  [address]  Attending attestation    I did not personally see or examine the patient.The patient was independently seen and examined by  Olive Better, APRN,NP-C.  The assessment, plan of care, and recommendations were formulated by Olive Better, APRN,NP-C.       Jacob Coop, MD  [facility]  [place], [place] [address]      Note: A portion of this documentation was generated using MMODAL (voice recognition software) and may contain syntax/voice recognition errors.

## 2024-03-16 ENCOUNTER — Other Ambulatory Visit (HOSPITAL_COMMUNITY): Payer: Self-pay | Admitting: Nurse Practitioner

## 2024-03-16 ENCOUNTER — Other Ambulatory Visit
Admission: RE | Admit: 2024-03-16 | Discharge: 2024-03-16 | Disposition: A | Discharge status: Home / Self Care | Source: Ambulatory Visit | Attending: Nurse Practitioner | Admitting: Nurse Practitioner

## 2024-03-16 DIAGNOSIS — E785 Hyperlipidemia, unspecified: Secondary | ICD-10-CM

## 2024-03-16 LAB — HGA1C (HEMOGLOBIN A1C WITH EST AVG GLUCOSE)
ESTIMATED AVERAGE GLUCOSE: 131 mg/dL
HEMOGLOBIN A1C: 6.2 % — ABNORMAL HIGH (ref 4.3–6.1)

## 2024-03-16 LAB — LIPID PANEL
CHOL/HDL RATIO: 3.1
CHOLESTEROL: 147 mg/dL (ref 100–200)
HDL CHOL: 48 mg/dL — ABNORMAL LOW (ref >=50)
LDL CALC: 86 mg/dL (ref <100)
NON-HDL: 99 mg/dL (ref <=190)
TRIGLYCERIDES: 64 mg/dL (ref <150)
VLDL CALC: 10 mg/dL (ref <30)

## 2024-03-29 ENCOUNTER — Telehealth (HOSPITAL_BASED_OUTPATIENT_CLINIC_OR_DEPARTMENT_OTHER): Payer: Self-pay | Admitting: Pulmonary Disease

## 2024-03-29 NOTE — Telephone Encounter (Signed)
 L/m for patient regarding his CT chest its on 04/01/2024 @1015  AM

## 2024-03-30 ENCOUNTER — Encounter (HOSPITAL_COMMUNITY): Payer: Self-pay | Admitting: Student in an Organized Health Care Education/Training Program

## 2024-04-01 ENCOUNTER — Ambulatory Visit (HOSPITAL_COMMUNITY): Payer: Self-pay

## 2024-04-05 ENCOUNTER — Encounter (HOSPITAL_COMMUNITY)
Admission: RE | Disposition: A | Discharge status: Home / Self Care | Payer: Self-pay | Source: Ambulatory Visit | Attending: Student in an Organized Health Care Education/Training Program

## 2024-04-05 ENCOUNTER — Encounter (HOSPITAL_COMMUNITY): Payer: Self-pay | Admitting: Student in an Organized Health Care Education/Training Program

## 2024-04-05 ENCOUNTER — Ambulatory Visit
Admission: RE | Admit: 2024-04-05 | Discharge: 2024-04-05 | Disposition: A | Discharge status: Home / Self Care | Payer: Self-pay | Source: Ambulatory Visit | Attending: Student in an Organized Health Care Education/Training Program | Admitting: Student in an Organized Health Care Education/Training Program

## 2024-04-05 ENCOUNTER — Other Ambulatory Visit: Payer: Self-pay

## 2024-04-05 ENCOUNTER — Ambulatory Visit (HOSPITAL_COMMUNITY): Admitting: General Acute Care Hospital

## 2024-04-05 DIAGNOSIS — K760 Fatty (change of) liver, not elsewhere classified: Secondary | ICD-10-CM | POA: Insufficient documentation

## 2024-04-05 DIAGNOSIS — G4730 Sleep apnea, unspecified: Secondary | ICD-10-CM | POA: Insufficient documentation

## 2024-04-05 DIAGNOSIS — I10 Essential (primary) hypertension: Secondary | ICD-10-CM | POA: Insufficient documentation

## 2024-04-05 DIAGNOSIS — R911 Solitary pulmonary nodule: Secondary | ICD-10-CM | POA: Insufficient documentation

## 2024-04-05 DIAGNOSIS — Z6834 Body mass index (BMI) 34.0-34.9, adult: Secondary | ICD-10-CM | POA: Insufficient documentation

## 2024-04-05 DIAGNOSIS — K766 Portal hypertension: Secondary | ICD-10-CM

## 2024-04-05 DIAGNOSIS — K7460 Unspecified cirrhosis of liver: Secondary | ICD-10-CM

## 2024-04-05 DIAGNOSIS — I8510 Secondary esophageal varices without bleeding: Secondary | ICD-10-CM | POA: Insufficient documentation

## 2024-04-05 DIAGNOSIS — G20A1 Parkinson's disease without dyskinesia, without mention of fluctuations: Secondary | ICD-10-CM | POA: Insufficient documentation

## 2024-04-05 DIAGNOSIS — K3189 Other diseases of stomach and duodenum: Secondary | ICD-10-CM | POA: Insufficient documentation

## 2024-04-05 DIAGNOSIS — K219 Gastro-esophageal reflux disease without esophagitis: Secondary | ICD-10-CM | POA: Insufficient documentation

## 2024-04-05 DIAGNOSIS — E669 Obesity, unspecified: Secondary | ICD-10-CM | POA: Insufficient documentation

## 2024-04-05 DIAGNOSIS — Z79899 Other long term (current) drug therapy: Secondary | ICD-10-CM | POA: Insufficient documentation

## 2024-04-05 SURGERY — GASTROSCOPY
Anesthesia: General | Wound class: Clean Contaminated Wounds-The respiratory, GI, Genital, or urinary

## 2024-04-05 MED ORDER — LACTATED RINGERS INTRAVENOUS SOLUTION
INTRAVENOUS | Status: DC
Start: 2024-04-05 — End: 2024-04-05
  Administered 2024-04-05: 1000 mL via INTRAVENOUS

## 2024-04-05 MED ORDER — LIDOCAINE (PF) 100 MG/5 ML (2 %) INTRAVENOUS SYRINGE
INJECTION | Freq: Once | INTRAVENOUS | Status: DC | PRN
Start: 2024-04-05 — End: 2024-04-05
  Administered 2024-04-05: 100 mg via INTRAVENOUS

## 2024-04-05 MED ORDER — PROPOFOL 10 MG/ML IV BOLUS
INJECTION | Freq: Once | INTRAVENOUS | Status: DC | PRN
Start: 2024-04-05 — End: 2024-04-05
  Administered 2024-04-05: 100 mg via INTRAVENOUS
  Administered 2024-04-05: 50 mg via INTRAVENOUS

## 2024-04-05 MED ORDER — SIMETHICONE 40 MG/0.6 ML ORAL DROPS,SUSPENSION
Freq: Once | ORAL | Status: DC | PRN
Start: 2024-04-05 — End: 2024-04-05
  Administered 2024-04-05: 40 mg

## 2024-04-05 MED ORDER — SODIUM CHLORIDE 0.9 % (FLUSH) INJECTION SYRINGE
3.0000 mL | INJECTION | Freq: Three times a day (TID) | INTRAMUSCULAR | Status: DC
Start: 2024-04-05 — End: 2024-04-05

## 2024-04-05 SURGICAL SUPPLY — 19 items
BLOCK BITE 60FR ADULT STRAP FLXB SH LF  DISP (ENDOSCOPIC SUPPLIES) ×1 IMPLANT
BRUSH CYTO 1.5MM 140CM BLT TIP ERG HNDL ATO STP SHEATH CLBR STRL DISP PTFE BRONCHSCP (ENDOSCOPIC SUPPLIES) IMPLANT
CATH ELHMST INJ GLD PROBE 7FR 25GA .24MM 210CM BIPOLAR RND DIST TIP STD CONN INTGR DISP 2.8MM MN WRK (ENDOSCOPIC SUPPLIES) IMPLANT
CLIP HMST MR CONDITIONAL BRD CATH ROT CONTROL KNOB NO SHEATH RSL 360 235CM 2.8MM 11MM OPN (ENDOSCOPIC SUPPLIES) IMPLANT
FORCEPS BIOPSY HOT 240CM 2.2MM RJ 4 +2.8MM DISPO (ENDOSCOPIC SUPPLIES) IMPLANT
FORCEPS BIOPSY LRG CPC NEEDLE 240CM 2.2MM RJ 3 DISP ORNG (ENDOSCOPIC SUPPLIES) IMPLANT
FORCEPS BIOPSY NEEDLE 160CM 1.8MM RJ 4 DISP YW 2MM WRK CHNL GSPED (MED SURG SUPPLIES) IMPLANT
FORCEPS BIOPSY NEEDLE 240CM RJ 4 JMB (ENDOSCOPIC SUPPLIES) IMPLANT
GW ENDOSCOPIC .035IN 260CM DREAMWIRE ANG RX EGLD NITINOL BIL STRL DISP (ENDOSCOPIC SUPPLIES) IMPLANT
KIT RM TURNOVER STPC DISP (DRAPE/PACKS/SHEETS/OR TOWEL) IMPLANT
LIGATOR 2.8MM 8.6-11.5MM SSS7 ESOPH 1 STNG MLT BAND HNDL STRL DISP ENDOS HMSTS LF (ENDOSCOPIC SUPPLIES) IMPLANT
MARKER ENDOS PMT ENDOMARK DIL INDIA INK 10ML STRL (MED SURG SUPPLIES) IMPLANT
MARKER ENDOS SPOT EX PERM IND DRK SYRG 5ML (MED SURG SUPPLIES) IMPLANT
NEEDLE SCLRTX 25GA 2.3MM BVL STRL DISP STAR CATH INTJCT 4MM 240CM (ENDOSCOPIC SUPPLIES) IMPLANT
NET SPEC RETR 230CM 2.5MM RTHNT STD SHEATH 6X3CM NONST LF  DISP (ENDOSCOPIC SUPPLIES) IMPLANT
PROBE ESURG 220CM 2.3MM FIAPC FLXB STR FIRE STRL DISP (SURGICAL CUTTING SUPPLIES) IMPLANT
SNARE 230CM 2.5MM 3CM PLPK ROTR RTHNT ENDOS NONST LF  DISP (ENDOSCOPIC SUPPLIES) IMPLANT
SNARE MED OVAL 240CM 2.4MM CAPTIVATR STF ENDOS PLYP 27MM DISP (ENDOSCOPIC SUPPLIES) IMPLANT
TRAP MUCUS PLASTIC SCREW CAP TUBE ID LBL STRL LF  CLR (SPECIMEN COLLECTION SUPPLIES) IMPLANT

## 2024-04-05 NOTE — H&P (Signed)
 [facility]  Operating Room H&P/Update    Name: Jacob Santiago  Age: 68 y.o.    Primary Care Provider:  Roxan Copes, NP    HPI:    Jacob Santiago is a pleasent 68 y.o. 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  Up-to-date for [facility] screening.  Most recent abdominal ultrasound on 01/12/2023 showed portal vein flow but could not exclude thrombosis or severely diminished flow secondary to portal hypertension.  CT venogram AP on 01/18/2024 showed no evidence of portal vein thrombosis.  Gastroesophageal reflux disease without esophagitis controlled with Protonix     Patient is planned for EGD today as previously recommended. There is no change in interval history from prior visit.       Review of Systems:  Constitutional: Denies fevers, chills  Cardiovascular: Denies chest pain  Neurological: Denies seizures    Past Medical History  Past Medical History:   Diagnosis Date    BPH (benign prostatic hyperplasia)     CPAP (continuous positive airway pressure) dependence     Esophageal reflux     Essential hypertension     Hepatic cirrhosis     Nonalcoholic fatty liver disease     Parkinson's disease     Sleep apnea     Thrombocytopenia, unspecified (CMS HCC)          Past Surgical History:   Procedure Laterality Date    COLONOSCOPY  04/06/2023    HX COLONOSCOPY      HX HAND SURGERY      HX HERNIA REPAIR Right     mesh    HX LOBECTOMY Left 09/29/2023    left lower lung, 1/4 removed due to nodule at [facility]         Family Medical History:    None         Social History     Socioeconomic History    Marital status: Married   Tobacco Use    Smoking status: Never      Passive exposure: Past    Smokeless tobacco: Never   Vaping Use    Vaping status: Never Used   Substance and Sexual Activity    Alcohol use: Never    Drug use: Never     @MEDSTAKING @  Allergies[1]    Examination:  BP (!) 133/94   Pulse 54   Temp 36 C (96.8 F)   Resp 18   Ht 1.727 m (5' 8)   Wt 101 kg (223 lb 9.6 oz)   SpO2 98%   BMI 34.00 kg/m        Wt Readings from Last 2 Encounters:   04/05/24 101 kg (223 lb 9.6 oz)   03/06/24 103 kg (226 lb 6.6 oz)     Head: Atraumatic and normocephalic  Eyes: Conjunctiva clear, sclera non-icteric  Neck:  Supple  Lungs: Normal reparatory effort bilaterally  Cardiovascular: Regular rate  Neurologic: Alert,  Grossly normal  Psychiatric: Appears normal    CBC Results Coags Results   No results for input(s): WBC, HGB, HCT, PLTCNT, BANDS in the last 72 hours.    Invalid input(s): PLATELETCOUNT No results for input(s): INR, PROTHROMTME, APTT in the  last 72 hours.    Invalid input(s): PTT, PT, CREACTPROTIE   BMP Results Other Chemistries Results   No results for input(s): SODIUM, POTASSIUM, CHLORIDE, CO2, BUN, CREATININE, GFR, ANIONGAP in the last 72 hours. No results for input(s): CALCIUM, ALBUMIN, MAGNESIUM, PHOSPHORUS in the last 72 hours.   Liver/Pancreas Enzyme Results Blood Gas     No results for input(s): TOTALPROTEIN, ALBUMIN, PREALBUMIN, AST, ALT, ALKPHOS, LDH, AMYLASE, LIPASE in the last 72 hours.    Invalid input(s): GGT No results found for this encounter     Assessment  Jacob Santiago is a 68 y.o. male is seen in the endoscopy unit for endoscopic procedures.    Plan  Informed consent was obtained.      Discussed with patient the consent for EGD. Alternative to endoscopy were also discussed. The risks/benefits and complications including but not limited to anesthesia, bleeding, dental injury, aspiration, CV/pulmonary risk, perforation, infection, missed lesions, need for surgery & remote  possibility of death were discussed with patient who understand and agreed to proceed.   Proceed to endoscopy procedure as previously planned.     Charmaine Coop, MD  [facility] - Gastroenterology  [place], [place]  [address]         [1]   Allergies  Allergen Reactions    Nirmatrelvir-Ritonavir Rash    Simvastatin Nausea/ Vomiting     Caused numbness

## 2024-04-05 NOTE — Anesthesia Postprocedure Evaluation (Signed)
 Anesthesia Post Op Evaluation    Patient: Jacob Santiago  Procedure(s):  GASTROSCOPY    Last Vitals:Temperature: 36 C (96.8 F) (04/05/24 0914)  Heart Rate: 57 (04/05/24 1009)  BP (Non-Invasive): 120/70 (04/05/24 1009)  Respiratory Rate: 20 (04/05/24 1009)  SpO2: 100 % (04/05/24 1009)    No notable events documented.    Patient is sufficiently recovered from the effects of anesthesia to participate in the evaluation and has returned to their pre-procedure level.  Patient location during evaluation: bedside       Patient participation: complete - patient participated  Level of consciousness: awake and alert and responsive to verbal stimuli    Pain score: 0  Pain management: adequate  Airway patency: patent    Anesthetic complications: no  Cardiovascular status: acceptable  Respiratory status: acceptable  Hydration status: acceptable  Patient post-procedure temperature: Pt Normothermic   PONV Status: Absent

## 2024-04-05 NOTE — Anesthesia Preprocedure Evaluation (Addendum)
 ANESTHESIA PRE-OP EVALUATION  Planned Procedure: GASTROSCOPY  Review of Systems     anesthesia history negative     patient summary reviewed          Pulmonary   Lung nodule and sleep apnea,   Cardiovascular    Hypertension , Exercise Tolerance: > or = 4 METS   ,beta blocker therapy  ,taken in last 24 hours     GI/Hepatic/Renal    GERD, liver disease (cirrhosis) and esophageal disease (varices)        Endo/Other    obesity,      Neuro/Psych/MS    Parkinson's disease, neuromuscular disease (parkinson's)     Cancer    negative hematology/oncology ROS,                     Physical Assessment      Airway       Mallampati: III    TM distance: <3 FB    Neck ROM: limited  Mouth Opening: fair.            Dental           (+) caps           Pulmonary    Comment: Lung nodule  Breath sounds clear to auscultation  (-) no rhonchi, no decreased breath sounds, no wheezes, no rales and no stridor     Cardiovascular    Rhythm: regular  Rate: Normal  (-) no friction rub, carotid bruit is not present and no murmur     Other findings              Plan  ASA 3     Planned anesthesia type: general     total intravenous anesthesia                    Intravenous induction     Anesthesia issues/risks discussed are: Dental Injuries, PONV, Cardiac Events/MI, Blood Loss, Stroke, Intraoperative Awareness/ Recall, Aspiration, Difficult Airway and Sore Throat.  Anesthetic plan and risks discussed with patient  signed consent obtained          Patient's NPO status is appropriate for Anesthesia.           Plan discussed with CRNA.

## 2024-05-17 ENCOUNTER — Other Ambulatory Visit (INDEPENDENT_AMBULATORY_CARE_PROVIDER_SITE_OTHER): Payer: Self-pay | Admitting: NURSE PRACTITIONER

## 2024-05-17 MED ORDER — CARBIDOPA 25 MG-LEVODOPA 100 MG TABLET
ORAL_TABLET | ORAL | 3 refills | Status: AC
Start: 2024-05-17 — End: ?

## 2024-05-17 MED ORDER — CARBIDOPA ER 50 MG-LEVODOPA 200 MG TABLET,EXTENDED RELEASE
ORAL_TABLET | ORAL | 3 refills | Status: AC
Start: 2024-05-17 — End: ?

## 2024-05-17 NOTE — Telephone Encounter (Signed)
 Fax received from [facility] requesting Rx refill of Carbidopa - Levodopa . Message sent to provider.
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
## 2024-05-24 ENCOUNTER — Encounter (INDEPENDENT_AMBULATORY_CARE_PROVIDER_SITE_OTHER): Payer: Self-pay | Admitting: Rehabilitative and Restorative Service Providers"

## 2024-05-24 ENCOUNTER — Ambulatory Visit: Payer: Self-pay | Attending: NURSE PRACTITIONER | Admitting: Rehabilitative and Restorative Service Providers"

## 2024-05-24 ENCOUNTER — Ambulatory Visit (HOSPITAL_BASED_OUTPATIENT_CLINIC_OR_DEPARTMENT_OTHER): Payer: Self-pay | Admitting: NURSE PRACTITIONER

## 2024-05-24 ENCOUNTER — Other Ambulatory Visit: Payer: Self-pay

## 2024-05-24 VITALS — BP 90/58 | HR 74 | Temp 98.2°F | Ht 67.99 in | Wt 222.8 lb

## 2024-05-24 DIAGNOSIS — R2689 Other abnormalities of gait and mobility: Secondary | ICD-10-CM | POA: Insufficient documentation

## 2024-05-24 DIAGNOSIS — F0390 Unspecified dementia without behavioral disturbance: Secondary | ICD-10-CM | POA: Insufficient documentation

## 2024-05-24 DIAGNOSIS — F32A Depression, unspecified: Secondary | ICD-10-CM | POA: Insufficient documentation

## 2024-05-24 DIAGNOSIS — F419 Anxiety disorder, unspecified: Secondary | ICD-10-CM

## 2024-05-24 DIAGNOSIS — G20B2 Parkinson's disease with dyskinesia, with fluctuations: Secondary | ICD-10-CM

## 2024-05-24 DIAGNOSIS — G20A2 Parkinson's disease without dyskinesia, with fluctuations: Secondary | ICD-10-CM | POA: Insufficient documentation

## 2024-05-24 NOTE — Progress Notes (Signed)
 Subjective:  Patient reports to physical therapy for assessment of balance, gait, and postural stability.   Has trouble with freezing, all of a sudden I'm just frozen.  If he focuses on getting through this.  Happens several times a day.    Routine, gets up at 5:30am, takes medications and then exercises a little bit and gets dressed.  About 10-15 minutes of exercise and he is able to walk without much trouble.  Has control of his legs at that point.  Walks out to the greenhouse at times, waters the plants.  He is seeing a local physical therapist for his neck currently.        Objective:  Education:         Moving with Parkinson's Disease    Focusing on Feeling  When you try to focus on two things at once, your brain mostly pays attention to thinking and analyzing rather than feeling and moving. This is really important to know if you want to control your body better, especially if you have trouble moving because of a condition.  Your brain likes to think about the past and the future a lot. But this can make it hard to feel what your body is doing right now. And feeling what your body is doing right now is the only way you can change it.     Posture and Balance  If someone has Parkinson's disease, they might have problems with their posture and balance. But everyone's posture is a little different because of their habits and past experiences. If someone leans forward too much, for example, they might feel off-balance and start shuffling their feet when they walk.  To fix this, they need to pay attention to their body right now. If they notice they're leaning too far forward, they can try to stand up straighter. It's like finding a balance point. If they're falling forward to the right, they need to correct their posture by leaning back and to the left.     Recognizing and Redirecting  It's important to understand that being aware of your body in the present moment helps you control it better. People with  Parkinson's disease often have trouble with this because the condition affects their ability to feel their body. But with practice, they can get better at it.  When you catch yourself not paying attention to your body and feeling distracted, you can bring your focus back to your posture. Every time you do this, it's like training your brain to pay attention to your body more. Eventually, it becomes a habit, and you'll automatically correct your posture without even thinking about it.  It's okay if you don't get it right the first time. Making mistakes is part of learning. Every time you notice that you're not paying attention to your body and then fix it, that's a success. Don't think of it as failing, because that will just make it harder to improve. Just keep practicing, and over time, you'll get better at it.              The Mind-Body Connection: Understanding Parkinson's Disease    How the Mind Affects the Body:  Our mind tends to focus on past experiences or worry about the future.  Everyone has habits and thoughts based on their own life experiences.  People with Parkinson's disease often find it hard to focus on more than one thing at a time. This means that their mind might get distracted, affecting their  movements.  Common Issues in Parkinson's Disease:  When someone with Parkinson's has trouble moving, like freezing or falling, they might think it's just part of the disease. But sometimes, it's because their mind was distracted by their thoughts instead of focusing on their body.  Parkinson's is often called a progressive disease, meaning it gets worse over time. This can make people feel like they have no control over their health. But this isn't always true! New research shows that there is a connection between the mind and body that can help people improve.  How Negative Thoughts Affect the Body:  If someone with Parkinson's has a bad night's sleep, they might wake up feeling tired and worry that the  disease is getting worse. This worry can make them feel even worse and affect how their body works.  Negative thoughts can cause the body to release stress hormones. These hormones can make the symptoms of Parkinson's worse, creating a cycle of feeling bad and losing control over the body.  Breaking the Cycle:  Learning to pay attention to the body can help regain control.  For example, when someone with Parkinson's is about to sit down, they might think about sitting before their body is ready. This can lead to poor balance and even falls. But by focusing on where their body is at the moment, they can prevent these problems.  Practicing being aware of the body in different situations, like walking to the car or turning in bed, can help break old habits and build new, healthier ones.  Using Breathing to Calm the Body:  Deep breathing, especially focusing on a long exhale, can help calm the body. This is because it activates a part of the nervous system that helps the body relax and heal.  It's important not to overthink breathing, as it can make anxiety worse. Instead, try to notice how the body relaxes with each breath, which can help reduce muscle tension and stress.  The Positive Side of the Mind-Body Connection:  When people learn to control their body, it can make them feel happier and more grateful. This positive feeling can create a cycle of better health and more control over the body.  With practice, these positive changes can actually change how the brain works, leading to even more improvement over time.    This handout explains how the mind and body are connected, especially for people with Parkinson's disease. By becoming more aware of the body and using simple techniques like deep breathing, it's possible to regain some control and improve overall health.    Diaphragmatic breathing, also known as deep breathing or abdominal breathing, involves fully engaging the diaphragm while breathing deeply into  the abdomen rather than shallow chest breathing. This type of breathing has numerous physiological benefits, particularly in its effect on the parasympathetic nervous system (PNS), which is responsible for the body's rest and digest functions. Here's how diaphragmatic breathing can benefit the body and activate the PNS:   1. Activation of the Parasympathetic Nervous System (PNS):   The PNS is responsible for slowing the heart rate, lowering blood pressure, and promoting relaxation and recovery. Diaphragmatic breathing helps activate this system by stimulating the vagus nerve, a key component of the parasympathetic response.   The vagus nerve runs from the brainstem to the abdomen and is involved in controlling heart rate and digestion. When you breathe deeply, it sends signals to the brain that promote a state of calmness and relaxation.   By engaging  the diaphragm, which is a large muscle that moves downward during deep breathing, the body is encouraged to relax rather than stay in a heightened, fight-or-flight state (sympathetic nervous system activation).   2. Reduced Heart Rate and Blood Pressure:   Diaphragmatic breathing has been shown to help lower heart rate and blood pressure, both of which are elevated during periods of stress or anxiety. By stimulating the PNS, the body naturally enters a state of lower arousal, which helps to reduce these physiological markers.   Studies have found that deep breathing can also lead to increased heart rate variability (HRV), which is a marker of autonomic nervous system health. Higher HRV is associated with greater flexibility in responding to stress and a healthier PNS.   3. Improved Oxygen Exchange:   Deep breathing allows the lungs to fill more fully with air, which enhances oxygen exchange and the elimination of carbon dioxide. This increased efficiency in gas exchange helps improve overall oxygen delivery to tissues, supports cellular energy production, and  promotes better function of organs.   Diaphragmatic breathing also helps ensure that the lower lobes of the lungs--which are underused during shallow chest breathing--are fully engaged, improving lung capacity and efficiency.   4. Reduced Stress and Anxiety:   When you breathe deeply, your body shifts from the stress-driven sympathetic state to a more relaxed parasympathetic state. This shift can reduce the production of stress hormones such as cortisol and adrenaline, leading to a greater sense of calm.   This effect can be especially beneficial in managing chronic stress, anxiety, and even post-traumatic stress disorder (PTSD). Mindfulness-based breathing exercises that include diaphragmatic breathing are often used in therapy to help regulate emotional responses.   5. Improved Digestive Function:   The PNS plays a key role in promoting digestion. When diaphragmatic breathing is practiced, it helps stimulate the digestive organs and encourages peristalsis (the wave-like motion of muscles in the digestive tract). This results in better digestion and a reduction in symptoms like bloating and constipation.   The increase in blood flow to the abdominal organs due to deep breathing helps improve nutrient absorption and overall gastrointestinal health.   6. Muscle Relaxation:   Diaphragmatic breathing promotes muscle relaxation. As the body enters a parasympathetic state, muscle tension tends to decrease, which can relieve chronic muscle pain, headaches, or neck stiffness caused by stress.   The act of deep breathing itself can create a soothing effect on the body and mind, aiding in the relaxation of both skeletal and smooth muscles.   7. Enhancing Mental Clarity and Focus:   Since diaphragmatic breathing activates the PNS, it also helps the brain shift from a state of agitation (sympathetic activation) to a more calm, focused state. This can improve cognitive performance, decision-making, and mental clarity.    Mindfulness and meditation practices often incorporate diaphragmatic breathing as it supports a state of alert relaxation, which is conducive to mental clarity and focus.   Summary of Benefits:   Activates the parasympathetic nervous system, inducing a relaxation response.   Reduces heart rate and blood pressure, improving cardiovascular health.   Increases oxygen exchange and promotes better lung function.   Relieves stress and anxiety, helping to regulate emotional responses.   Improves digestive function and reduces symptoms like bloating and constipation.   Promotes muscle relaxation, reducing tension and discomfort.   Enhances focus and mental clarity, improving cognitive functions.   Overall, diaphragmatic breathing is a simple yet powerful technique that supports both mental and physical  well-being by promoting parasympathetic activation and a state of relaxation and recovery.     The present moment refers to the here and now -- the ongoing instant that is happening right now, without being clouded by past memories or future anticipations. It's the only time we truly have control over, yet it's often the most overlooked. The past is gone, and the future hasn't arrived, so the present moment is where we live, act, and experience life.   Living in the present is associated with mindfulness, which means paying full attention to what's happening, without judgment or distraction. Being present allows us  to fully engage with our experiences, leading to reduced stress, greater enjoyment, and improved emotional well-being.   Why It's Important to Stay in the Present Moment:   Reduces Anxiety and Stress: Much of our anxiety comes from worrying about the future or ruminating about the past. Focusing on the present helps minimize these feelings.   Increases Happiness and Fulfillment: By savoring the current moment, we are more likely to feel content and appreciative of what we have right now.   Improves Relationships:  Being fully present with others fosters deeper connection and understanding.   Enhances Focus and Productivity: Concentrating on the task at hand, instead of multitasking or letting your mind wander, boosts efficiency.   Strategies to Stay in the Present Moment:   Mindful Breathing:   Focus on your breath to center yourself in the present. When you notice your mind wandering, gently bring it back to the sensation of your breath. The simple act of paying attention to your inhale and exhale can help anchor you in the present moment.   Body Scan:   A body scan involves mentally scanning your body from head to toe, paying attention to the physical sensations. This helps bring your awareness to the present and can help release tension.   Practice Gratitude:   Regularly practicing gratitude for small moments and simple things in your life can help you stay grounded in the present. This can include appreciating the food you're eating, the beauty of nature, or the people around you.      Engage Fully in Activities:   When you are engaged in an activity, immerse yourself completely in it. Whether it's reading, cooking, exercising, or even just walking, give it your full attention. Let go of distractions like phones or thoughts about the past or future.   Mindful Listening:   When interacting with others, focus entirely on listening to what they are saying, without preparing your response or judging them. This helps deepen your connection and keeps you in the present.   Limit Multitasking:   Multitasking can scatter your focus and pull you away from the present. Try to focus on one task at a time. Being fully involved in each task helps you maintain mindfulness.   Use Anchors:   You can set reminders throughout the day to bring yourself back to the present moment. This can be as simple as setting a timer to remind you to check in with your breath, or noticing the sensation of your feet on the ground as a physical reminder to  stay grounded.   Meditation:   Regular meditation can train your mind to stay in the present moment. Simple practices like focusing on your breath or using guided meditations help calm your mind and foster mindfulness.   Savor the Moment:   Instead of rushing through experiences, pause and appreciate the details. Whether it's enjoying  a meal, a conversation, or a beautiful sunset, take time to fully experience it.   Accept the Present:   Sometimes we try to avoid or escape discomfort, but practicing acceptance of whatever is happening in the moment can help you stay grounded. This doesn't mean you have to like everything, but embracing it as part of your experience can help you remain present.   Overcoming Common Challenges:   Distracting Thoughts: It's natural for your mind to wander. When this happens, gently guide your focus back to the present. Practice self-compassion, as the goal isn't to eliminate distractions but to return to the present each time.   Emotional Overwhelm: If you feel overwhelmed by your emotions, recognize them without judgment. Try to experience them fully without letting them control you. Mindfulness-based strategies like grounding exercises can help.   External Distractions: Set boundaries with your environment. If possible, create spaces free of distractions when you need to focus or be mindful.   Staying in the present is a skill that takes practice. The more you practice mindfulness and self-awareness, the easier it becomes to live in the now.      The Correlation Between Posture and Balance      Postural alignment and a person's balance are directly related from a weight distribution stand point.  When standing, if someone were to use a piece of chalk to outline the borders of one's feet, there would be a box on the floor.  Centering the body over top of the box keeps a person balanced and in control.  We have about 2 inches of wiggle room to shift our weight around the borders of the  box and still stay balanced.  While standing, notice the pressure of the feet on the ground.  That is the target.     When a person is standing tall, the shoulders are stacked over the hips and their weight is distributed over their feet, within the borders of that box.  To take a step, all of the weight is shifted over one leg to advance the other.  If someone was taking photos as a person walked, at one point in time only one leg would be on the ground.  Now the person's weight is stacked over a smaller box.     When someone has difficulty with balance, the above weight shifting principles are compromised.  The two main areas for a person are the shoulders and hips, due to the amount of weight associated with those areas.  If a person develops the habit of leaning forward when standing, their balance point becomes compromised.  The shoulders and hips counter-balance each other and are the areas to be aware of.  If a person is falling forward, the shoulders typically need to come back.  If the person is falling backward, typically the hips need to come forward to center back over the box (feet).     Our bodies have three balance centers: our eyes, our inner ear which tells our brain where our head is in space and knowing where our bodies are in space.  Most people rely on vision for balance.  Using the sense of where your weight is distributed over your feet, knowing where your body is in space by feeling it are keys to rediscovering control over one's balance.     Recognizing movement and postural habits that have compromised one's balance, then redirecting themselves to new habits through repetition plays a major role in restoring balance  control.            BERG:   1. Sitting to standing   Instruction: Please stand up. Try not to use your hands for support.   X_ (4) Able to stand, no hands and stabilized independently.   _ (3) Able to stand independently using hands.   _ (2) Able to stand using hands after  several tries.   _ (1) Needs minimal assist to stand or stabilize.   _ (0) Needs moderate or maximum assist to stand.     2. Stand unsupported   Instruction: Stand for two minutes without holding.   X_ (4) Able to stand safely 2 minutes.   _ (3) Able to stand 2 minutes with supervision.   _ (2) Able to stand 30 seconds unsupported.   _ (1) Needs several tries to stand 30 seconds unsupported.   _ (0) Unable to stand 30 seconds unassisted.     If subject is able to stand 2 minutes safely, score full marks for sitting unsupported and proceed to item #4.     3. Sitting unsupported feet on floor   Instruction: Sit with arms folded for 2 minutes.   X_ (4) Able to sit safely and securely for 2 minutes.   _ (3) Able to sit 2 minutes under supervision.   _ (2) Able to sit 30 seconds.   _ (1) Able to sit 10 seconds.   _ (0) Unable to sit without support 10 seconds.   4. Standing to sitting   Instructions: Please sit down.   x_ (4) Sits safely with minimal use of hands.   _ (3) Controls descent by using hands.   _ (2) Uses back of legs against chair to control descent.   _ (1) Sits independently but has uncontrolled descent.   _ (0) Needs assistance to sit.     5. Transfers   Instruction: Please move from chair to bed and back. (One way towards a seat with armrests and one way towards a seat without armrests.)   X_ (4) Able to transfer safely with only minor use of hands.   _ (3) Able to transfer safely with definite need of hands.   _ (2) Able to transfer with verbal cueing and/or supervision.   _ (1) Needs one person to assist.   _ (0) Needs two people to assist or supervise to be safe.     6. Standing unsupported with eyes closed   Instruction: Close your eyes and stand still for 10 seconds.   _ (4) Able to stand 10 seconds safely.   X_ (3) Able to stand 10 seconds with supervision.   _ (2) Able to stand 3 seconds.   _ (1) Unable to keep eyes closed 3 seconds but stays steady.   _ (0) Needs help to keep from falling.     7.  Standing unsupported with feet together   Instruction: Place your feet together and stand without holding.   _ (4) Able to place feet together independently and stand 1 minute safely.   X_ (3) Able to place feet together independently and stand 1 minute with supervision.   _ (2) Able to place feet together independently but unable to hold for 30 seconds.   _ (1) Needs help to attain position but able to stand 15 seconds feet together.   _ (0) Needs help to attain position and unable to hold for 15 seconds.     The following items are  to be performed while standing unsupported.     8. Reaching forward with an outstretched arm   Instructions: Lift arm to 90 degrees. Stretch out your fingers and reach forward as far as you can. (Examiner places a ruler at end of fingertips when arm is at 90 degrees. Fingers should not touch the ruler while reaching forward. The recorded measure is the distance forward that the fingers reach while the subject is in the most forward lean position.)   _ (4) Can reach forward confidently >10 away.   _ (3) Can reach forward >5 safely.   X_ (2) Can reach forward >2 safely.   _ (1) Reaches forward but needs supervision.   _ (0) Needs help to keep from falling.     9. Pick up object from the floor   Instruction: Pick up the shoe which is placed in front of your feet.   _ (4) Able to pick up shoe safely and easily.   X_ (3) Able to pick up shoe but needs supervision.   _ (2) Unable to pick up but reaches 1-2 from shoe and keeps balance independently   _ (1) Unable to pick up and needs supervision while trying.   _ (0) Unable to try/needs assist to keep from falling.     10. Turning to look behind/over both shoulders   Instruction: Turn to look behind you over left shoulder. Repeat to the right.   _ (4) Looks behind from both sides and weight shifts well.   _ (3) Looks behind from one side only; other side shows less weight shift.   X_ (2) Turns sideways only but maintains balance   _ (1)  Needs supervision when turning.   _ (0) Needs assist to keep from falling.     11. Turn 360 degrees   Instruction: Turn completely around in a full circle. Pause. Then turn in the other direction.   _ (4) Able to turn 360 degrees safely in <4 seconds each side.   _ (3) Able to turn 360 degrees safely one side only in <4 seconds each side.   X_ (2) Able to turn 360 degrees safely but slowly.   _ (1) Needs close supervision or verbal cueing.   _ (0) Needs assist while turning.     Dynamic weight shifting while standing unsupported.     12. Stool touch   Instruction: Place each foot alternately on the stool. Continue until each foot has touched the stool 4 times.   _ (4) Able to stand independently and safely completes 8 steps in 20 seconds.   _ (3) Able to stand independently and safely completes 8 steps in >20 seconds.   X_ (2) Able to complete 4 steps without aid with supervision.   _ (1) Able to complete >2 steps needing minimal assist.   _ (0) Needs assistance to keep from falling/unable to try.     13. Standing unsupported, one foot in front   Instruction: Place one foot directly in front of the other. If you feel you can't place your foot directly in front, try to step far enough ahead that the heel of your forward foot is ahead of the toes of the other foot.   _ (4) Able to place foot tandem independently and hold 30 seconds.   _ (3) Able to place foot ahead of other independently and hold 30 seconds.   _ (2) Able to take small step independently and hold 30 seconds.  X_ (1) Needs help to step but can hold 15 seconds.   _ (0) Loses balance while stepping or standing.     14. Standing on one leg   Instruction: Stand on one leg as long as you can independently.   _ (4) Able to lift leg independently and hold >10 seconds.   _ (3) Able to lift leg independently and hold 5-10 seconds.   _ (2) Able to lift leg independently and hold = or > 3 seconds.   X_ (1) Tries to lift leg; unable to hold 3 seconds but remains  standing independently.   _ (0) Unable to try or needs assist to prevent fall.  Total Score: _39 /56    5 times sit to stand test:  18.59_ seconds with arms crossed   Level of assistance:  none   Quality of movement:  adequate anterior weight translation and eccentric control, pause between repetitions.        Timed up and Go:    Trial Time   Trial 1 25.14 Seconds   Trial 2 -- Seconds   Trial 3 -- Seconds       Average time: _25.14 seconds    10 meter walk test:  12.27_ seconds   Device used:  walking stick  Deviations noted:  significant forward head with cervical flexion and downward gaze, upper thoracic kyphotic posturing with elevated scapulas, steppage pattern with decreased step length, Right LE externally rotated with foot flat at initial contact B.  Freezing episodes:  none   Arm swing:  walking stick advanced but no reciprocal arm swing.      HEP:    Access Code: [email or web address]  URL: [email or web address]  Date: 05/24/2024  Prepared by: Sidra Sink    Exercises  - Supine Chin Tuck  - 1 x daily - 7 x weekly - 3 sets - 10 reps  - Seated Cervical Retraction  - 1 x daily - 7 x weekly - 3 sets - 10 reps  - Seated Cervical Retraction and Rotation  - 1 x daily - 7 x weekly - 2-3 sets - 10 reps  - Seated Scapular Retraction  - 1 x daily - 7 x weekly - 2-3 sets - 10 reps  - Doorway Pec Stretch at 60 Elevation  - 1 x daily - 7 x weekly - 1-2 sets - 10 reps - 10 hold  - Shoulder External Rotation and Scapular Retraction  - 1 x daily - 7 x weekly - 2-3 sets - 10 reps  - Standing Anatomical Position with Scapular Retraction and Depression at Wall  - 1 x daily - 7 x weekly - 2-3 sets - 10 reps  - Supine Diaphragmatic Breathing  - 1-3 x daily - 7 x weekly - 3 sets - 10 reps  - Supine Transversus Abdominis Bracing - Hands on Stomach  - 1 x daily - 7 x weekly - 2-3 sets - 10 reps - 5 hold  - Seated Diaphragmatic Breathing  - 1-3 x daily - 7 x weekly - 3 sets - 10 reps  - Seated Transversus Abdominis Bracing  - 1 x daily -  7 x weekly - 2-3 sets - 10 reps - 5 hold  - Standing Transverse Abdominis Contraction  - 1 x daily - 7 x weekly - 2-3 sets - 10 reps - 5 hold  - Standing Diaphragmatic Breathing  - 1-3 x daily - 7 x weekly - 3 sets - 10 reps  - Standing  Gluteal Sets  - 1 x daily - 7 x weekly - 2-3 sets - 10 reps - 5 hold      Assessment: Johnte's habitual pattern of flexed posturing is a significant component to his balance instability.  His center of mass is shifted anterior to his base of support and he currently uses his walking stick to compensate for that aspect.  The home program provided was designed to address those deficits through somatosensory input for functional carryover.  He was encouraged to review the HEP with his physical therapist for additional feedback on technique for proper carryover.      TEST SCORE   BERG balance 39/56   Timed up and Go 25.14 seconds   5x sit to stand 18.59 seconds   10 meter walk test 12.27 seconds        Recommendations:  Perform 150 minutes of exercise per week and incorporate somatosensory awareness into daily routines.      PLAN: Plan to follow up in 4 months. Please do not hesitate to contact me with any questions or concerns.

## 2024-05-24 NOTE — Patient Instructions (Addendum)
 Can increase melatonin 15 mg nightly to help with dreams and talking in sleep. Can also try extended release version of melatonin.     Check blood pressure at home to see if this is low        Treating Low Blood Pressure (Hypotension)  ?Changes to your diet - For example, eating more salt might help some people.    ? Drinking more water and staying hydrated. Drink water with meals and before/after exercising. You can also try keeping bottles of water next to your bed, and drink one before getting up in the morning.    ?Lifestyle changes - These can include things like changing how you sit, learning to stand up slowly, or avoiding hot and humid weather.    ?Wearing compression stockings with or without an abdominal binder - These devices apply pressure to the body and can help with certain types of hypotension. Compression stockings are worn on the legs. The ones that go to your waist are the most helpful, but they can be hard to use. Abdominal binders are worn around the belly.    ?Medicines to treat hypotension directly - If the other treatment options do not help, your doctor can prescribe a medicine to help with your symptoms. You might need to try more than 1 medicine.  In some cases, your doctor or nurse might ask you to track your blood pressure at home and keeping a blood pressure diary.   Is there anything I can do on my own to feel better?  Yes. There are a few things that you can do to reduce the problems caused by low blood pressure. They are listed below. But you should try these things only after talking to your doctor or nurse.  ?Stand up slowly and give your body time to adapt. This is especially important when you get out of bed in the morning. Start by sitting up and waiting a moment. Then, swing your legs over the side of the bed and wait some more. When you do stand up, make sure you have something to hold onto in case you start to feel dizzy.    ?If you have symptoms after eating, it might help  to make some changes to your eating habits. For example, you can try to avoid large meals, eat meals that are low in carbohydrates, and drink water with your food. Stand up slowly when you get up from the table.    ?Limit activities that could make you overheat or sweat a lot. Examples include taking hot showers, running, or hiking. These things can make hypotension worse.    ?Make sure that you drink enough fluids, especially in hot weather.    ?Use extra pillows or an adjustable bed to make the head of your bed higher. This will raise your head above your heart slightly.    ?Avoid drinking a lot of alcohol.    What problems should I watch for?  Low blood pressure can lead to falls and injuries. People with hypotension might also be at a higher risk for other health problems, including heart problems.  Call for emergency help right away (call 9-1-1) if you have signs of a heart attack. These might include:  ?Chest pain, pressure, or discomfort  ?Breathing trouble, sweating, upset stomach, or cold and clammy skin  ?Pain in your arms, back, or jaw  ?Pain that gets worse with activity, like walking up stairs    Provided  by UpToDate  Therapy and counseling services available in the [place] area:    [place]  [facility] locations:  [facility]  [address], [place], [place] [phone]  [facility]  [address]  [place], [place] [phone]  [facility]  [address], [place], [place] [phone]  [facility]  [address] [place], [place] [address] [phone]    [facility] area  [facility]  [address], [place], [place]   [phone]    [facility]  [address], [place], [place]   [phone]  [facility]  [address] [place], [place], [place] [phone]    [facility]  [address], [place], [place] [phone]    [facility]  [address], [place], [place] [phone]    [facility]  [address], [place], [place] [phone]    [facility]  [employer]  [phone]    [place]  [address] [place], [place], [place] [address]  [phone]    Jerilynn Flatter, psychologist  [address], [place], [place] [address]  [phone]    [place] [place]  [address] [place], [place] [address]  [phone]    [place]  [address] [place], [place] [address]  [phone]    [place] area  [facility]  Individual, Marital, Family and Group Therapy  Psychological Assessments - Consultations  April Clendenin/T[facility]  [address], [place], [place]  [phone]  Fax [phone]

## 2024-05-31 ENCOUNTER — Encounter (INDEPENDENT_AMBULATORY_CARE_PROVIDER_SITE_OTHER): Payer: Self-pay

## 2024-06-01 ENCOUNTER — Encounter (INDEPENDENT_AMBULATORY_CARE_PROVIDER_SITE_OTHER): Payer: Self-pay

## 2024-06-01 ENCOUNTER — Ambulatory Visit (INDEPENDENT_AMBULATORY_CARE_PROVIDER_SITE_OTHER): Payer: Self-pay

## 2024-06-01 NOTE — Telephone Encounter (Signed)
 Patient has been placed on the therapy wait list. Patient was made aware of the wait list process via [facility] message.  Sim Choquette Salamone, CASE MANAGER  06/01/2024, 10:13

## 2024-07-03 ENCOUNTER — Telehealth (HOSPITAL_BASED_OUTPATIENT_CLINIC_OR_DEPARTMENT_OTHER): Payer: Self-pay

## 2024-07-03 NOTE — Telephone Encounter (Signed)
 Pt's wife called regarding a notification the pt received on his My Chart, that told them to have something done by Oct 1st. She also mentioned the pt is scheduled for a US  in [place]. I asked her if this notification was for labs. She wasn't sure, but said it looked like it. I told her there are some labs ordered by Dr. Reinaldo from August of 2024, and maybe this is what the message was about. I told her there are new orders from [facility], APRN/NP-C, from May 2025 that include the same orders plus other labs. I told her they could go earlier to his US  appointment and have the blood sample collected on the same day, since they live far from the facility. I told her to let us  know if there's anything else to the notification he received. She verbally expressed understanding and had no other questions at this time. Myelle Poteat Da Nikki Doing, LPN

## 2024-07-09 ENCOUNTER — Other Ambulatory Visit: Payer: Self-pay

## 2024-07-10 ENCOUNTER — Other Ambulatory Visit (HOSPITAL_COMMUNITY)

## 2024-07-10 ENCOUNTER — Ambulatory Visit (INDEPENDENT_AMBULATORY_CARE_PROVIDER_SITE_OTHER)

## 2024-07-10 ENCOUNTER — Ambulatory Visit (HOSPITAL_BASED_OUTPATIENT_CLINIC_OR_DEPARTMENT_OTHER): Payer: Self-pay | Admitting: Student in an Organized Health Care Education/Training Program

## 2024-07-10 ENCOUNTER — Other Ambulatory Visit (HOSPITAL_BASED_OUTPATIENT_CLINIC_OR_DEPARTMENT_OTHER): Payer: Self-pay | Admitting: Student in an Organized Health Care Education/Training Program

## 2024-07-10 ENCOUNTER — Ambulatory Visit (INDEPENDENT_AMBULATORY_CARE_PROVIDER_SITE_OTHER)
Admission: RE | Admit: 2024-07-10 | Discharge: 2024-07-10 | Discharge status: Home / Self Care | Payer: Self-pay | Source: Ambulatory Visit

## 2024-07-10 ENCOUNTER — Other Ambulatory Visit (HOSPITAL_COMMUNITY): Admitting: Student in an Organized Health Care Education/Training Program

## 2024-07-10 DIAGNOSIS — K7460 Unspecified cirrhosis of liver: Secondary | ICD-10-CM

## 2024-07-10 DIAGNOSIS — K7469 Other cirrhosis of liver: Secondary | ICD-10-CM

## 2024-07-10 LAB — CBC WITH DIFF
BASOPHIL #: <0.1 x10ˆ3/uL (ref <=0.20)
BASOPHIL %: 0.6 %
EOSINOPHIL #: 0.18 x10ˆ3/uL (ref <=0.50)
EOSINOPHIL %: 5.5 %
HCT: 38.8 % — ABNORMAL LOW (ref 38.9–52.0)
HGB: 13.6 g/dL (ref 13.4–17.5)
IMMATURE GRANULOCYTE #: <0.1 x10ˆ3/uL (ref <0.10)
IMMATURE GRANULOCYTE %: 0.3 % (ref 0.0–1.0)
LYMPHOCYTE #: 0.59 x10ˆ3/uL — ABNORMAL LOW (ref 1.00–4.80)
LYMPHOCYTE %: 17.9 %
MCH: 35.4 pg — ABNORMAL HIGH (ref 26.0–32.0)
MCHC: 35.1 g/dL (ref 31.0–35.5)
MCV: 101 fL — ABNORMAL HIGH (ref 78.0–100.0)
MONOCYTE #: 0.35 x10ˆ3/uL (ref 0.20–1.10)
MONOCYTE %: 10.6 %
MPV: 12.8 fL — ABNORMAL HIGH (ref 8.7–12.5)
NEUTROPHIL #: 2.14 x10ˆ3/uL (ref 1.50–7.70)
NEUTROPHIL %: 65.1 %
PLATELETS: 55 x10ˆ3/uL — ABNORMAL LOW (ref 150–400)
RBC: 3.84 x10ˆ6/uL — ABNORMAL LOW (ref 4.50–6.10)
RDW-CV: 13.7 % (ref 11.5–15.5)
WBC: 3.3 x10ˆ3/uL — ABNORMAL LOW (ref 3.7–11.0)

## 2024-07-10 LAB — COMPREHENSIVE METABOLIC PANEL, NON-FASTING
ALBUMIN: 3 g/dL — ABNORMAL LOW (ref 3.4–4.8)
ALKALINE PHOSPHATASE: 65 U/L (ref 45–115)
ALT (SGPT): 7 U/L — ABNORMAL LOW (ref 10–55)
ANION GAP: 7 mmol/L (ref 4–13)
AST (SGOT): 51 U/L — ABNORMAL HIGH (ref 8–45)
BILIRUBIN TOTAL: 2.3 mg/dL — ABNORMAL HIGH (ref 0.3–1.3)
BUN/CREA RATIO: 15 (ref 6–22)
BUN: 9 mg/dL (ref 8–25)
CALCIUM: 8.2 mg/dL — ABNORMAL LOW (ref 8.6–10.3)
CHLORIDE: 107 mmol/L (ref 96–111)
CO2 TOTAL: 23 mmol/L (ref 23–31)
CREATININE: 0.61 mg/dL — ABNORMAL LOW (ref 0.75–1.35)
GLUCOSE: 129 mg/dL — ABNORMAL HIGH (ref 65–125)
POTASSIUM: 4.2 mmol/L (ref 3.5–5.1)
PROTEIN TOTAL: 6.6 g/dL (ref 6.0–8.0)
SODIUM: 137 mmol/L (ref 136–145)
eGFRcr - MALE: >90 mL/min/1.73mˆ2 (ref >=60)

## 2024-07-10 LAB — VITAMIN D 25 TOTAL: VITAMIN D 25, TOTAL: 26.2 ng/mL (ref 20.0–100.0)

## 2024-07-10 LAB — ALPHA FETOPROTEIN (AFP) TUMOR MARKER: AFP TUMOR MARKER: 3 ng/mL (ref <=9)

## 2024-07-10 LAB — PT/INR: INR: 1.3 — ABNORMAL HIGH (ref 0.80–1.10)

## 2024-07-12 ENCOUNTER — Ambulatory Visit (HOSPITAL_BASED_OUTPATIENT_CLINIC_OR_DEPARTMENT_OTHER): Payer: Self-pay

## 2024-07-12 ENCOUNTER — Encounter (INDEPENDENT_AMBULATORY_CARE_PROVIDER_SITE_OTHER): Payer: Self-pay | Admitting: Clinical Neuropsychologist

## 2024-07-24 ENCOUNTER — Other Ambulatory Visit (HOSPITAL_BASED_OUTPATIENT_CLINIC_OR_DEPARTMENT_OTHER): Payer: Self-pay | Admitting: Student in an Organized Health Care Education/Training Program

## 2024-07-24 DIAGNOSIS — I8500 Esophageal varices without bleeding: Secondary | ICD-10-CM

## 2024-07-26 ENCOUNTER — Ambulatory Visit (HOSPITAL_COMMUNITY): Payer: Self-pay | Admitting: Radiology

## 2024-08-01 ENCOUNTER — Ambulatory Visit (INDEPENDENT_AMBULATORY_CARE_PROVIDER_SITE_OTHER): Payer: Self-pay

## 2024-08-31 ENCOUNTER — Other Ambulatory Visit (HOSPITAL_COMMUNITY): Payer: Self-pay | Admitting: FAMILY PRACTICE

## 2024-08-31 ENCOUNTER — Ambulatory Visit
Admission: RE | Admit: 2024-08-31 | Discharge: 2024-08-31 | Disposition: A | Discharge status: Home / Self Care | Source: Ambulatory Visit | Attending: FAMILY PRACTICE | Admitting: FAMILY PRACTICE

## 2024-08-31 DIAGNOSIS — I10 Essential (primary) hypertension: Secondary | ICD-10-CM

## 2024-08-31 LAB — CBC WITH DIFF
BASOPHIL #: <0.1 x10ˆ3/uL (ref <=0.20)
BASOPHIL %: 0.9 %
EOSINOPHIL #: 0.19 x10ˆ3/uL (ref <=0.50)
EOSINOPHIL %: 5.4 %
HCT: 38.3 % — ABNORMAL LOW (ref 38.9–52.0)
HGB: 13.1 g/dL — ABNORMAL LOW (ref 13.4–17.5)
IMMATURE GRANULOCYTE #: <0.1 x10ˆ3/uL (ref <0.10)
IMMATURE GRANULOCYTE %: 0 % (ref 0.0–1.0)
LYMPHOCYTE #: 0.7 x10ˆ3/uL — ABNORMAL LOW (ref 1.00–4.80)
LYMPHOCYTE %: 19.9 %
MCH: 34.3 pg — ABNORMAL HIGH (ref 26.0–32.0)
MCHC: 34.2 g/dL (ref 31.0–35.5)
MCV: 100.3 fL — ABNORMAL HIGH (ref 78.0–100.0)
MONOCYTE #: 0.41 x10ˆ3/uL (ref 0.20–1.10)
MONOCYTE %: 11.6 %
MPV: 12.2 fL (ref 8.7–12.5)
NEUTROPHIL #: 2.19 x10ˆ3/uL (ref 1.50–7.70)
NEUTROPHIL %: 62.2 %
PLATELETS: 77 x10ˆ3/uL — ABNORMAL LOW (ref 150–400)
RBC: 3.82 x10ˆ6/uL — ABNORMAL LOW (ref 4.50–6.10)
RDW-CV: 13.8 % (ref 11.5–15.5)
WBC: 3.5 x10ˆ3/uL — ABNORMAL LOW (ref 3.7–11.0)

## 2024-08-31 LAB — CBC/DIFF - CLIENT CONSOLIDATED
BASOPHIL #: <0.1 x10ˆ3/uL (ref <=0.20)
BASOPHIL %: 0.9 %
EOSINOPHIL #: 0.19 x10ˆ3/uL (ref <=0.50)
EOSINOPHIL %: 5.4 %
HCT: 38.3 % — ABNORMAL LOW (ref 38.9–52.0)
HGB: 13.1 g/dL — ABNORMAL LOW (ref 13.4–17.5)
IMMATURE GRANULOCYTE #: <0.1 x10ˆ3/uL (ref <0.10)
IMMATURE GRANULOCYTE %: 0 % (ref 0.0–1.0)
LYMPHOCYTE #: 0.7 x10ˆ3/uL — ABNORMAL LOW (ref 1.00–4.80)
LYMPHOCYTE %: 19.9 %
MCH: 34.3 pg — ABNORMAL HIGH (ref 26.0–32.0)
MCHC: 34.2 g/dL (ref 31.0–35.5)
MCV: 100.3 fL — ABNORMAL HIGH (ref 78.0–100.0)
MONOCYTE #: 0.41 x10ˆ3/uL (ref 0.20–1.10)
MONOCYTE %: 11.6 %
MPV: 12.2 fL (ref 8.7–12.5)
NEUTROPHIL #: 2.19 x10ˆ3/uL (ref 1.50–7.70)
NEUTROPHIL %: 62.2 %
PLATELETS: 77 x10ˆ3/uL — ABNORMAL LOW (ref 150–400)
RBC: 3.82 x10ˆ6/uL — ABNORMAL LOW (ref 4.50–6.10)
RDW-CV: 13.8 % (ref 11.5–15.5)
WBC: 3.5 x10ˆ3/uL — ABNORMAL LOW (ref 3.7–11.0)

## 2024-08-31 LAB — LIPID PANEL
CHOL/HDL RATIO: 2.8
CHOLESTEROL: 137 mg/dL (ref 100–200)
HDL CHOL: 49 mg/dL — ABNORMAL LOW (ref >=50)
LDL CALC: 75 mg/dL (ref <100)
NON-HDL: 88 mg/dL (ref <=190)
TRIGLYCERIDES: 63 mg/dL (ref <150)
VLDL CALC: 10 mg/dL (ref <30)

## 2024-08-31 LAB — COMPREHENSIVE METABOLIC PANEL, NON-FASTING
ALBUMIN: 3.3 g/dL — ABNORMAL LOW (ref 3.4–4.8)
ALKALINE PHOSPHATASE: 61 U/L (ref 45–115)
ALT (SGPT): 13 U/L (ref <43)
ANION GAP: 7 mmol/L (ref 4–13)
AST (SGOT): 55 U/L — ABNORMAL HIGH (ref 11–34)
BILIRUBIN TOTAL: 2.5 mg/dL — ABNORMAL HIGH (ref 0.3–1.3)
BUN/CREA RATIO: 21 (ref 6–22)
BUN: 12 mg/dL (ref 8–25)
CALCIUM: 8.6 mg/dL (ref 8.6–10.3)
CHLORIDE: 108 mmol/L (ref 96–111)
CO2 TOTAL: 21 mmol/L — ABNORMAL LOW (ref 23–31)
CREATININE: 0.56 mg/dL — ABNORMAL LOW (ref 0.75–1.35)
GLUCOSE: 114 mg/dL (ref 65–125)
POTASSIUM: 4.1 mmol/L (ref 3.5–5.1)
PROTEIN TOTAL: 6.2 g/dL (ref 5.6–7.6)
SODIUM: 136 mmol/L (ref 136–145)
eGFRcr - MALE: >90 mL/min/1.73mˆ2 (ref >=60)

## 2024-09-27 ENCOUNTER — Ambulatory Visit (INDEPENDENT_AMBULATORY_CARE_PROVIDER_SITE_OTHER): Payer: Self-pay | Admitting: Rehabilitative and Restorative Service Providers"

## 2024-09-27 ENCOUNTER — Other Ambulatory Visit (HOSPITAL_BASED_OUTPATIENT_CLINIC_OR_DEPARTMENT_OTHER): Payer: Self-pay

## 2024-09-27 ENCOUNTER — Ambulatory Visit (INDEPENDENT_AMBULATORY_CARE_PROVIDER_SITE_OTHER): Payer: Self-pay | Admitting: NURSE PRACTITIONER

## 2024-09-27 DIAGNOSIS — K7682 Hepatic encephalopathy: Secondary | ICD-10-CM

## 2024-10-25 ENCOUNTER — Other Ambulatory Visit: Payer: Self-pay

## 2024-11-01 ENCOUNTER — Other Ambulatory Visit (HOSPITAL_BASED_OUTPATIENT_CLINIC_OR_DEPARTMENT_OTHER): Payer: Self-pay | Admitting: Student in an Organized Health Care Education/Training Program

## 2024-11-01 DIAGNOSIS — K7682 Hepatic encephalopathy: Secondary | ICD-10-CM
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
## 2024-11-02 ENCOUNTER — Ambulatory Visit: Attending: NURSE PRACTITIONER | Admitting: NURSE PRACTITIONER

## 2024-11-02 ENCOUNTER — Ambulatory Visit (INDEPENDENT_AMBULATORY_CARE_PROVIDER_SITE_OTHER): Admitting: Rehabilitative and Restorative Service Providers"

## 2024-11-02 ENCOUNTER — Other Ambulatory Visit: Payer: Self-pay

## 2024-11-02 VITALS — HR 72 | Temp 97.4°F | Wt 212.8 lb

## 2024-11-02 DIAGNOSIS — G20B2 Parkinson's disease with dyskinesia, with fluctuations: Secondary | ICD-10-CM

## 2024-11-02 DIAGNOSIS — R498 Other voice and resonance disorders: Secondary | ICD-10-CM | POA: Insufficient documentation

## 2024-11-02 DIAGNOSIS — R1310 Dysphagia, unspecified: Secondary | ICD-10-CM | POA: Insufficient documentation

## 2024-11-02 MED ORDER — TRAZODONE 50 MG TABLET: 50.0000 mg | ORAL_TABLET | Freq: Every evening | ORAL | 0 refills | Status: AC

## 2024-11-02 NOTE — Patient Instructions (Signed)
 Stop melatonin    Start Trazodone  25 mg (half a tablet) for 1 week. Can increase to 50 mg (whole tablet) the next week. Helpful for sleep and mood.

## 2024-11-02 NOTE — Progress Notes (Signed)
 [employer] [facility]  Physical Therapy Evaluation    Patient Name: Jacob Santiago  Date of Birth: [birth year]      HPI:       Jacob Santiago is a 69 y.o. male being seen for an evaluation with a diagnosis of Parkinson's disease.      Subjective:  A lot of things going on with the family.  When out in public, he has more trouble.  Feels that he needs his medication to work so that he can move and walk.  Routine - goes and fixes his breakfast and watches wildlife for an hour or two. Will then go and watch TV and do some standing exercise with is rollator.  Enjoys bookwork economist.      Objective:  Walking into the clinic, Jacob Santiago exhibited flexed posturing, shuffling gait pattern with forward flexion leaning into his rollator walker.  Postural awareness cues and recognizing his ability to alter how he walked when upright was discussed.  Jacob Santiago demonstrated excellent carryover, improving his posture and his step length to the treatment room.   Jacob Santiago was cued to recognize that he was falling into his rollator currently.  Cues to notice the pressure of his feet and stay tall over his forefoot.  Whichever leg stays on the ground, pay attention to that pressure as the target to walk with control.  He ambulated back to the clinic with improved cadence, step length and dynamic control.    Jacob Santiago:   1. Sitting to standing   Instruction: Please stand up. Try not to use your hands for support.   X_ (4) Able to stand, no hands and stabilized independently.   _ (3) Able to stand independently using hands.   _ (2) Able to stand using hands after several tries.   _ (1) Needs minimal assist to stand or stabilize.   _ (0) Needs moderate or maximum assist to stand.     2. Stand unsupported   Instruction: Stand for two minutes without holding.   X_ (4) Able to stand safely 2 minutes.   _ (3) Able to stand 2 minutes with supervision.   _ (2) Able to stand 30 seconds unsupported.   _ (1)  Needs several tries to stand 30 seconds unsupported.   _ (0) Unable to stand 30 seconds unassisted.     If subject is able to stand 2 minutes safely, score full marks for sitting unsupported and proceed to item #4.     3. Sitting unsupported feet on floor   Instruction: Sit with arms folded for 2 minutes.   X_ (4) Able to sit safely and securely for 2 minutes.   _ (3) Able to sit 2 minutes under supervision.   _ (2) Able to sit 30 seconds.   _ (1) Able to sit 10 seconds.   _ (0) Unable to sit without support 10 seconds.   4. Standing to sitting   Instructions: Please sit down.   X_ (4) Sits safely with minimal use of hands.   _ (3) Controls descent by using hands.   _ (2) Uses back of legs against chair to control descent.   _ (1) Sits independently but has uncontrolled descent.   _ (0) Needs assistance to sit.     5. Transfers   Instruction: Please move from chair to bed and back. (One way towards a seat with armrests and one way towards a seat without armrests.)   _ (4) Able to  transfer safely with only minor use of hands.   X_ (3) Able to transfer safely with definite need of hands.   _ (2) Able to transfer with verbal cueing and/or supervision.   _ (1) Needs one person to assist.   _ (0) Needs two people to assist or supervise to be safe.     6. Standing unsupported with eyes closed   Instruction: Close your eyes and stand still for 10 seconds.   X_ (4) Able to stand 10 seconds safely.   _ (3) Able to stand 10 seconds with supervision.   _ (2) Able to stand 3 seconds.   _ (1) Unable to keep eyes closed 3 seconds but stays steady.   _ (0) Needs help to keep from falling.     7. Standing unsupported with feet together   Instruction: Place your feet together and stand without holding.   _ (4) Able to place feet together independently and stand 1 minute safely.   X_ (3) Able to place feet together independently and stand 1 minute with supervision.   _ (2) Able to place feet together independently but unable to hold  for 30 seconds.   _ (1) Needs help to attain position but able to stand 15 seconds feet together.   _ (0) Needs help to attain position and unable to hold for 15 seconds.     The following items are to be performed while standing unsupported.     8. Reaching forward with an outstretched arm   Instructions: Lift arm to 90 degrees. Stretch out your fingers and reach forward as far as you can. (Examiner places a ruler at end of fingertips when arm is at 90 degrees. Fingers should not touch the ruler while reaching forward. The recorded measure is the distance forward that the fingers reach while the subject is in the most forward lean position.)   _ (4) Can reach forward confidently >10 away.   X_ (3) Can reach forward >5 safely.   _ (2) Can reach forward >2 safely.   _ (1) Reaches forward but needs supervision.   _ (0) Needs help to keep from falling.     9. Pick up object from the floor   Instruction: Pick up the shoe which is placed in front of your feet.   _ (4) Able to pick up shoe safely and easily.   X_ (3) Able to pick up shoe but needs supervision.   _ (2) Unable to pick up but reaches 1-2 from shoe and keeps balance independently   _ (1) Unable to pick up and needs supervision while trying.   _ (0) Unable to try/needs assist to keep from falling.     10. Turning to look behind/over both shoulders   Instruction: Turn to look behind you over left shoulder. Repeat to the right.   X_ (4) Looks behind from both sides and weight shifts well.   _ (3) Looks behind from one side only; other side shows less weight shift.   _ (2) Turns sideways only but maintains balance   _ (1) Needs supervision when turning.   _ (0) Needs assist to keep from falling.     11. Turn 360 degrees   Instruction: Turn completely around in a full circle. Pause. Then turn in the other direction.   _ (4) Able to turn 360 degrees safely in <4 seconds each side.   _ (3) Able to turn 360 degrees safely one side only in <  4 seconds each side.    X_ (2) Able to turn 360 degrees safely but slowly.   _ (1) Needs close supervision or verbal cueing.   _ (0) Needs assist while turning.     Dynamic weight shifting while standing unsupported.     12. Stool touch   Instruction: Place each foot alternately on the stool. Continue until each foot has touched the stool 4 times.   _ (4) Able to stand independently and safely completes 8 steps in 20 seconds.   _ (3) Able to stand independently and safely completes 8 steps in >20 seconds.   X_ (2) Able to complete 4 steps without aid with supervision.   _ (1) Able to complete >2 steps needing minimal assist.   _ (0) Needs assistance to keep from falling/unable to try.     13. Standing unsupported, one foot in front   Instruction: Place one foot directly in front of the other. If you feel you cant place your foot directly in front, try to step far enough ahead that the heel of your forward foot is ahead of the toes of the other foot.   _ (4) Able to place foot tandem independently and hold 30 seconds.   X_ (3) Able to place foot ahead of other independently and hold 30 seconds.   _ (2) Able to take small step independently and hold 30 seconds.   _ (1) Needs help to step but can hold 15 seconds.   _ (0) Loses balance while stepping or standing.     14. Standing on one leg   Instruction: Stand on one leg as long as you can independently.   _ (4) Able to lift leg independently and hold >10 seconds.   _ (3) Able to lift leg independently and hold 5-10 seconds.   _ (2) Able to lift leg independently and hold = or > 3 seconds.   X_ (1) Tries to lift leg; unable to hold 3 seconds but remains standing independently.   _ (0) Unable to try or needs assist to prevent fall.  Total Score: _44 /56    5 times sit to stand test:  18.70_ seconds with arms crossed   Level of assistance:  unable to perform initially, cues for counter balancing hips and shoulders with excellent carryover  Quality of movement:  good ballistic concentric and  eccentric control.      Timed up and Go:  (Rollator, good control on the turns, delayed turn at the chair, no shuffling with controlled sit)    Trial Time   Trial 1 18.15 Seconds   Trial 2 17.02 Seconds   Trial 3 15.85 Seconds       Average time: _17.01 seconds    10 meter walk test:  11.10_ seconds   Device used: rollator  Deviations noted:  adequate step length with foot flat at initial contact, forward head sustained but improved upright posture  Freezing episodes:  none   Arm swing:  --      Education:         Moving with Parkinsons Disease    Focusing on Feeling  When you try to focus on two things at once, your brain mostly pays attention to thinking and analyzing rather than feeling and moving. This is really important to know if you want to control your body better, especially if you have trouble moving because of a condition.  Your brain likes to think about the past and the future  a lot. But this can make it hard to feel what your body is doing right now. And feeling what your body is doing right now is the only way you can change it.     Posture and Balance  If someone has Parkinson's disease, they might have problems with their posture and balance. But everyone's posture is a little different because of their habits and past experiences. If someone leans forward too much, for example, they might feel off-balance and start shuffling their feet when they walk.  To fix this, they need to pay attention to their body right now. If they notice they're leaning too far forward, they can try to stand up straighter. It's like finding a balance point. If they're falling forward to the right, they need to correct their posture by leaning back and to the left.     Recognizing and Redirecting  It's important to understand that being aware of your body in the present moment helps you control it better. People with Parkinson's disease often have trouble with this because the condition affects their ability to feel  their body. But with practice, they can get better at it.  When you catch yourself not paying attention to your body and feeling distracted, you can bring your focus back to your posture. Every time you do this, it's like training your brain to pay attention to your body more. Eventually, it becomes a habit, and you'll automatically correct your posture without even thinking about it.  It's okay if you don't get it right the first time. Making mistakes is part of learning. Every time you notice that you're not paying attention to your body and then fix it, that's a success. Don't think of it as failing, because that will just make it harder to improve. Just keep practicing, and over time, you'll get better at it.              The Mind-Body Connection: Understanding Parkinson's Disease    How the Mind Affects the Body:  Our mind tends to focus on past experiences or worry about the future.  Everyone has habits and thoughts based on their own life experiences.  People with Parkinson's disease often find it hard to focus on more than one thing at a time. This means that their mind might get distracted, affecting their movements.  Common Issues in Parkinson's Disease:  When someone with Parkinson's has trouble moving, like freezing or falling, they might think it's just part of the disease. But sometimes, it's because their mind was distracted by their thoughts instead of focusing on their body.  Parkinson's is often called a progressive disease, meaning it gets worse over time. This can make people feel like they have no control over their health. But this isn't always true! New research shows that there is a connection between the mind and body that can help people improve.  How Negative Thoughts Affect the Body:  If someone with Parkinson's has a bad night's sleep, they might wake up feeling tired and worry that the disease is getting worse. This worry can make them feel even worse and affect how their body  works.  Negative thoughts can cause the body to release stress hormones. These hormones can make the symptoms of Parkinson's worse, creating a cycle of feeling bad and losing control over the body.  Breaking the Cycle:  Learning to pay attention to the body can help regain control.  For example, when someone with Parkinson's is  about to sit down, they might think about sitting before their body is ready. This can lead to poor balance and even falls. But by focusing on where their body is at the moment, they can prevent these problems.  Practicing being aware of the body in different situations, like walking to the car or turning in bed, can help break old habits and build new, healthier ones.  Using Breathing to Calm the Body:  Deep breathing, especially focusing on a long exhale, can help calm the body. This is because it activates a part of the nervous system that helps the body relax and heal.  It's important not to overthink breathing, as it can make anxiety worse. Instead, try to notice how the body relaxes with each breath, which can help reduce muscle tension and stress.  The Positive Side of the Mind-Body Connection:  When people learn to control their body, it can make them feel happier and more grateful. This positive feeling can create a cycle of better health and more control over the body.  With practice, these positive changes can actually change how the brain works, leading to even more improvement over time.    This handout explains how the mind and body are connected, especially for people with Parkinson's disease. By becoming more aware of the body and using simple techniques like deep breathing, it's possible to regain some control and improve overall health.    Diaphragmatic breathing, also known as deep breathing or abdominal breathing, involves fully engaging the diaphragm while breathing deeply into the abdomen rather than shallow chest breathing. This type of breathing has numerous  physiological benefits, particularly in its effect on the parasympathetic nervous system (PNS), which is responsible for the body's rest and digest functions. Heres how diaphragmatic breathing can benefit the body and activate the PNS:   1. Activation of the Parasympathetic Nervous System (PNS):   The PNS is responsible for slowing the heart rate, lowering blood pressure, and promoting relaxation and recovery. Diaphragmatic breathing helps activate this system by stimulating the vagus nerve, a key component of the parasympathetic response.   The vagus nerve runs from the brainstem to the abdomen and is involved in controlling heart rate and digestion. When you breathe deeply, it sends signals to the brain that promote a state of calmness and relaxation.   By engaging the diaphragm, which is a large muscle that moves downward during deep breathing, the body is encouraged to relax rather than stay in a heightened, fight-or-flight state (sympathetic nervous system activation).   2. Reduced Heart Rate and Blood Pressure:   Diaphragmatic breathing has been shown to help lower heart rate and blood pressure, both of which are elevated during periods of stress or anxiety. By stimulating the PNS, the body naturally enters a state of lower arousal, which helps to reduce these physiological markers.   Studies have found that deep breathing can also lead to increased heart rate variability (HRV), which is a marker of autonomic nervous system health. Higher HRV is associated with greater flexibility in responding to stress and a healthier PNS.   3. Improved Oxygen Exchange:   Deep breathing allows the lungs to fill more fully with air, which enhances oxygen exchange and the elimination of carbon dioxide. This increased efficiency in gas exchange helps improve overall oxygen delivery to tissues, supports cellular energy production, and promotes better function of organs.   Diaphragmatic breathing also helps ensure that the  lower lobes of the lungs--which are underused during  shallow chest breathing--are fully engaged, improving lung capacity and efficiency.   4. Reduced Stress and Anxiety:   When you breathe deeply, your body shifts from the stress-driven sympathetic state to a more relaxed parasympathetic state. This shift can reduce the production of stress hormones such as cortisol and adrenaline, leading to a greater sense of calm.   This effect can be especially beneficial in managing chronic stress, anxiety, and even post-traumatic stress disorder (PTSD). Mindfulness-based breathing exercises that include diaphragmatic breathing are often used in therapy to help regulate emotional responses.   5. Improved Digestive Function:   The PNS plays a key role in promoting digestion. When diaphragmatic breathing is practiced, it helps stimulate the digestive organs and encourages peristalsis (the wave-like motion of muscles in the digestive tract). This results in better digestion and a reduction in symptoms like bloating and constipation.   The increase in blood flow to the abdominal organs due to deep breathing helps improve nutrient absorption and overall gastrointestinal health.   6. Muscle Relaxation:   Diaphragmatic breathing promotes muscle relaxation. As the body enters a parasympathetic state, muscle tension tends to decrease, which can relieve chronic muscle pain, headaches, or neck stiffness caused by stress.   The act of deep breathing itself can create a soothing effect on the body and mind, aiding in the relaxation of both skeletal and smooth muscles.   7. Enhancing Mental Clarity and Focus:   Since diaphragmatic breathing activates the PNS, it also helps the brain shift from a state of agitation (sympathetic activation) to a more calm, focused state. This can improve cognitive performance, decision-making, and mental clarity.   Mindfulness and meditation practices often incorporate diaphragmatic breathing as it supports a  state of alert relaxation, which is conducive to mental clarity and focus.   Summary of Benefits:   Activates the parasympathetic nervous system, inducing a relaxation response.   Reduces heart rate and blood pressure, improving cardiovascular health.   Increases oxygen exchange and promotes better lung function.   Relieves stress and anxiety, helping to regulate emotional responses.   Improves digestive function and reduces symptoms like bloating and constipation.   Promotes muscle relaxation, reducing tension and discomfort.   Enhances focus and mental clarity, improving cognitive functions.   Overall, diaphragmatic breathing is a simple yet powerful technique that supports both mental and physical well-being by promoting parasympathetic activation and a state of relaxation and recovery.     The present moment refers to the here and now -- the ongoing instant that is happening right now, without being clouded by past memories or future anticipations. It's the only time we truly have control over, yet it's often the most overlooked. The past is gone, and the future hasnt arrived, so the present moment is where we live, act, and experience life.   Living in the present is associated with mindfulness, which means paying full attention to what's happening, without judgment or distraction. Being present allows us  to fully engage with our experiences, leading to reduced stress, greater enjoyment, and improved emotional well-being.   Why Its Important to Stay in the Present Moment:   Reduces Anxiety and Stress: Much of our anxiety comes from worrying about the future or ruminating about the past. Focusing on the present helps minimize these feelings.   Increases Happiness and Fulfillment: By savoring the current moment, we are more likely to feel content and appreciative of what we have right now.   Improves Relationships: Being fully present with others fosters  deeper connection and understanding.   Enhances Focus and  Productivity: Concentrating on the task at hand, instead of multitasking or letting your mind wander, boosts efficiency.   Strategies to Stay in the Present Moment:   Mindful Breathing:   Focus on your breath to center yourself in the present. When you notice your mind wandering, gently bring it back to the sensation of your breath. The simple act of paying attention to your inhale and exhale can help anchor you in the present moment.   Body Scan:   A body scan involves mentally scanning your body from head to toe, paying attention to the physical sensations. This helps bring your awareness to the present and can help release tension.   Practice Gratitude:   Regularly practicing gratitude for small moments and simple things in your life can help you stay grounded in the present. This can include appreciating the food you're eating, the beauty of nature, or the people around you.      Engage Fully in Activities:   When you are engaged in an activity, immerse yourself completely in it. Whether it's reading, cooking, exercising, or even just walking, give it your full attention. Let go of distractions like phones or thoughts about the past or future.   Mindful Listening:   When interacting with others, focus entirely on listening to what they are saying, without preparing your response or judging them. This helps deepen your connection and keeps you in the present.   Limit Multitasking:   Multitasking can scatter your focus and pull you away from the present. Try to focus on one task at a time. Being fully involved in each task helps you maintain mindfulness.   Use Anchors:   You can set reminders throughout the day to bring yourself back to the present moment. This can be as simple as setting a timer to remind you to check in with your breath, or noticing the sensation of your feet on the ground as a physical reminder to stay grounded.   Meditation:   Regular meditation can train your mind to stay in the present  moment. Simple practices like focusing on your breath or using guided meditations help calm your mind and foster mindfulness.   Savor the Moment:   Instead of rushing through experiences, pause and appreciate the details. Whether its enjoying a meal, a conversation, or a beautiful sunset, take time to fully experience it.   Accept the Present:   Sometimes we try to avoid or escape discomfort, but practicing acceptance of whatever is happening in the moment can help you stay grounded. This doesnt mean you have to like everything, but embracing it as part of your experience can help you remain present.   Overcoming Common Challenges:   Distracting Thoughts: It's natural for your mind to wander. When this happens, gently guide your focus back to the present. Practice self-compassion, as the goal isn't to eliminate distractions but to return to the present each time.   Emotional Overwhelm: If you feel overwhelmed by your emotions, recognize them without judgment. Try to experience them fully without letting them control you. Mindfulness-based strategies like grounding exercises can help.   External Distractions: Set boundaries with your environment. If possible, create spaces free of distractions when you need to focus or be mindful.   Staying in the present is a skill that takes practice. The more you practice mindfulness and self-awareness, the easier it becomes to live in the now.  The Correlation Between Posture and Balance      Postural alignment and a persons balance are directly related from a weight distribution stand point.  When standing, if someone were to use a piece of chalk to outline the borders of ones feet, there would be a box on the floor.  Centering the body over top of the box keeps a person balanced and in control.  We have about 2 inches of wiggle room to shift our weight around the borders of the box and still stay balanced.  While standing, notice the pressure of the feet on the ground.   That is the target.     When a person is standing tall, the shoulders are stacked over the hips and their weight is distributed over their feet, within the borders of that box.  To take a step, all of the weight is shifted over one leg to advance the other.  If someone was taking photos as a person walked, at one point in time only one leg would be on the ground.  Now the persons weight is stacked over a smaller box.     When someone has difficulty with balance, the above weight shifting principles are compromised.  The two main areas for a person are the shoulders and hips, due to the amount of weight associated with those areas.  If a person develops the habit of leaning forward when standing, their balance point becomes compromised.  The shoulders and hips counter-balance each other and are the areas to be aware of.  If a person is falling forward, the shoulders typically need to come back.  If the person is falling backward, typically the hips need to come forward to center back over the box (feet).     Our bodies have three balance centers: our eyes, our inner ear which tells our brain where our head is in space and knowing where our bodies are in space.  Most people rely on vision for balance.  Using the sense of where your weight is distributed over your feet, knowing where your body is in space by feeling it are keys to rediscovering control over ones balance.     Recognizing movement and postural habits that have compromised ones balance, then redirecting themselves to new habits through repetition plays a major role in restoring balance control.       How the Fear of Falling Can Lead to Falling        The fear of falling can increase the likelihood of actually falling due to a combination of psychological and physical factors. Heres how this works:   1. Muscle Tension and Reduced Mobility   When a person is afraid of falling, they may subconsciously tighten their muscles, especially in the legs and  core. This muscle tension can reduce their flexibility and coordination, making it harder to move smoothly and with balance. Tight muscles are less responsive to sudden shifts in movement, which increases the risk of a fall.   2. Decreased Confidence and Caution   Fear of falling often leads to over-cautious behavior. People might hesitate or move more slowly than necessary, which can disrupt their natural movement patterns. This over-cautiousness might result in them misjudging a step or awkwardly shifting their weight, leading to a fall.   3. Increased Focus on Negative Outcomes   The fear of falling often causes people to focus intensely on the potential for falling. This heightened focus on the negative outcome can distract them  from other important aspects of balance, like posture and environment. When people are overly fixated on avoiding a fall, they may miss subtle cues or signals that help them maintain balance.   4. Avoidance of Necessary Activity   People who fear falling may begin to avoid certain activities or physical movements, leading to a loss of strength, balance, and coordination over time. As muscles weaken and the body becomes less accustomed to movement, the risk of falling increases even when engaging in everyday activities.   5. Changes in Walking Patterns   Fear of falling can cause people to change the way they walk or move. For example, they may take smaller, shuffling steps or look down constantly to watch their feet, both of which can compromise their posture and balance. These altered walking patterns can make it more difficult to react quickly to unexpected changes in their environment, such as tripping or losing their footing.   6. Heightened Stress Response   Fear activates the body's stress response, releasing adrenaline and cortisol. While these hormones are meant to help in emergency situations, they can actually impair balance and coordination when present in excess. The body  might be in a fight or flight mode, which can lead to shaky movements or erratic responses, making a fall more likely.   In short, while the fear of falling is a natural response to a perceived danger, it can paradoxically contribute to the very outcome people fear by impairing physical and psychological functioning that helps maintain balance and coordination.   HEP:  Access Code: [email or web address]  URL: [email or web address]  Date: 11/03/2024  Prepared by: Sidra Sink    Exercises  - Supine Diaphragmatic Breathing  - 1-3 x daily - 7 x weekly - 3 sets - 10 reps  - Supine Transversus Abdominis Bracing - Hands on Stomach  - 1 x daily - 7 x weekly - 2-3 sets - 10 reps - 5 hold  - Seated Diaphragmatic Breathing  - 1-3 x daily - 7 x weekly - 3 sets - 10 reps  - Seated Scapular Retraction  - 1 x daily - 7 x weekly - 2-3 sets - 10 reps  - Seated Transversus Abdominis Bracing  - 1 x daily - 7 x weekly - 2-3 sets - 10 reps - 5 hold  - Standing Anatomical Position with Scapular Retraction and Depression at Wall  - 1 x daily - 7 x weekly - 2-3 sets - 10 reps  - Standing Transverse Abdominis Contraction  - 1 x daily - 7 x weekly - 2-3 sets - 10 reps - 5 hold  - Standing Diaphragmatic Breathing  - 1-3 x daily - 7 x weekly - 3 sets - 10 reps  - Standing Gluteal Sets  - 1 x daily - 7 x weekly - 2-3 sets - 10 reps - 5 hold  - Standing Weight Shift  - 1-3 x daily - 7 x weekly - 3 sets - 20 reps              Assessment: Jacob Santiago had an excellent response to the education provided and carried over into his testing.  It was brought to his attention that he adapted his postural alignment and movement capabilities without the influence of medication, as he needed to take his medication when he got back to the treatment room.  Recognizing his ability to identify maladaptive patterns, followed by redirecting himself to the new approach was the plan outlined.  TEST 05/24/24 11/02/24   Jacob Santiago balance 39/56 44/56   Timed up and Go 25.14  seconds 17.01 seconds   5x sit to stand 18.59 seconds 18.70 seconds   10 meter walk test 12.27 seconds 11.10 seconds       Recommendations:  Perform 150 minutes of exercise per week and incorporate somatosensory awareness into daily routines.      PLAN: Plan to follow up in 6 months. Please do not hesitate to contact me with any questions or concerns.

## 2024-11-06 ENCOUNTER — Other Ambulatory Visit: Payer: Self-pay

## 2024-11-07 ENCOUNTER — Ambulatory Visit

## 2024-11-07 ENCOUNTER — Encounter (HOSPITAL_BASED_OUTPATIENT_CLINIC_OR_DEPARTMENT_OTHER): Payer: Self-pay

## 2024-11-07 VITALS — BP 104/58 | HR 66 | Temp 97.7°F | Ht 68.0 in | Wt 212.0 lb

## 2024-11-07 DIAGNOSIS — I8500 Esophageal varices without bleeding: Secondary | ICD-10-CM | POA: Insufficient documentation

## 2024-11-07 DIAGNOSIS — K219 Gastro-esophageal reflux disease without esophagitis: Secondary | ICD-10-CM | POA: Insufficient documentation

## 2024-11-07 DIAGNOSIS — E559 Vitamin D deficiency, unspecified: Secondary | ICD-10-CM | POA: Insufficient documentation

## 2024-11-07 DIAGNOSIS — K760 Fatty (change of) liver, not elsewhere classified: Secondary | ICD-10-CM

## 2024-11-07 DIAGNOSIS — G20A1 Parkinson's disease without dyskinesia, without mention of fluctuations: Secondary | ICD-10-CM | POA: Insufficient documentation

## 2024-11-07 DIAGNOSIS — R161 Splenomegaly, not elsewhere classified: Secondary | ICD-10-CM

## 2024-11-07 DIAGNOSIS — K766 Portal hypertension: Secondary | ICD-10-CM

## 2024-11-07 DIAGNOSIS — K7460 Unspecified cirrhosis of liver: Secondary | ICD-10-CM | POA: Insufficient documentation

## 2024-11-07 DIAGNOSIS — D696 Thrombocytopenia, unspecified: Secondary | ICD-10-CM | POA: Insufficient documentation

## 2024-11-07 DIAGNOSIS — Z8639 Personal history of other endocrine, nutritional and metabolic disease: Secondary | ICD-10-CM | POA: Insufficient documentation

## 2024-11-07 DIAGNOSIS — I8510 Secondary esophageal varices without bleeding: Secondary | ICD-10-CM

## 2024-11-07 DIAGNOSIS — K7682 Hepatic encephalopathy: Secondary | ICD-10-CM | POA: Insufficient documentation

## 2024-11-07 MED ORDER — PANTOPRAZOLE 40 MG TABLET,DELAYED RELEASE: 40.0000 mg | DELAYED_RELEASE_TABLET | Freq: Two times a day (BID) | ORAL | 3 refills | Status: AC

## 2024-11-07 NOTE — Progress Notes (Signed)
 [facility]                                                      Department of Gastroenterology  [address]  [place], [place] [address]    Date:   11/07/2024  Name: Jacob Santiago  Age: 69 y.o.    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, thrombocytopenia      Clinic visit   Feb-12-24   =================  Patient is here for new visit   Patient has history of fatty liver disease, nonalcoholic  Denies any current or previous alcohol drinking  No family history of liver disease or GI cancer  Was diagnosed with liver cirrhosis around 10 years ago, he was following with previous GI doctor who is retired now  Patient lives 3 hours from here  Last EGD long time ago showed varices, no banding, no report available  Colonoscopy in 2020, denies any previous polyps, no family history of colon cancer, no report available, patient due for surveillance    Has thrombocytopenia thrombocytopenia,   Heartburn controlled with Protonix   Patient taking meloxicam as needed that was stopped recently  Patient on losartan, instructed to stop  Patient taking carvedilol  6.125 mg b.i.d.    No previous ascites or paracentesis, no previous variceal bleeding  Has history of elevated ammonia and hepatic encephalopathy, he ran out of lactulose     The patient denies any  difficulty with swallowing, abdominal pain, nausea, vomiting, abdominal bloating, constipation,  diarrhea, rectal bleeding, melena, hematemesis, unintentional weight loss or other complaints.    Clinic visit  August-1-24    =================  Patient is here for follow up   Needs refill for Carafate alone  Heartburn controlled with Protonix   No signs of bleeding, no recurrence of encephalopathy, no signs of ascites  Underwent EGD colonoscopy June 2024 showed grade 1 esophageal varices not amenable for banding, no esophagitis, moderate portal hypertensive gastropathy, no gastric varices, unremarkable stomach biopsy negative for H pylori    Colonoscopy with good prep showed hyperplastic polyps, nonbleeding internal and external hemorrhoids, recommended to repeat after 7 years    Chronic liver disease workup February 2024 showed unremarkable alpha-1 antitrypsin, ceruloplasmin, mitochondrial antibody, smooth muscle antibody, iron studies, acute hepatitis panel, celiac disease screening.  Patient immune to hepatitis-A and B    Ultrasound March 2024 showed no liver lesion, evidence of splenomegaly, cirrhosis AFP February 2024 unremarkable    The patient denies any  difficulty with swallowing, abdominal pain, nausea, vomiting, abdominal bloating, constipation, diarrhea, rectal bleeding, melena, hematemesis, unintentional weight loss or other complaints.    Clinic Visit on 03/06/2024:  Jacob Santiago is here today for follow-up.  Patient denies any GI symptoms or concerns at this time.  Gastroesophageal reflux disease is  well controlled with PPIs.  Most recent ultrasound in 12/2023 showed portal vein for detected but could not exclude thrombosis or severely diminished flow secondary to portal hypertension.  CT venogram on 01/18/2024 showed no evidence of portal vein thrombosis.    Clinic Visit on 11/07/2024:  Jacob Santiago is here for follow up. No acute changes since last visit. GERD is well controlled with PPI's. No signs of bleeding or ascites.  Underwent EGD on 04/05/2024 as detailed below.  One esophageal varices was  found, not amenable for banding.  No gastric varices.    Review of Systems  See HPI.  No chest pain or sob  No fever or chills     Past Medical History: He  has a past medical history of BPH (benign prostatic hyperplasia), CPAP (continuous positive airway pressure) dependence, Esophageal reflux, Essential hypertension, Hepatic cirrhosis, Nonalcoholic fatty liver disease, Parkinson's disease, Sleep apnea, and Thrombocytopenia, unspecified (CMS HCC).      Past Surgical History: He  has a past surgical history that includes hx hernia repair (Right); hx hand surgery; hx colonoscopy; colonoscopy (04/06/2023); and hx lobectomy (Left, 09/29/2023).    Social History: His  reports that he has never smoked. He has been exposed to tobacco smoke. He has never used smokeless tobacco. He reports that he does not drink alcohol and does not use drugs., ,     Family History: He family history is not on file.    Allergies: He is allergic to nirmatrelvir-ritonavir and simvastatin.    Home Medication:   Current Outpatient Medications   Medication Sig    carbidopa -levodopa  (SINEMET  CR) 50-200 mg Oral Tablet Sustained Release TAKE 1 TABLET EVERY MORNING AND TAKE 2 TABLETS EVERY EVENING    carbidopa -Levodopa  (SINEMET ) 25-100 mg Oral Tablet Take 3 tablets by mouth at 6 am, 3 tablets at 10 am, 4 tablets at 1-1:30 pm, and 4 tablets at 5 pm    carvediloL  (COREG ) 6.25 mg Oral Tablet TAKE ONE TABLET (6.25 MG total) BY MOUTH TWICE DAILY    Cholecalciferol, Vitamin D3, (VITAMIN D -3) 50 mcg (2,000 unit) Oral Capsule Take by mouth    ergocalciferol , vitamin D2, (VITAMIN D ) 1,250 mcg (50,000 unit) Oral Capsule Take 1 Capsule (50,000 Units total) by mouth Every 7 days for 123 doses Indications: low vitamin D  levels (Patient not taking: Reported on 11/07/2024)    ketoconazole (NIZORAL) 2 % Cream Apply topically Daily    lactulose  (ENULOSE ) 10 gram/15 mL Oral Solution take 30ml by MOUTH THREE TIMES DAILY    MYRBETRIQ 25 mg Oral Tablet Sustained Release  24 hr TAKE ONE TABLET BY MOUTH EVERY DAY SWALLOWING WHOLE WITH WATER, DO NOT CRUSH CHEW AND OR DIVIDE    spironolactone (ALDACTONE) 50 mg Oral Tablet Take 1 Tablet (50 mg total) by mouth Daily    tamsulosin (FLOMAX) 0.4 mg Oral Capsule Take 1 capsule every day by oral route.    traZODone  (DESYREL ) 50 mg Oral Tablet Take 1 Tablet (50 mg total) by mouth Every night       Examination:  BP (!) 104/58 (Site: Right Arm, Patient Position: Sitting, Cuff Size: Adult Large)   Pulse 66   Temp 36.5 C (97.7 F) (Temporal)   Ht 1.727 m (5' 8)   Wt 96.2 kg (212 lb)   SpO2 96%   BMI 32.23 kg/m        Wt Readings from Last 3 Encounters:   11/07/24 96.2 kg (212 lb)   11/02/24 96.5 kg (212 lb 12.8 oz)  05/24/24 101 kg (222 lb 12.8 oz)       General: Resting comfortably, no acute distress.  Eyes: Conjunctiva normal, sclera non-icteric.  HENT: Atraumatic and normocephalic.   Neck:  Supple.   Lungs: Breathing comfortably.  No respiratory distress.  Abdomen: soft lax, not tender   Extremities: No cyanosis or edema  Neurologic: Alert, Oriented, Grossly normal  Psychiatric: Appears normal    Data/Chart/Labs/Imaging reviewed:  -CBC January 2024 WBC 3.4, hemoglobin 13.8, platelets 80  -INR January 1024 was 1.34  -BMP January 2024 unremarkable  -liver panel January 2024 total bilirubin 1.9, AST 49, otherwise unremarkable  -ammonia level January 2024 140  -A1c January 2024 5.9  -hepatitis panel in January 2024 showed positive hepatitis-A IgG, negative hepatitis-B surface antigen, negative hepatitis-c antibody, positive hepatitis-B surface antibody  -modified barium swallow in May 2022 showed no hiatal hernia, no evidence of gastroesophageal reflux disease, few tubular filling defect in the lower 3rd of the esophagus compatible with esophageal varices, mild esophageal dysmotility    -EGD April 06, 2023 showed grade 1 esophageal varices in the distal esophagus without stigmata, not amenable for banding.  Regular Z-line at 40 cm, no  esophagitis.  Moderate portal hypertensive gastropathy, no gastric varices, normal retroflexion, normal bulb and 2nd portion of duodenum, unremarkable stomach biopsy negative for H pylori    -colonoscopy June 11th 2024 with adequate prep showed nonthrombosed external hemorrhoids on perianal exam, 5 mm polyp was removed, nonbleeding internal and external hemorrhoids, pathology showed hyperplastic polyp, recommended to repeat colonoscopy after 7 years    Abdominal ultrasound on 01/12/2024:    Cirrhosis.  Portal vein flow detected.  Cannot exclude thrombosis or severely diminished flow secondary to portal hypertension.  Cholelithiasis and probable gallbladder sludge without evidence of acute cholecystitis.    CT venogram AP on 01/18/2024:  Cirrhotic liver with large varices in the left upper and mid abdomen.  No evidence of portal vein thrombosis.  Cholelithiasis.    Labs on 01/19/2023:  Remarkable for WBC 2.4, platelet count 71, RBC 3.98, creatinine 0.74, albumin 3.2, AST 48    -Chronic liver disease workup February 2024 showed unremarkable alpha-1 antitrypsin, ceruloplasmin, mitochondrial antibody, smooth muscle antibody, iron studies, acute hepatitis panel, celiac disease screening.  Patient immune to hepatitis-A and B    EGD on 04/05/2024:  Normal upper and middle 3rd of esophagus.  Grade 1 and small less than 5 mm esophageal varices with no bleeding and no stigmata of recent bleeding.  Not amenable for banding due to small size.  No esophagitis.  Bilious gastric fluid, fluid aspiration performed.  Erythematous mucosa in the stomach.  No specimens collected.  Moderate portal hypertensive gastropathy, no gastric varices.  Normal duodenum.     Labs on 08/31/2024:  Remarkable for WBC 3.5, hemoglobin 13.1, platelet count 77, MCV 100.3, creatinine 0.56, albumin 3.3, total bilirubin 2.5, AST 55    Right upper quadrant ultrasound on 07/10/2024:  Cirrhotic liver.  No evidence of masses.  Cholelithiasis without evidence of  cholecystitis.  Otherwise unremarkable.    _________________________________________________________________________  Assessment and Plan  Rylen Swindler is a 69 y.o. 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  Repeat EGD on 04/05/2024 again showed grade 1 esophageal varices with no bleeding and no stigmata  of recent bleeding.  Not amenable for banding due to small size.  Up-to-date for [facility] screening.  Most recent abdominal ultrasound on 01/12/2023 showed portal vein flow but could not exclude thrombosis or severely diminished flow secondary to portal hypertension. CT venogram AP on 01/18/2024 showed no evidence of portal vein thrombosis. Repeat RUQ ultrasound on 07/10/2024 showed cirrhotic liver and no evidence of masses.   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  Most recent platelet count in 08/2024 was 77.  Colon cancer screening due, last colonoscopy in 2020 was without polyps, no report available, no family history of colon cancer repeated colonoscopy with good prep June 2024 with small hyperplastic polyp removed, next colonoscopy due June 2031  Vitamin-D deficiency-treated for 12 weeks.  Repeat vitamin-D level in 06/2024 was 26.2        My recommendations are as follows:    -I personally reviewed previous GI workup/procedures/labs and relevant imaging as stated above  Repeat EGD June 2026 for surveillance, ordered.  Next colonoscopy due June 2031  Immune to hepatitis-A and B  Continue HCC screening with ultrasound and AFP every 6 months, ordered.  Due in  12/2024.  Unremarkable screening tests for alpha-1 antitrypsin, ceruloplasmin, mitochondrial antibody, smooth muscle antibody, tTG IgA, total IgA level, iron studies, viral hepatitis panel.  Continue vitamin-D 2000 IU daily.  Check CBC, BMP, LFTs and INR  Will continue lactulose  t.i.d., titrate to 3 bowel movements per day.  Per patient losartan was discontinued in the setting of cirrhosis.  Avoid NSAIDs  Continue Protonix  40 mg p.o. daily.  Take it 30 minutes before breakfast.  May take additional dose 30 minutes before dinner as needed for uncontrolled heartburn.  Refilled.  Continue carvedilol  6.25 mg p.o. b.i.d. for primary prophylaxis of variceal bleeding  I have discussed the risks and benefits of EGD.  Risks include bleeding, infection, perforation and risk of anesthesia.  Patient verbalized understanding and is willing to move forward.    Education Regarding Cirrhosis  - The etiology and natural history of cirrhosis was discussed with the patient.  This included the risk of progression to complications of end stage liver disease.  - Good liver care was discussed with the patient.  This includes avoiding hepatotoxic substances.  Specifically, no more than 2g of tylenol should be consumed a day.      Cirrhosis Guidelines:     Tylenol (acetaminophen) not to exceed 2 gms or 4 extra strength tablets in a 24 hour period.  Check all medications - many combination products contain acetaminophen.  If possible, avoid all aspirin and nonsteroidal products (Aleve, Motrin, Celebrex, etc.)  No herbal products.  No raw seafood or hamburger.  Avoid skin contact with sea water or beach sand in warm months.  Complete abstinence from all alcohol and illegal drugs.  No smoking.  Multivitamin and calcium (1000-1500 mg) with vitamin D  daily.  2 gram sodium diet.  This means avoid very salty foods such as canned soup and pickled goods.  Do not use salt substitute, but Mrs. Dash and lemon juice are safe.  Cirrhosis complications  include liver failure and liver cancer.  You should have regular medical check ups and screening for liver cancer (blood test and scan) every three to six months.  Notify us  immediately if you have any of the following  Eyes turn yellow  You become confused  Your  belly starts to swell  You vomit blood (you should go to the nearest emergency room)      The patient was given the opportunity to ask questions and those questions were appropriately answered. The patient agreed with the treatment plan and is encouraged to call with any additional questions or concerns.     Return to the clinic in 6 months    Orders Placed This Encounter    UPPER ENDOSCOPY [facility])    CBC/DIFF    BASIC METABOLIC PANEL    HEPATIC FUNCTION PANEL    PT/INR    ALPHA FETOPROTEIN (AFP) TUMOR MARKER    pantoprazole  (PROTONIX ) 40 mg Oral Tablet, Delayed Release (E.C.)       Lacy File, APRN,NP-C  [facility] - Gastroenterology  [place], [place]  [address]    Attending attestation    I did not personally see or examine the patient.The patient was independently seen and examined by  Lacy File, APRN,NP-C.  The assessment, plan of care, and recommendations were formulated by Lacy File, APRN,NP-C.       Camille Hobby, MD  [facility]  [place], [place] [address]      Note: A portion of this documentation was generated using MMODAL (voice recognition software) and may contain syntax/voice recognition errors.

## 2024-11-14 ENCOUNTER — Encounter (HOSPITAL_BASED_OUTPATIENT_CLINIC_OR_DEPARTMENT_OTHER): Payer: Self-pay | Admitting: Student in an Organized Health Care Education/Training Program

## 2025-01-08 ENCOUNTER — Ambulatory Visit (HOSPITAL_COMMUNITY): Payer: Self-pay | Admitting: Radiology

## 2025-01-08 ENCOUNTER — Ambulatory Visit (INDEPENDENT_AMBULATORY_CARE_PROVIDER_SITE_OTHER): Payer: Self-pay

## 2025-02-06 ENCOUNTER — Ambulatory Visit (INDEPENDENT_AMBULATORY_CARE_PROVIDER_SITE_OTHER): Payer: Self-pay | Admitting: Rehabilitative and Restorative Service Providers"

## 2025-02-06 ENCOUNTER — Ambulatory Visit (INDEPENDENT_AMBULATORY_CARE_PROVIDER_SITE_OTHER): Payer: Self-pay | Admitting: Neurology

## 2025-03-27 ENCOUNTER — Encounter (HOSPITAL_COMMUNITY): Payer: Self-pay

## 2025-03-27 ENCOUNTER — Ambulatory Visit: Admit: 2025-03-27 | Admitting: Student in an Organized Health Care Education/Training Program

## 2025-03-27 SURGERY — GASTROSCOPY
Anesthesia: Monitor Anesthesia Care

## 2025-04-10 ENCOUNTER — Ambulatory Visit (INDEPENDENT_AMBULATORY_CARE_PROVIDER_SITE_OTHER): Payer: Self-pay | Admitting: NURSE PRACTITIONER
